# Patient Record
Sex: Female | Born: 1935 | Race: White | Hispanic: No | Smoking: Never smoker
Health system: Southern US, Community
[De-identification: ages and names within clinical notes are randomized; demographics above are authoritative.]

## PROBLEM LIST (undated history)

## (undated) DIAGNOSIS — K2270 Barrett's esophagus without dysplasia: Secondary | ICD-10-CM

## (undated) DIAGNOSIS — M1990 Unspecified osteoarthritis, unspecified site: Secondary | ICD-10-CM

## (undated) DIAGNOSIS — K219 Gastro-esophageal reflux disease without esophagitis: Secondary | ICD-10-CM

## (undated) DIAGNOSIS — E785 Hyperlipidemia, unspecified: Secondary | ICD-10-CM

## (undated) DIAGNOSIS — N809 Endometriosis, unspecified: Secondary | ICD-10-CM

## (undated) DIAGNOSIS — Z8619 Personal history of other infectious and parasitic diseases: Secondary | ICD-10-CM

## (undated) DIAGNOSIS — I10 Essential (primary) hypertension: Secondary | ICD-10-CM

## (undated) DIAGNOSIS — J449 Chronic obstructive pulmonary disease, unspecified: Secondary | ICD-10-CM

## (undated) HISTORY — PX: NASAL SEPTUM SURGERY: SHX37

## (undated) HISTORY — DX: Endometriosis, unspecified: N80.9

## (undated) HISTORY — DX: Barrett's esophagus without dysplasia: K22.70

## (undated) HISTORY — DX: Unspecified osteoarthritis, unspecified site: M19.90

## (undated) HISTORY — PX: VESICOVAGINAL FISTULA CLOSURE W/ TAH: SUR271

## (undated) HISTORY — DX: Gastro-esophageal reflux disease without esophagitis: K21.9

## (undated) HISTORY — DX: Personal history of other infectious and parasitic diseases: Z86.19

## (undated) HISTORY — PX: TRACHEOSTOMY: SUR1362

## (undated) HISTORY — DX: Hyperlipidemia, unspecified: E78.5

## (undated) HISTORY — DX: Chronic obstructive pulmonary disease, unspecified: J44.9

## (undated) HISTORY — DX: Essential (primary) hypertension: I10

## (undated) HISTORY — PX: APPENDECTOMY: SHX54

---

## 1977-08-29 HISTORY — PX: ABDOMINAL HYSTERECTOMY: SHX81

## 1993-08-29 HISTORY — PX: CHOLECYSTECTOMY: SHX55

## 1998-07-22 ENCOUNTER — Ambulatory Visit (HOSPITAL_COMMUNITY): Admission: RE | Admit: 1998-07-22 | Discharge: 1998-07-22 | Payer: Self-pay | Admitting: Family Medicine

## 1998-07-22 ENCOUNTER — Encounter: Payer: Self-pay | Admitting: Family Medicine

## 1999-09-14 ENCOUNTER — Encounter: Payer: Self-pay | Admitting: Family Medicine

## 1999-09-14 ENCOUNTER — Ambulatory Visit (HOSPITAL_COMMUNITY): Admission: RE | Admit: 1999-09-14 | Discharge: 1999-09-14 | Payer: Self-pay | Admitting: Family Medicine

## 2000-10-03 ENCOUNTER — Encounter: Payer: Self-pay | Admitting: Family Medicine

## 2000-10-03 ENCOUNTER — Ambulatory Visit (HOSPITAL_COMMUNITY): Admission: RE | Admit: 2000-10-03 | Discharge: 2000-10-03 | Payer: Self-pay | Admitting: Family Medicine

## 2000-12-26 ENCOUNTER — Encounter: Admission: RE | Admit: 2000-12-26 | Discharge: 2000-12-26 | Payer: Self-pay | Admitting: Family Medicine

## 2000-12-26 ENCOUNTER — Encounter: Payer: Self-pay | Admitting: Family Medicine

## 2001-10-16 ENCOUNTER — Ambulatory Visit (HOSPITAL_COMMUNITY): Admission: RE | Admit: 2001-10-16 | Discharge: 2001-10-16 | Payer: Self-pay | Admitting: Family Medicine

## 2001-10-16 ENCOUNTER — Encounter: Payer: Self-pay | Admitting: Family Medicine

## 2001-11-07 ENCOUNTER — Encounter (INDEPENDENT_AMBULATORY_CARE_PROVIDER_SITE_OTHER): Payer: Self-pay | Admitting: Specialist

## 2001-11-07 ENCOUNTER — Ambulatory Visit (HOSPITAL_COMMUNITY): Admission: RE | Admit: 2001-11-07 | Discharge: 2001-11-07 | Payer: Self-pay | Admitting: Gastroenterology

## 2002-05-07 ENCOUNTER — Ambulatory Visit (HOSPITAL_COMMUNITY): Admission: RE | Admit: 2002-05-07 | Discharge: 2002-05-07 | Payer: Self-pay | Admitting: Gastroenterology

## 2002-05-07 ENCOUNTER — Encounter (INDEPENDENT_AMBULATORY_CARE_PROVIDER_SITE_OTHER): Payer: Self-pay | Admitting: Specialist

## 2002-10-29 ENCOUNTER — Encounter: Payer: Self-pay | Admitting: Family Medicine

## 2002-10-29 ENCOUNTER — Ambulatory Visit (HOSPITAL_COMMUNITY): Admission: RE | Admit: 2002-10-29 | Discharge: 2002-10-29 | Payer: Self-pay | Admitting: Family Medicine

## 2003-11-05 ENCOUNTER — Ambulatory Visit (HOSPITAL_COMMUNITY): Admission: RE | Admit: 2003-11-05 | Discharge: 2003-11-05 | Payer: Self-pay | Admitting: Family Medicine

## 2004-10-04 ENCOUNTER — Ambulatory Visit: Payer: Self-pay | Admitting: Internal Medicine

## 2004-11-08 ENCOUNTER — Ambulatory Visit (HOSPITAL_COMMUNITY): Admission: RE | Admit: 2004-11-08 | Discharge: 2004-11-08 | Payer: Self-pay | Admitting: Family Medicine

## 2005-03-23 ENCOUNTER — Ambulatory Visit: Payer: Self-pay | Admitting: Internal Medicine

## 2005-09-05 ENCOUNTER — Ambulatory Visit: Payer: Self-pay | Admitting: Internal Medicine

## 2005-10-04 ENCOUNTER — Ambulatory Visit: Payer: Self-pay | Admitting: Internal Medicine

## 2005-11-10 ENCOUNTER — Ambulatory Visit (HOSPITAL_COMMUNITY): Admission: RE | Admit: 2005-11-10 | Discharge: 2005-11-10 | Payer: Self-pay | Admitting: Family Medicine

## 2006-05-05 ENCOUNTER — Ambulatory Visit: Payer: Self-pay | Admitting: Internal Medicine

## 2006-09-01 ENCOUNTER — Ambulatory Visit: Payer: Self-pay | Admitting: Internal Medicine

## 2006-09-04 ENCOUNTER — Ambulatory Visit: Payer: Self-pay | Admitting: Internal Medicine

## 2006-09-11 ENCOUNTER — Ambulatory Visit: Payer: Self-pay | Admitting: Internal Medicine

## 2006-10-20 ENCOUNTER — Ambulatory Visit: Payer: Self-pay | Admitting: Internal Medicine

## 2006-11-13 ENCOUNTER — Ambulatory Visit (HOSPITAL_COMMUNITY): Admission: RE | Admit: 2006-11-13 | Discharge: 2006-11-13 | Payer: Self-pay | Admitting: Internal Medicine

## 2007-04-12 ENCOUNTER — Ambulatory Visit: Payer: Self-pay | Admitting: Internal Medicine

## 2007-04-16 ENCOUNTER — Ambulatory Visit: Payer: Self-pay | Admitting: Internal Medicine

## 2007-09-03 ENCOUNTER — Ambulatory Visit: Payer: Self-pay | Admitting: Internal Medicine

## 2007-09-03 ENCOUNTER — Encounter: Payer: Self-pay | Admitting: Internal Medicine

## 2007-09-03 DIAGNOSIS — J3089 Other allergic rhinitis: Secondary | ICD-10-CM

## 2007-09-03 DIAGNOSIS — E785 Hyperlipidemia, unspecified: Secondary | ICD-10-CM

## 2007-09-03 DIAGNOSIS — J4530 Mild persistent asthma, uncomplicated: Secondary | ICD-10-CM

## 2007-09-03 DIAGNOSIS — I10 Essential (primary) hypertension: Secondary | ICD-10-CM | POA: Insufficient documentation

## 2007-09-03 DIAGNOSIS — K2270 Barrett's esophagus without dysplasia: Secondary | ICD-10-CM

## 2007-09-03 DIAGNOSIS — K219 Gastro-esophageal reflux disease without esophagitis: Secondary | ICD-10-CM | POA: Insufficient documentation

## 2007-09-03 DIAGNOSIS — J302 Other seasonal allergic rhinitis: Secondary | ICD-10-CM | POA: Insufficient documentation

## 2007-09-03 DIAGNOSIS — M810 Age-related osteoporosis without current pathological fracture: Secondary | ICD-10-CM

## 2007-09-12 ENCOUNTER — Ambulatory Visit: Payer: Self-pay | Admitting: Internal Medicine

## 2007-10-02 ENCOUNTER — Ambulatory Visit: Payer: Self-pay | Admitting: Internal Medicine

## 2007-11-19 ENCOUNTER — Ambulatory Visit (HOSPITAL_COMMUNITY): Admission: RE | Admit: 2007-11-19 | Discharge: 2007-11-19 | Payer: Self-pay | Admitting: Internal Medicine

## 2008-01-24 ENCOUNTER — Ambulatory Visit: Payer: Self-pay | Admitting: Internal Medicine

## 2008-03-10 ENCOUNTER — Ambulatory Visit: Payer: Self-pay | Admitting: Internal Medicine

## 2008-09-11 ENCOUNTER — Ambulatory Visit: Payer: Self-pay | Admitting: Internal Medicine

## 2008-09-16 ENCOUNTER — Ambulatory Visit: Payer: Self-pay | Admitting: Internal Medicine

## 2008-09-16 DIAGNOSIS — D539 Nutritional anemia, unspecified: Secondary | ICD-10-CM | POA: Insufficient documentation

## 2008-09-18 ENCOUNTER — Ambulatory Visit: Payer: Self-pay | Admitting: Family Medicine

## 2008-09-18 ENCOUNTER — Encounter: Payer: Self-pay | Admitting: Internal Medicine

## 2008-09-24 ENCOUNTER — Ambulatory Visit: Payer: Self-pay | Admitting: Internal Medicine

## 2008-11-04 ENCOUNTER — Ambulatory Visit: Payer: Self-pay | Admitting: Internal Medicine

## 2008-11-04 ENCOUNTER — Telehealth (INDEPENDENT_AMBULATORY_CARE_PROVIDER_SITE_OTHER): Payer: Self-pay | Admitting: *Deleted

## 2008-11-04 DIAGNOSIS — B029 Zoster without complications: Secondary | ICD-10-CM | POA: Insufficient documentation

## 2008-11-20 ENCOUNTER — Ambulatory Visit: Payer: Self-pay | Admitting: Internal Medicine

## 2008-11-20 DIAGNOSIS — R21 Rash and other nonspecific skin eruption: Secondary | ICD-10-CM | POA: Insufficient documentation

## 2008-11-20 DIAGNOSIS — B0229 Other postherpetic nervous system involvement: Secondary | ICD-10-CM

## 2008-12-18 ENCOUNTER — Ambulatory Visit (HOSPITAL_COMMUNITY): Admission: RE | Admit: 2008-12-18 | Discharge: 2008-12-18 | Payer: Self-pay | Admitting: Internal Medicine

## 2009-03-12 ENCOUNTER — Ambulatory Visit: Payer: Self-pay | Admitting: Internal Medicine

## 2009-07-27 ENCOUNTER — Telehealth (INDEPENDENT_AMBULATORY_CARE_PROVIDER_SITE_OTHER): Payer: Self-pay | Admitting: *Deleted

## 2009-07-28 ENCOUNTER — Ambulatory Visit: Payer: Self-pay | Admitting: Internal Medicine

## 2009-09-11 ENCOUNTER — Ambulatory Visit: Payer: Self-pay | Admitting: Internal Medicine

## 2009-09-14 ENCOUNTER — Ambulatory Visit: Payer: Self-pay | Admitting: Internal Medicine

## 2009-09-21 ENCOUNTER — Ambulatory Visit: Payer: Self-pay | Admitting: Internal Medicine

## 2009-09-21 DIAGNOSIS — E559 Vitamin D deficiency, unspecified: Secondary | ICD-10-CM

## 2010-03-15 ENCOUNTER — Ambulatory Visit: Payer: Self-pay | Admitting: Internal Medicine

## 2010-07-15 ENCOUNTER — Telehealth: Payer: Self-pay | Admitting: Internal Medicine

## 2010-08-10 ENCOUNTER — Telehealth: Payer: Self-pay | Admitting: Internal Medicine

## 2010-08-27 ENCOUNTER — Ambulatory Visit: Payer: Self-pay | Admitting: Internal Medicine

## 2010-09-10 ENCOUNTER — Ambulatory Visit
Admission: RE | Admit: 2010-09-10 | Discharge: 2010-09-10 | Payer: Self-pay | Source: Home / Self Care | Attending: Internal Medicine | Admitting: Internal Medicine

## 2010-09-26 LAB — CONVERTED CEMR LAB
ALT: 17 units/L (ref 0–35)
ALT: 18 units/L (ref 0–35)
AST: 23 units/L (ref 0–37)
Albumin: 4 g/dL (ref 3.5–5.2)
Albumin: 4.3 g/dL (ref 3.5–5.2)
Alkaline Phosphatase: 59 units/L (ref 39–117)
Alkaline Phosphatase: 60 units/L (ref 39–117)
Basophils Absolute: 0.1 10*3/uL (ref 0.0–0.1)
Basophils Absolute: 0.1 10*3/uL (ref 0.0–0.1)
Bilirubin Urine: NEGATIVE
Bilirubin Urine: NEGATIVE
Bilirubin Urine: NEGATIVE
Bilirubin, Direct: 0.1 mg/dL (ref 0.0–0.3)
Bilirubin, Direct: 0.1 mg/dL (ref 0.0–0.3)
Bilirubin, Direct: 0.2 mg/dL (ref 0.0–0.3)
CO2: 30 meq/L (ref 19–32)
CO2: 31 meq/L (ref 19–32)
CO2: 32 meq/L (ref 19–32)
Calcium: 9 mg/dL (ref 8.4–10.5)
Chloride: 101 meq/L (ref 96–112)
Chloride: 102 meq/L (ref 96–112)
Chloride: 104 meq/L (ref 96–112)
Creatinine, Ser: 0.7 mg/dL (ref 0.4–1.2)
Direct LDL: 142.2 mg/dL
Direct LDL: 160.2 mg/dL
Eosinophils Absolute: 0.4 10*3/uL (ref 0.0–0.6)
Eosinophils Relative: 7.8 % — ABNORMAL HIGH (ref 0.0–5.0)
Eosinophils Relative: 8.5 % — ABNORMAL HIGH (ref 0.0–5.0)
Folate: >20 ng/mL
GFR calc Af Amer: 127 mL/min
GFR calc non Af Amer: 88 mL/min
Glucose, Bld: 106 mg/dL — ABNORMAL HIGH (ref 70–99)
HCT: 38 % (ref 36.0–46.0)
HCT: 40.9 % (ref 36.0–46.0)
HDL: 42.6 mg/dL (ref >39.0)
HDL: 47.1 mg/dL (ref >39.0)
HDL: 53.7 mg/dL (ref >39.00)
Hemoglobin, Urine: NEGATIVE
Hemoglobin: 13.4 g/dL (ref 12.0–15.0)
Hemoglobin: 13.8 g/dL (ref 12.0–15.0)
Ketones, ur: NEGATIVE mg/dL
Leukocytes, UA: NEGATIVE
Lymphocytes Relative: 38.4 % (ref 12.0–46.0)
Lymphocytes Relative: 43 % (ref 12.0–46.0)
Lymphs Abs: 1.9 10*3/uL (ref 0.7–4.0)
MCHC: 32.9 g/dL (ref 30.0–36.0)
MCV: 100.7 fL — ABNORMAL HIGH (ref 78.0–100.0)
MCV: 101 fL — ABNORMAL HIGH (ref 78.0–100.0)
MCV: 103.8 fL — ABNORMAL HIGH (ref 78.0–100.0)
Monocytes Absolute: 0.4 10*3/uL (ref 0.1–1.0)
Neutro Abs: 2 10*3/uL (ref 1.4–7.7)
Neutro Abs: 2 10*3/uL (ref 1.4–7.7)
Neutrophils Relative %: 46.5 % (ref 43.0–77.0)
Nitrite: NEGATIVE
Platelets: 246 10*3/uL (ref 150–400)
Platelets: 255 10*3/uL (ref 150–400)
RBC: 3.94 M/uL (ref 3.87–5.11)
RDW: 11.9 % (ref 11.5–14.6)
RDW: 12.1 % (ref 11.5–14.6)
Specific Gravity, Urine: 1.015 (ref 1.000–1.03)
TSH: 1.15 microintl units/mL (ref 0.35–5.50)
TSH: 1.5 microintl units/mL (ref 0.35–5.50)
Total Bilirubin: 1.1 mg/dL (ref 0.3–1.2)
Total CHOL/HDL Ratio: 4.8
Total Protein, Urine: NEGATIVE mg/dL
Total Protein: 7.3 g/dL (ref 6.0–8.3)
Triglycerides: 205 mg/dL — ABNORMAL HIGH (ref 0.0–149.0)
Urine Glucose: NEGATIVE mg/dL
Urine Glucose: NEGATIVE mg/dL
Urobilinogen, UA: 0.2 (ref 0.0–1.0)
Urobilinogen, UA: 0.2 (ref 0.0–1.0)
VLDL: 43 mg/dL — ABNORMAL HIGH (ref 0–40)
Vit D, 25-Hydroxy: 36 ng/mL (ref 30–89)
WBC: 5 10*3/uL (ref 4.5–10.5)
pH: 5 (ref 5.0–8.0)
pH: 5 (ref 5.0–8.0)

## 2010-09-30 NOTE — Progress Notes (Signed)
Summary: head congestion  Phone Note Call from Patient Call back at Home Phone [phone]   Caller: Patient Call For: young Summary of Call: Pt c/o left eye dripping, stuffy nose, hoarse since Tues wants something called in, pls advise.//[facility] [address] Initial call taken by: Darletta Moll,  July 15, 2010 10:14 AM  Follow-up for Phone Call        Pt stats she thinks she has "hayfever" Sh ec/o sneezing, head congestion, runny nose with clear mucus, left eye is watery. Pt denies fever. She has tried benadryl, [facility] brand sinus and allergy without relief. Please advise. Carron Curie CMA  July 15, 2010 10:47 AM allergies: altace, lipitor, boniva, prednisone  Additional Follow-up for Phone Call Additional follow up Details #1::        Per CDY-give Medrol 8mg  #6 take 2 x 2 days, 1 x 2 days, then stop no refills.Reynaldo Minium CMA  July 15, 2010 11:45 AM   rx sent. pt aware. Carron Curie CMA  July 15, 2010 12:06 PM     New/Updated Medications: MEDROL 8 MG TABS (METHYLPREDNISOLONE) 2 tablets x 2 days, then one tablet x 2 days then stop Prescriptions: MEDROL 8 MG TABS (METHYLPREDNISOLONE) 2 tablets x 2 days, then one tablet x 2 days then stop  #6 x 0   Entered by:   Carron Curie CMA   Authorized by:   Waymon Budge MD   Signed by:   Carron Curie CMA on 07/15/2010   Method used:   Electronically to        [facility]. [phone]* (retail)       [address]       [place], [place]  [address]       Ph: [phone]       Fax: [phone]   RxID:   [id number]

## 2010-09-30 NOTE — Assessment & Plan Note (Signed)
Summary: CPX/MEDICARE/#/CD   Vital Signs:  Patient profile:   76 year old female Height:      61 inches Weight:      139 pounds BMI:     26.36 O2 Sat:      96 % on Room air Temp:     98.1 degrees F oral Pulse rate:   71 / minute BP sitting:   160 / 90  (left arm) Cuff size:   regular  Vitals Entered ByZella Ball Ewing (September 21, 2009 1:08 PM)  O2 Flow:  Room air  Preventive Care Screening  Bone Density:    Date:  09/01/2008    Next Due:  08/2010    Results:  abnormal std dev  CC: adult physical/RE   Primary Care Provider:  Oliver Barre  CC:  adult physical/RE.  History of Present Illness: BP elev today adn last wk at her allergy appt;  Pt denies CP, sob, doe, wheezing, orthopnea, pnd, worsening LE edema, palps, dizziness or syncope  Pt denies new neuro symptoms such as headache, facial or extremity weakness   Did see optho in dec 2010 and rec'd fish oil;  but admits some dietary noncompliance;  wt today stable; no OSA symtpoms, still works every other wekeend at admissions at [employer]; sees allergy for yearly visit;  still has some burning stinging and itching to the left post armsince the shinlges;    Problems Prior to Update: 1)  Rash-nonvesicular  (ICD-782.1) 2)  Postherpetic Neuralgia  (ICD-053.19) 3)  Herpes Zoster  (ICD-053.9) 4)  Anemia, Macrocytic  (ICD-281.9) 5)  Preventive Health Care  (ICD-V70.0) 6)  COPD  (ICD-496) 7)  Preventive Health Care  (ICD-V70.0) 8)  Hypertension  (ICD-401.9) 9)  Osteoporosis  (ICD-733.00) 10)  Hyperlipidemia  (ICD-272.4) 11)  Asthma  (ICD-493.90) 12)  Allergic Rhinitis  (ICD-477.9) 13)  Barretts Esophagus  (ICD-530.85) 14)  Gerd  (ICD-530.81)  Medications Prior to Update: 1)  Hydrochlorothiazide 25 Mg  Tabs (Hydrochlorothiazide) .... Take 1 Tab By Mouth Every Morning 2)  Ecotrin Low Strength 81 Mg  Tbec (Aspirin) .Marland Kitchen.. 1 By Mouth Qd 3)  Proventil Hfa 108 (90 Base) Mcg/act Aers (Albuterol Sulfate) .... 2 Puffs Four Times A Day Prn 4)   Allergy Vaccine Vaccine   1:10   Go 0.35  (W-E) 5)  Epipen 0.3 Mg/0.51ml Devi (Epinephrine) .... For Severe Allergic Reaction 6)  Flunisolide 29 Mcg/act Soln (Flunisolide) .... 2 Spray/side Once Daily 7)  Alendronate Sodium 70 Mg Tabs (Alendronate Sodium) .Marland Kitchen.. 1 By Mouth Q Wk 8)  Amitriptyline Hcl 25 Mg Tabs (Amitriptyline Hcl) .Marland Kitchen.. 1 By Mouth At Bedtime As Needed 9)  Fish Oil Maximum Strength 1200 Mg Caps (Omega-3 Fatty Acids) .... Once Daily 10)  Amoxicillin 500 Mg Caps (Amoxicillin) .Marland Kitchen.. 1 Three Times A Day  Current Medications (verified): 1)  Hydrochlorothiazide 25 Mg  Tabs (Hydrochlorothiazide) .... Take 1 Tab By Mouth Every Morning 2)  Ecotrin Low Strength 81 Mg  Tbec (Aspirin) .Marland Kitchen.. 1 By Mouth Qd 3)  Proventil Hfa 108 (90 Base) Mcg/act Aers (Albuterol Sulfate) .... 2 Puffs Four Times A Day Prn 4)  Allergy Vaccine Vaccine   1:10   Go 0.35  (W-E) 5)  Epipen 0.3 Mg/0.72ml Devi (Epinephrine) .... For Severe Allergic Reaction 6)  Flunisolide 29 Mcg/act Soln (Flunisolide) .... 2 Spray/side Once Daily 7)  Alendronate Sodium 70 Mg Tabs (Alendronate Sodium) .Marland Kitchen.. 1 By Mouth Q Wk 8)  Amitriptyline Hcl 25 Mg Tabs (Amitriptyline Hcl) .Marland Kitchen.. 1 By Mouth  At Bedtime As Needed 9)  Fish Oil Maximum Strength 1200 Mg Caps (Omega-3 Fatty Acids) .... Once Daily 10)  Amoxicillin 500 Mg Caps (Amoxicillin) .Marland Kitchen.. 1 Three Times A Day 11)  Losartan Potassium 100 Mg Tabs (Losartan Potassium) .Marland Kitchen.. 1po Once Daily  Allergies (verified): 1)  ! * Altace 2)  ! Prednisone 3)  ! 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 Never Smoked Alcohol use-yes  Risk Factors: Smoking Status: never (09/03/2007)  Past Medical History: GERD Barrett's esophagus Allergic rhinitis Asthma Hyperlipidemia Osteoporosis hand djd Hypertension COPD Hx shingles left upper arm/ axilla vit d deficiency  Review of Systems  The patient denies anorexia, fever, weight loss, weight gain, vision loss, decreased hearing, hoarseness, chest pain, syncope, dyspnea on exertion, peripheral edema, prolonged cough, headaches, hemoptysis, abdominal pain, melena, hematochezia, severe indigestion/heartburn, hematuria, incontinence, muscle weakness, suspicious skin lesions, transient blindness, difficulty walking, depression, unusual weight change, abnormal bleeding, enlarged lymph nodes, and angioedema.         all otherwise negative per pt -  Physical Exam  General:  alert and overweight-appearing.   Head:  normocephalic and atraumatic.   Eyes:  vision grossly intact, pupils equal, and pupils round.   Ears:  R ear normal and L ear normal.   Nose:  no external deformity and no nasal discharge.   Mouth:  no gingival abnormalities and pharynx pink and moist.   Neck:  supple and no masses.   Lungs:  normal respiratory effort and normal breath sounds.   Heart:  normal rate and regular rhythm.   Abdomen:  soft, non-tender, and normal bowel sounds.   Msk:  no joint tenderness and no joint swelling.   Extremities:  no edema, no erythema  Neurologic:  cranial nerves II-XII intact and strength normal in all extremities.     Impression & Recommendations:  Problem # 1:  Preventive Health Care (ICD-V70.0)  Overall doing well, age appropriate education and counseling updated and referral for appropriate preventive services done unless declined, immunizations up to date or declined, diet counseling done if overweight, urged to quit smoking if  smokes , most recent labs reviewed and current ordered if appropriate, ecg reviewed or declined (interpretation per ECG scanned in the EMR if done); information regarding Medicare Prevention requirements given if appropriate   Orders: EKG w/ Interpretation (93000) TLB-BMP (Basic Metabolic Panel-BMET) (80048-METABOL) TLB-CBC Platelet - w/Differential (85025-CBCD) TLB-Hepatic/Liver Function Pnl (80076-HEPATIC) TLB-Lipid Panel (80061-LIPID) TLB-TSH (Thyroid Stimulating Hormone) (84443-TSH) TLB-Udip ONLY (81003-UDIP) T-Vitamin D (25-Hydroxy) ([address]-60454)  Problem # 2:  HYPERTENSION (ICD-401.9)  Her updated medication list for this problem includes:    Hydrochlorothiazide 25 Mg Tabs (Hydrochlorothiazide) .Marland Kitchen... Take 1 tab by mouth every morning    Losartan Potassium 100 Mg Tabs (Losartan potassium) .Marland Kitchen... 1po once daily add the losartan 100 once daily  Problem # 3:  HYPERLIPIDEMIA (ICD-272.4)  Labs Reviewed: SGOT: 23 (09/16/2008)   SGPT: 17 (09/16/2008)   HDL:47.1 (09/16/2008), 42.6 (09/03/2007)  LDL:DEL (09/16/2008), DEL (09/03/2007)  Chol:226 (09/16/2008), 216 (09/03/2007)  Trig:221 (09/16/2008), 214 (09/03/2007)  d/w pt - goal ldl less than 100 - declines statin at this time; Pt to continue diet efforts, good med tolerance;- goal LDL less than 100  Complete Medication List: 1)  Hydrochlorothiazide 25 Mg Tabs (Hydrochlorothiazide) .... Take 1 tab by mouth every morning 2)  Ecotrin Low Strength 81 Mg Tbec (Aspirin) .Marland Kitchen.. 1 by mouth qd 3)  Proventil Hfa 108 (90 Base) Mcg/act Aers (Albuterol sulfate) .... 2 puffs four times a day prn 4)  Allergy Vaccine Vaccine 1:10 Go 0.35 (w-e)  5)  Epipen 0.3 Mg/0.83ml Devi (Epinephrine) .... For severe allergic reaction 6)  Flunisolide 29 Mcg/act Soln (Flunisolide) .... 2 spray/side once daily 7)  Alendronate Sodium 70 Mg Tabs (Alendronate sodium) .Marland Kitchen.. 1 by mouth q wk 8)  Amitriptyline Hcl 25 Mg Tabs (Amitriptyline hcl) .Marland Kitchen.. 1 by mouth at bedtime as  needed 9)  Fish Oil Maximum Strength 1200 Mg Caps (Omega-3 fatty acids) .... Once daily 10)  Amoxicillin 500 Mg Caps (Amoxicillin) .Marland Kitchen.. 1 three times a day 11)  Losartan Potassium 100 Mg Tabs (Losartan potassium) .Marland Kitchen.. 1po once daily  Patient Instructions: 1)  start the vit d 1000 units per day (OTC) 2)  Please go to the Lab in the basement for your blood and/or urine tests today 3)  please follow lower cholesterol diet 4)  Please take all new medications as prescribed - the losartan 100 mg per day 5)  Continue all previous medications as before this visit  6)  Please schedule a follow-up appointment in 6 months with: 7)  Lipid Panel prior to visit, ICD-9: 272.0 8)  Check your Blood Pressure regularly. If it is above 140/90: you should make an appointment sooner Prescriptions: LOSARTAN POTASSIUM 100 MG TABS (LOSARTAN POTASSIUM) 1po once daily  #90 x 3   Entered and Authorized by:   Corwin Levins MD   Signed by:   Corwin Levins MD on 09/21/2009   Method used:   Electronically to        [facility]. [phone]* (retail)       [address]       [place], [place]  [address]       Ph: [phone]       Fax: [phone]   RxID:   [id number]

## 2010-09-30 NOTE — Progress Notes (Signed)
Summary: medication refill  Phone Note From Pharmacy   Caller: [facility]. #[id number]* Summary of Call: Patient requesting a prescription for Omeprazole that was removed from medication list 01/24/2008.  Initial call taken by: Robin Ewing CMA Duncan Dull),  August 10, 2010 10:26 AM  Follow-up for Phone Call        ok - to robin to handle Follow-up by: Corwin Levins MD,  August 10, 2010 1:08 PM    New/Updated Medications: OMEPRAZOLE 40 MG CPDR (OMEPRAZOLE) 1 by mouth once daily Prescriptions: OMEPRAZOLE 40 MG CPDR (OMEPRAZOLE) 1 by mouth once daily  #30 x 1   Entered by:   Scharlene Gloss CMA (AAMA)   Authorized by:   Corwin Levins MD   Signed by:   Scharlene Gloss CMA (AAMA) on 08/10/2010   Method used:   Faxed to ...       [facility]. [phone]* (retail)       [address]       [place], [place]  [address]       Ph: [phone]       Fax: [phone]   RxID:   [id number]

## 2010-09-30 NOTE — Assessment & Plan Note (Signed)
Summary: 12 month/apc   Primary Provider/Referring Provider:  Oliver Barre   History of Present Illness:  08/1408- Allergic rhinitis, asthma, GERD Had resolved bronchitis illness in May. CXR then suggested emphysema - she never smoked  but had emphysema. Had membranous croup age 76 requiring tracheostomy. She denies dyspnea, does mow grass, cleans house. Got both flu vax.  July 28, 2009- Allergic rhinitis, asthma, GERD Continues allergy vaccine without problems. Says raking leaves and going into attic for holidays have increased itching drainage from left eye and coughed up some white thick mucus for 2-3 weeks, with no fever. Throat seemd a little raw yesterday- resolved after gargle. Today right maxillary area hurt- flushed out dark "attic dust" with saline rinse. Has been doing steroid nasal spray intermittently. She remembers steroid inj helping a lot. Recent shingles left back treated by Dr Jonny Ruiz. Had flu vax and pneumovax this year.  September 11, 2009- Allergic rhinitis, asthma, GERD She says she is doing "splendid" since last here and doing fine. Coughs up small mucus plugs occasionally. Denies wheeze, nasal congestion or sinus pressure. Rare need for antibiotic but likes to keep it available.    Allergies: 1)  ! * Altace 2)  ! Prednisone 3)  ! 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    Vital Signs:  Patient profile:   76 year old female Weight:      142 pounds BMI:     26.49 O2 Sat:      97 % on Room air Pulse rate:   77 / minute BP sitting:   150 / 90  (left arm) Cuff size:   regular  Vitals Entered By: Clarise Cruz Duncan Dull) (September 11, 2009 9:11 AM)  O2 Flow:  Room air  Physical Exam  Additional Exam:  General: A/Ox3; pleasant and cooperative, NAD, SKIN: no rash, lesions NODES: no lymphadenopathy HEENT: [place]/AT, EOM- WNL, Conjuctivae- clear- no increased injection, PERRLA, TM-WNL, Nose- clear, Throat- clear and wnl, [place]  II NECK: Supple w/ fair ROM, JVD- none, normal carotid impulses w/o bruits Thyroid- normal to palpation, old tracheostomy scar CHEST: Clear to P&A HEART: RRR, no m/g/r heard ABDOMEN: Soft and nl; QIO:NGEX, nl pulses, no edema  NEURO: Grossly intact to observation      Impression & Recommendations:  Problem # 1:  COPD (ICD-496) Doing very well with no active issues for now. meds were discussed and refilled.  Problem # 2:  ALLERGIC RHINITIS (ICD-477.9)  Good control. We are refilling epipen and discussed risk/ benefit. Her updated medication list for this problem includes:    Flunisolide 29  Mcg/act Soln (Flunisolide) .Marland Kitchen... 2 spray/side once daily  Medications Added to Medication List This Visit: 1)  Epipen 0.3 Mg/0.12ml Devi (Epinephrine) .... For severe allergic reaction 2)  Fish Oil Maximum Strength 1200 Mg Caps (Omega-3 fatty acids) .... Once daily 3)  Amoxicillin 500  Mg Caps (Amoxicillin) .Marland Kitchen.. 1 three times a day  Other Orders: Est. Patient Level III ([address])  Patient Instructions: 1)  Schedule return in one year, earlier if needed 2)  Meds refilled- scripts to drug store Prescriptions: AMOXICILLIN 500 MG CAPS (AMOXICILLIN) 1 three times a day  #21 x 1   Entered and Authorized by:   Waymon Budge MD   Signed by:   Waymon Budge MD on 09/11/2009   Method used:   Electronically to        [facility]. [phone]* (retail)       [address]       [place], [place]  [address]       Ph: [phone]       Fax: [phone]   RxID:   [id number] FLUNISOLIDE 29 MCG/ACT SOLN (FLUNISOLIDE) 2 spray/side once daily  #1 x 11   Entered and Authorized by:   Waymon Budge MD   Signed by:   Waymon Budge MD on 09/11/2009   Method used:   Electronically to        [facility]. [phone]* (retail)       [address]       [place], [place]  [address]       Ph: [phone]       Fax: [phone]   RxID:   [id number] EPIPEN 0.3 MG/0.3ML DEVI (EPINEPHRINE) For severe allergic reaction  #1 x prn   Entered and Authorized by:   Waymon Budge MD   Signed by:   Waymon Budge MD on 09/11/2009   Method used:   Electronically to        [facility]. [phone]* (retail)       [address]       [place], [place]  [address]       Ph: [phone]       Fax: [phone]   RxID:   [id number] PROVENTIL HFA 108 (90 BASE) MCG/ACT AERS (ALBUTEROL SULFATE) 2 puffs four times a day prn  #1 x prn   Entered and Authorized by:   Waymon Budge MD   Signed by:   Waymon Budge MD on 09/11/2009   Method used:   Electronically to        [facility]. [phone]* (retail)       [address]       [place], [place]  [address]       Ph: [phone]       Fax: [phone]   RxID:   [id number] AMOXICILLIN 500  MG CAPS (AMOXICILLIN) 1 three times a day  #21 x 1   Entered and Authorized by:   Waymon Budge MD   Signed by:   Waymon Budge MD on 09/11/2009   Method used:   Print then Give to Patient   RxID:   [id number] EPIPEN 0.3 MG/0.3ML DEVI (EPINEPHRINE) For severe allergic reaction  #1 x prn   Entered and  Authorized by:   Waymon Budge MD   Signed by:   Waymon Budge MD on 09/11/2009   Method used:   Print then Give to Patient   RxID:   [id number]   Prevention & Chronic Care Immunizations   Influenza vaccine: given  (04/29/2008)    Tetanus booster: 09/16/2008: Tdap    Pneumococcal vaccine: Pneumovax  (09/16/2008)   Pneumococcal vaccine due: 06/2007    H. zoster vaccine: Not documented  Colorectal Screening   Hemoccult: Not documented    Colonoscopy: normal  (08/29/2001)   Colonoscopy due: 08/2011  Other Screening   Pap smear: Not documented    Mammogram: ASSESSMENT: Negative - BI-RADS 1^MM DIGITAL SCREENING  (12/18/2008)    DXA bone density scan: abnormal  (09/19/2006)   DXA scan due: 09/2008    Smoking status: never  (09/03/2007)  Lipids   Total Cholesterol: 226  (09/16/2008)   LDL: DEL  (09/16/2008)   LDL Direct: 142.2  (09/16/2008)   HDL: 47.1  (09/16/2008)   Triglycerides: 221  (09/16/2008)    SGOT (AST): 23  (09/16/2008)   SGPT (ALT): 17  (09/16/2008)   Alkaline phosphatase: 59  (09/16/2008)   Total bilirubin: 1.1  (09/16/2008)  Hypertension   Last Blood Pressure: 150 / 90  (09/11/2009)   Serum creatinine: 0.7  (09/16/2008)   Serum potassium 4.0  (09/16/2008)  Self-Management Support :    Hypertension self-management support: Not documented    Lipid self-management support: Not documented

## 2010-09-30 NOTE — Assessment & Plan Note (Signed)
Summary: 12 months/apc   Primary Provider/Referring Provider:  Oliver Barre  CC:  Yearly follow up visit-allergies..  History of Present Illness: July 28, 2009- Allergic rhinitis, asthma, GERD Continues allergy vaccine without problems. Says raking leaves and going into attic for holidays have increased itching drainage from left eye and coughed up some white thick mucus for 2-3 weeks, with no fever. Throat seemd a little raw yesterday- resolved after gargle. Today right maxillary area hurt- flushed out dark "attic dust" with saline rinse. Has been doing steroid nasal spray intermittently. She remembers steroid inj helping a lot. Recent shingles left back treated by Dr Jonny Ruiz. Had flu vax and pneumovax this year.  September 11, 2009- Allergic rhinitis, asthma, GERD She says she is doing "splendid" since last here and doing fine. Coughs up small mucus plugs occasionally. Denies wheeze, nasal congestion or sinus pressure. Rare need for antibiotic but likes to keep it available.  September 10, 2010-  Allergic rhinitis, asthma, GERD Nurse-CC: Yearly follow up visit-allergies. She is sure the allergy shots are working well for her.  She continues to give her own shots and we discussed guidance on this, risk benefit and her Epipen. She has never had reactions to her shots. Rarely needs her rescue inhaler. It has helped to have access to amoxacillin for rare use- once in past year.     Asthma History    Initial Asthma Severity Rating:    Age range: 12+ years    Symptoms: 0-2 days/week    Nighttime Awakenings: 0-2/month    Interferes w/ normal activity: no limitations    SABA use (not for EIB): 0-2 days/week    Asthma Severity Assessment: Intermittent   Preventive Screening-Counseling & Management  Alcohol-Tobacco     Smoking Status: never  Current Medications (verified): 1)  Hydrochlorothiazide 25 Mg  Tabs (Hydrochlorothiazide) .... Take 1 Tab By Mouth Every Morning 2)  Ecotrin Low  Strength 81 Mg  Tbec (Aspirin) .Marland Kitchen.. 1 By Mouth Qd 3)  Proventil Hfa 108 (90 Base) Mcg/act Aers (Albuterol Sulfate) .... 2 Puffs Four Times A Day Prn 4)  Allergy Vaccine Vaccine   1:10   Go 0.35  (W-E) 5)  Epipen 0.3 Mg/0.62ml Devi (Epinephrine) .... For Severe Allergic Reaction 6)  Flunisolide 29 Mcg/act Soln (Flunisolide) .... 2 Spray/side Once Daily 7)  Amitriptyline Hcl 25 Mg Tabs (Amitriptyline Hcl) .Marland Kitchen.. 1 By Mouth At Bedtime As Needed 8)  Fish Oil Maximum Strength 1200 Mg Caps (Omega-3 Fatty Acids) .... Once Daily  Allergies (verified): 1)  ! * Altace 2)  ! Prednisone 3)  ! Lipitor 4)  ! Fosamax 5)  * Boniva  Past History:  Past Medical History: Last updated: 09/21/2009 GERD Barrett's esophagus Allergic rhinitis Asthma Hyperlipidemia Osteoporosis hand djd Hypertension COPD Hx shingles left upper arm/ axilla vit d deficiency  Past Surgical History: Last updated: 09/03/2007 Appendectomy Hysterectomy Cholecystectomy s/p deviated septum s/p temp tracheostomy at 76 yo for croup  Family History: Last updated: 02-08-2008 father died with stroke 17 yo mother died with MI at 51 yo m-grandmother lived to 34 yo m-grandfather with "stomach" cancer 9 siblings 1 sister died age 88  hx of rheumatoid arthritis  Social History: Last updated: 09/03/2007 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    Vital Signs:  Patient profile:   76 year  old female Height:      61 inches Weight:      139.25 pounds BMI:     26.41 O2 Sat:      96 % on Room air Pulse rate:   88 / minute BP sitting:   170 / 80  (left arm) Cuff size:   regular  Vitals Entered By: Reynaldo Minium CMA (September 10, 2010 9:19 AM)  O2 Flow:  Room air CC: Yearly follow up visit-allergies.   Physical Exam  Additional Exam:  General: A/Ox3; pleasant and cooperative, NAD, pleasant and comfortable appearing SKIN: no rash, lesions NODES: no lymphadenopathy HEENT: [place]/AT, EOM- WNL, Conjuctivae- clear- no increased injection, PERRLA, TM-WNL, Nose- clear, Throat- clear and wnl, Mallampati   II NECK: Supple w/ fair ROM, JVD- none, normal carotid impulses w/o bruits Thyroid- normal to palpation, old tracheostomy scar CHEST: Clear to P&A, l HEART: RRR, no m/g/r heard ABDOMEN: Soft and nl; GNF:AOZH, nl pulses, no edema  NEURO: Grossly intact to observation      Impression & Recommendations:  Problem # 1:  ALLERGIC RHINITIS (ICD-477.9)  Allergy vaccine and flonase are continued after discussion.  Her updated medication list for this problem includes:    Flunisolide 29 Mcg/act Soln (Flunisolide) .Marland Kitchen... 2 spray/side once daily  Problem # 2:  ASTHMA (ICD-493.90) Mild intermittent with appropriate medication. I have removed previous problem listed COPD since she never smoked. She hasn't had a recent PFT but I don't think it is cost/benefit indicated to do one now.  Ok at her req1uest to have access to an amoxacillin script if needed  Medications Added to Medication List This Visit: 1)  Amoxicillin 500 Mg Caps (Amoxicillin) .Marland Kitchen.. 1 three times a day for infection  Other Orders: Est. Patient Level III (08657)  Patient Instructions: 1)  Please schedule a follow-up appointment in 1 year. Please call sooner as needed.  2)  Continue allergy vaccine 3)  Scripts refilled.  Prescriptions: PROVENTIL HFA 108 (90 BASE) MCG/ACT AERS (ALBUTEROL SULFATE) 2 puffs four times a  day prn  #1 x prn   Entered and Authorized by:   Waymon Budge MD   Signed by:   Waymon Budge MD on 09/10/2010   Method used:   Print then Give to Patient   RxID:   [id number] EPIPEN 0.3 MG/0.3ML DEVI (EPINEPHRINE) For severe allergic reaction  #1 x prn   Entered and Authorized by:   Waymon Budge MD   Signed by:   Waymon Budge MD on 09/10/2010   Method used:   Print then Give to Patient   RxID:   [id number] FLUNISOLIDE 29 MCG/ACT SOLN (FLUNISOLIDE) 2 spray/side once daily  #1 x 11   Entered and Authorized by:   Waymon Budge MD   Signed by:   Waymon Budge MD on 09/10/2010   Method used:   Print then Give to Patient   RxID:   [id number] AMOXICILLIN 500 MG CAPS (AMOXICILLIN) 1 three times a day for infection  #21 x 2   Entered and Authorized by:   Waymon Budge MD   Signed by:   Waymon Budge MD on 09/10/2010   Method used:   Print then Give to  Patient   RxID:   [id number] FLUNISOLIDE 29 MCG/ACT SOLN (FLUNISOLIDE) 2 spray/side once daily  #1 x 11   Entered and Authorized by:   Waymon Budge MD   Signed by:   Waymon Budge MD on 09/10/2010   Method used:   Electronically to        [facility]. [phone]* (retail)       [address]       [place], [place]  [address]       Ph: [phone]       Fax: [phone]   RxID:   [id number] EPIPEN 0.3 MG/0.3ML DEVI (EPINEPHRINE) For severe allergic reaction  #1 x prn   Entered and Authorized by:   Waymon Budge MD   Signed by:   Waymon Budge MD on 09/10/2010   Method used:   Electronically to        [facility]. [phone]* (retail)       [address]       [place], [place]  [address]       Ph: [phone]       Fax: [phone]   RxID:   [id number] PROVENTIL HFA 108 (90 BASE) MCG/ACT AERS (ALBUTEROL SULFATE) 2 puffs four times a day prn  #1 x prn   Entered and Authorized by:   Waymon Budge MD    Signed by:   Waymon Budge MD on 09/10/2010   Method used:   Electronically to        [facility]. [phone]* (retail)       [address]       [place], [place]  [address]       Ph: [phone]       Fax: [phone]   RxID:   [id number]

## 2010-10-04 ENCOUNTER — Ambulatory Visit (INDEPENDENT_AMBULATORY_CARE_PROVIDER_SITE_OTHER): Payer: BC Managed Care – PPO | Admitting: Internal Medicine

## 2010-10-04 ENCOUNTER — Encounter (INDEPENDENT_AMBULATORY_CARE_PROVIDER_SITE_OTHER): Payer: Self-pay | Admitting: *Deleted

## 2010-10-04 ENCOUNTER — Encounter: Payer: Self-pay | Admitting: Internal Medicine

## 2010-10-04 ENCOUNTER — Other Ambulatory Visit: Payer: Self-pay | Admitting: Internal Medicine

## 2010-10-04 ENCOUNTER — Other Ambulatory Visit: Payer: BC Managed Care – PPO

## 2010-10-04 DIAGNOSIS — Z0000 Encounter for general adult medical examination without abnormal findings: Secondary | ICD-10-CM

## 2010-10-04 DIAGNOSIS — E785 Hyperlipidemia, unspecified: Secondary | ICD-10-CM

## 2010-10-04 LAB — CBC WITH DIFFERENTIAL/PLATELET
Basophils Absolute: 0.1 10*3/uL (ref 0.0–0.1)
MCHC: 34.9 g/dL (ref 30.0–36.0)
Monocytes Absolute: 0.4 10*3/uL (ref 0.1–1.0)
Neutro Abs: 2.3 10*3/uL (ref 1.4–7.7)
RBC: 3.85 Mil/uL — ABNORMAL LOW (ref 3.87–5.11)

## 2010-10-04 LAB — BASIC METABOLIC PANEL
BUN: 13 mg/dL (ref 6–23)
CO2: 31 mEq/L (ref 19–32)
Calcium: 9.4 mg/dL (ref 8.4–10.5)
Chloride: 99 mEq/L (ref 96–112)
Creatinine, Ser: 0.6 mg/dL (ref 0.4–1.2)
GFR: 106.1 mL/min (ref >60.00)
Glucose, Bld: 92 mg/dL (ref 70–99)
Sodium: 140 mEq/L (ref 135–145)

## 2010-10-04 LAB — URINALYSIS, ROUTINE W REFLEX MICROSCOPIC
Bilirubin Urine: NEGATIVE
Ketones, ur: NEGATIVE
Leukocytes, UA: NEGATIVE
Nitrite: NEGATIVE
Specific Gravity, Urine: 1.015 (ref 1.000–1.030)
Total Protein, Urine: NEGATIVE
Urine Glucose: NEGATIVE
Urobilinogen, UA: 0.2 (ref 0.0–1.0)
pH: 6 (ref 5.0–8.0)

## 2010-10-04 LAB — LIPID PANEL
Total CHOL/HDL Ratio: 5
Triglycerides: 201 mg/dL — ABNORMAL HIGH (ref 0.0–149.0)

## 2010-10-04 LAB — TSH: TSH: 1.6 u[IU]/mL (ref 0.35–5.50)

## 2010-10-04 LAB — HEPATIC FUNCTION PANEL
Alkaline Phosphatase: 57 U/L (ref 39–117)
Bilirubin, Direct: 0.1 mg/dL (ref 0.0–0.3)

## 2010-10-08 ENCOUNTER — Other Ambulatory Visit: Payer: Self-pay | Admitting: Internal Medicine

## 2010-10-08 DIAGNOSIS — Z1231 Encounter for screening mammogram for malignant neoplasm of breast: Secondary | ICD-10-CM

## 2010-10-14 NOTE — Assessment & Plan Note (Signed)
Summary: YEARLY FU/ MEDCIARE /LABS AFTER/NWS #   Vital Signs:  Patient profile:   76 year old female Height:      61 inches Weight:      138 pounds BMI:     26.17 O2 Sat:      96 % on Room air Temp:     98.4 degrees F oral Pulse rate:   79 / minute BP sitting:   158 / 80  (left arm) Cuff size:   regular  Vitals Entered By: Margaret Pyle, CMA (October 04, 2010 8:46 AM)  O2 Flow:  Room air  Preventive Care Screening  Last Flu Shot:    Date:  06/29/2010    Results:  given   CC: Medicare yearly   Primary Care Provider:  Oliver Barre  CC:  Medicare yearly.  History of Present Illness: here for wellness and f/u ; has o0ngoing stress , works for [employer] and jsut now [employer] for the new [employer] system;  BP mult recently where she works has been 130/70's; Pt denies CP, worsening sob, doe, wheezing, orthopnea, pnd, worsening LE edema, palps, dizziness or syncope  Pt denies new neuro symptoms such as headache, facial or extremity weakness  Pt denies polydipsia, polyuria/   Overall good compliance with meds, trying to follow low chol  diet, wt stable, little excercise however  . Denies worsening depressive symptoms, suicidal ideation, or panic.   No fever, wt loss, night sweats, loss of appetite or other constitutional symptoms  Overall good compliance with meds, and good tolerability., infact take centrum once daily in addition to her usual meds. Pt states good ability with ADL's, low fall risk, home safety reviewed and adequate, no significant change in hearing or vision, trying to follow lower chol diet, and occasionally active only with regular excercise. has been doing well, in fact was not here for the whole last yr for acute illness, and did see Dr Maple Hudson last mo;  only used her antibx once last yr.    Preventive Screening-Counseling & Management      Drug Use:  no.    Problems Prior to Update: 1)  Unspecified Vitamin D Deficiency  (ICD-268.9) 2)  Preventive Health Care   (ICD-V70.0) 3)  Rash-nonvesicular  (ICD-782.1) 4)  Postherpetic Neuralgia  (ICD-053.19) 5)  Herpes Zoster  (ICD-053.9) 6)  Anemia, Macrocytic  (ICD-281.9) 7)  Preventive Health Care  (ICD-V70.0) 8)  Preventive Health Care  (ICD-V70.0) 9)  Hypertension  (ICD-401.9) 10)  Osteoporosis  (ICD-733.00) 11)  Hyperlipidemia  (ICD-272.4) 12)  Asthma  (ICD-493.90) 13)  Allergic Rhinitis  (ICD-477.9) 14)  Barretts Esophagus  (ICD-530.85) 15)  Gerd  (ICD-530.81)  Medications Prior to Update: 1)  Hydrochlorothiazide 25 Mg  Tabs (Hydrochlorothiazide) .... Take 1 Tab By Mouth Every Morning 2)  Ecotrin Low Strength 81 Mg  Tbec (Aspirin) .Marland Kitchen.. 1 By Mouth Qd 3)  Proventil Hfa 108 (90 Base) Mcg/act Aers (Albuterol Sulfate) .... 2 Puffs Four Times A Day Prn 4)  Allergy Vaccine Vaccine   1:10   Go 0.35  (W-E) 5)  Epipen 0.3 Mg/0.86ml Devi (Epinephrine) .... For Severe Allergic Reaction 6)  Flunisolide 29 Mcg/act Soln (Flunisolide) .... 2 Spray/side Once Daily 7)  Amitriptyline Hcl 25 Mg Tabs (Amitriptyline Hcl) .Marland Kitchen.. 1 By Mouth At Bedtime As Needed 8)  Fish Oil Maximum Strength 1200 Mg Caps (Omega-3 Fatty Acids) .... Once Daily 9)  Amoxicillin 500 Mg Caps (Amoxicillin) .Marland Kitchen.. 1 Three Times A Day For Infection  Current Medications (  verified): 1)  Hydrochlorothiazide 25 Mg  Tabs (Hydrochlorothiazide) .... Take 1 Tab By Mouth Every Morning 2)  Ecotrin Low Strength 81 Mg  Tbec (Aspirin) .Marland Kitchen.. 1 By Mouth Qd 3)  Proventil Hfa 108 (90 Base) Mcg/act Aers (Albuterol Sulfate) .... 2 Puffs Four Times A Day Prn 4)  Allergy Vaccine Vaccine   1:10   Go 0.35  (W-E) 5)  Epipen 0.3 Mg/0.70ml Devi (Epinephrine) .... For Severe Allergic Reaction 6)  Flunisolide 29 Mcg/act Soln (Flunisolide) .... 2 Spray/side Once Daily 7)  Amitriptyline Hcl 25 Mg Tabs (Amitriptyline Hcl) .Marland Kitchen.. 1 By Mouth At Bedtime As Needed 8)  Fish Oil Maximum Strength 1200 Mg Caps (Omega-3 Fatty Acids) .... Once Daily 9)  Omeprazole 20 Mg Cpdr (Omeprazole)  .Marland Kitchen.. 1po Once Daily  Allergies: 1)  ! * Altace 2)  ! Prednisone 3)  ! Lipitor 4)  ! 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 [employer] in [place] Never Smoked Alcohol use-yes Drug use-no  Risk Factors: Smoking Status: never (09/10/2010)  Past Medical History: GERD Barrett's esophagus Allergic rhinitis Asthma Hyperlipidemia Osteoporosis hand djd Hypertension COPD Hx shingles left upper arm/ axilla vit d deficiency  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         all otherwise negative per pt -  except stil needs the elevil at night to help the PHN to the post left upper arm at night, as well as minor mid lower sacral pain helped with heat, and as needed reflux, mild  Physical Exam  General:  alert and overweight-appearing.   Head:  normocephalic and atraumatic.   Eyes:  vision grossly intact, pupils equal, and pupils  round.   Ears:  R ear normal and L ear normal.   Nose:  no external deformity and no nasal discharge.   Mouth:  no gingival abnormalities and pharynx pink and moist.   Neck:  supple and no masses.   Lungs:  normal respiratory effort and normal breath sounds.   Heart:  normal rate and regular rhythm.   Abdomen:  soft, non-tender, and normal bowel sounds.   Msk:  no joint tenderness and no joint swelling.   Extremities:  no edema, no erythema  Neurologic:  strength normal in all extremities, sensation intact to light touch, and gait normal.   Skin:  color normal and no rashes.  except for a "rough patch" chronic to left mid medial arm,  mild red but nontender Psych:  not anxious appearing and not depressed appearing.     Impression & Recommendations:  Problem # 1:  Preventive Health Care (ICD-V70.0) Overall doing well, age  appropriate education and counseling updated, referral for preventive services and immunizations addressed, dietary counseling and smoking status adressed , most recent labs reviewed, ecg reviewed I have personally reviewed and have noted 1.The patient's medical and social history 2.Their use of alcohol, tobacco or illicit drugs 3.Their current medications and supplements 4. Functional ability including ADL's, fall risk, home safety risk, hearing & visual impairment 5.Diet and physical activities 6.Evidence for depression or mood disorders The patients weight, height, BMI  have been recorded in the chart I have made referrals, counseling and provided education to the patient based review of the above  Orders: EKG w/ Interpretation (93000) TLB-BMP (Basic Metabolic Panel-BMET) (80048-METABOL) TLB-CBC Platelet - w/Differential (85025-CBCD) TLB-Hepatic/Liver Function Pnl (80076-HEPATIC) TLB-Lipid Panel (80061-LIPID) TLB-TSH (Thyroid Stimulating Hormone) (84443-TSH) TLB-Udip ONLY (81003-UDIP)  Problem # 2:  UNSPECIFIED VITAMIN D DEFICIENCY (ICD-268.9)  after reading  on the internet, she is wary of taking  vit d high dose or even low dose;  encouraged to take at least balanced diet and MVI - 1 per day  Orders: T-Vitamin D (25-Hydroxy) (11914-78295)  Problem # 3:  HYPERLIPIDEMIA (ICD-272.4)  dw pt - has  been intolerant to lipitor in the past; to check lipids, but has been statin intolerant in the pastl, cont diet for now  Labs Reviewed: SGOT: 22 (09/21/2009)   SGPT: 19 (09/21/2009)   HDL:53.70 (09/21/2009), 47.1 (09/16/2008)  LDL:DEL (09/16/2008), DEL (09/03/2007)  Chol:245 (09/21/2009), 226 (09/16/2008)  Trig:205.0 (09/21/2009), 221 (09/16/2008)  Problem # 4:  OSTEOPOROSIS (ICD-733.00)  due for f/u dxa - consider prolia if qualifies  Orders: T-Bone Densitometry (62130)  Complete Medication List: 1)  Hydrochlorothiazide 25 Mg Tabs (Hydrochlorothiazide) .... Take 1 tab by mouth every morning 2)  Ecotrin Low Strength 81 Mg Tbec (Aspirin) .Marland Kitchen.. 1 by mouth qd 3)  Proventil Hfa 108 (90 Base) Mcg/act Aers (Albuterol sulfate) .... 2 puffs four times a day prn 4)  Allergy Vaccine Vaccine 1:10 Go 0.35 (w-e)  5)  Epipen 0.3 Mg/0.62ml Devi (Epinephrine) .... For severe allergic reaction 6)  Flunisolide 29 Mcg/act Soln (Flunisolide) .... 2 spray/side once daily 7)  Amitriptyline Hcl 25 Mg Tabs (Amitriptyline hcl) .Marland Kitchen.. 1 by mouth at bedtime as needed 8)  Fish Oil Maximum Strength 1200 Mg Caps (Omega-3 fatty acids) .... Once daily 9)  Omeprazole 20 Mg Cpdr (Omeprazole) .Marland Kitchen.. 1po once daily  Patient Instructions: 1)  Please call for your yearlymammogram 2)  Please schedule the bone density before leaving today 3)  Please consider the Prolia for bone less (but this will depend on the bone density result) 4)  Continue all previous medications as before this visit  5)  Please schedule a follow-up appointment in 1 year, or sooner if needed 6)  Please keep your appts with Dr Maple Hudson as you do Prescriptions: OMEPRAZOLE 20 MG CPDR (OMEPRAZOLE) 1po once daily  #90 x  3   Entered and Authorized by:   Corwin Levins MD   Signed by:   Corwin Levins MD on 10/04/2010   Method used:   Electronically to        [facility]. [phone]* (retail)       [address]       [place], [place]  [address]       Ph: [phone]       Fax: [phone]   RxID:   [id number] AMITRIPTYLINE HCL 25 MG TABS (AMITRIPTYLINE HCL) 1 by mouth at bedtime as  needed  #90 x 1   Entered and Authorized by:   Corwin Levins MD   Signed by:   Corwin Levins MD on 10/04/2010   Method used:   Print then Give to Patient   RxID:   [id number] PROVENTIL HFA 108 (90 BASE) MCG/ACT AERS (ALBUTEROL SULFATE) 2 puffs four times a day prn  #3 x 3   Entered and Authorized by:   Corwin Levins MD   Signed by:   Corwin Levins MD on 10/04/2010   Method used:   Electronically to        [facility]. [phone]* (retail)       [address]       [place], [place]  [address]       Ph: [phone]       Fax: [phone]   RxID:   [id number] HYDROCHLOROTHIAZIDE 25 MG  TABS (HYDROCHLOROTHIAZIDE) Take 1 tab by mouth every morning  #90 x 3   Entered and Authorized by:   Corwin Levins MD   Signed by:   Corwin Levins MD on 10/04/2010   Method used:   Electronically to        [facility]. [phone]* (retail)       [address]       [place], [place]  [address]       Ph: [phone]       Fax: [phone]   RxID:   [id number]    Orders Added: 1)  EKG w/ Interpretation [93000] 2)  T-Bone Densitometry [77080] 3)  TLB-BMP (Basic Metabolic Panel-BMET) [80048-METABOL] 4)  TLB-CBC Platelet - w/Differential [85025-CBCD] 5)  TLB-Hepatic/Liver Function Pnl [80076-HEPATIC] 6)  TLB-Lipid Panel [80061-LIPID] 7)  TLB-TSH (Thyroid Stimulating Hormone) [84443-TSH] 8)  TLB-Udip ONLY [81003-UDIP] 9)  T-Vitamin D (25-Hydroxy) [99371-69678] 10)  Est. Patient 65& > [93810]

## 2010-10-18 ENCOUNTER — Other Ambulatory Visit: Payer: Self-pay | Admitting: Internal Medicine

## 2010-10-18 ENCOUNTER — Ambulatory Visit (INDEPENDENT_AMBULATORY_CARE_PROVIDER_SITE_OTHER)
Admission: RE | Admit: 2010-10-18 | Discharge: 2010-10-18 | Disposition: A | Discharge status: Home / Self Care | Payer: BC Managed Care – PPO | Source: Ambulatory Visit | Attending: Internal Medicine | Admitting: Internal Medicine

## 2010-10-18 ENCOUNTER — Ambulatory Visit (HOSPITAL_COMMUNITY)
Admission: RE | Admit: 2010-10-18 | Discharge: 2010-10-18 | Disposition: A | Discharge status: Home / Self Care | Payer: MEDICARE | Source: Ambulatory Visit | Attending: Internal Medicine | Admitting: Internal Medicine

## 2010-10-18 ENCOUNTER — Encounter (HOSPITAL_COMMUNITY): Payer: Self-pay

## 2010-10-18 ENCOUNTER — Encounter: Payer: Self-pay | Admitting: Internal Medicine

## 2010-10-18 ENCOUNTER — Other Ambulatory Visit: Payer: BC Managed Care – PPO

## 2010-10-18 DIAGNOSIS — Z1231 Encounter for screening mammogram for malignant neoplasm of breast: Secondary | ICD-10-CM | POA: Insufficient documentation

## 2010-10-18 DIAGNOSIS — M810 Age-related osteoporosis without current pathological fracture: Secondary | ICD-10-CM

## 2010-10-26 LAB — HM MAMMOGRAPHY

## 2011-01-11 NOTE — Assessment & Plan Note (Signed)
[facility]                             PULMONARY OFFICE NOTE   DESSIE, TATEM                       MRN:          [id number]  DATE:04/12/2007                            DOB:          1934/11/17    PROBLEM LIST:  1. Allergic asthma with bronchitis.  2. Allergic rhinitis.   PRIMARY CARE PHYSICIAN:  Oliver Barre, M.D.   HISTORY OF PRESENT ILLNESS:  Complaint of periorbital rash for the past  3 weeks. She has been outdoors to mow grass, wearing a sun visor. She  uses hypoallergenic makeup in limited amounts. This began before a  recent antibiotic, which she took to clear bronchitis. She has a history  of photo sensitivity reaction to Altace but is no longer on that  medication. She has been using a GenTeal eye lubricant for about 3  weeks. She is not doing anything different with her hair.   MEDICATIONS:  Allergy vaccine at 1 to 10, HCTZ 25 mg X1/2, baby aspirin,  Boniva 150 mg per month, albuterol inhaler. She had a prescription for  Amoxicillin. Epi-Pen available.   ALLERGIES:  ALTACE, PREDNISONE, LIPITOR.   PHYSICAL EXAMINATION:  VITAL SIGNS:  Weight 134 pounds. Blood pressure  150/80, pulse 91, room air saturation 93%.  HEENT:  Orbital eczema. Globes are clear. Nasal airway seems clear. No  adenopathy.  CHEST:  Clear.  HEART:  Sounds normal.   IMMUNIZATIONS:  Periorbital eczema. This might be photosensitization  from HCTZ or something that she has touched and transferred to her face.  We discussed options.   PLAN:  Depo-Medrol 80 mg IM, Claritin. Keep scheduled appointment,  earlier p.r.n.     Clinton D. Maple Hudson, MD, Tonny Bollman, FACP  Electronically Signed    CDY/MedQ  DD: 04/14/2007  DT: 04/15/2007  Job #: [id number]

## 2011-01-14 NOTE — Op Note (Signed)
   NAME:  Sierra Robbins, Sierra Robbins NO.:  [id number]   MEDICAL RECORD NO.:  [id number]                   PATIENT TYPE:  AMB   LOCATION:  ENDO                                 FACILITY:  [facility]   PHYSICIAN:  Charolett Bumpers, M.D.             DATE OF BIRTH:  06-Apr-1935   DATE OF PROCEDURE:  05/07/2002  DATE OF DISCHARGE:                                 OPERATIVE REPORT   PREOPERATIVE DIAGNOSIS:  Esophagogastroduodenoscopy with Barrett's esophagus  biopsy.   INDICATIONS:  The patient is a 76 year old female born 2034/10/17.  On 11/07/01  the patient underwent an esophagogastroduodenoscopy, which revealed  Barrett's esophagus with low-grade glandular dysplasia.  For the past six  months the patient has been on Protonix.  Repeat esophagogastroduodenoscopy  with Barrett's esophagus biopsy is scheduled to be performed today.   On 11/07/01, proctocolonoscopy to the cecum was also performed, which  revealed no endoscopic evidence for the presence of colorectal neoplasia.   ENDOSCOPIST:  Charolett Bumpers, M.D.   PREMEDICATION:  Versed 5 mg, Demerol 50 mg.   ENDOSCOPE:  Olympus gastroscope.   DESCRIPTION OF PROCEDURE:  After obtaining informed consent, the patient was  placed in the left lateral decubitus position.  I administered intravenous  Demerol and intravenous Versed to achieve conscious sedation for the  procedure.  The patient's blood pressure, oxygen saturation, and cardiac  rhythm were monitored throughout the procedure and documented in the medical  record.   The Olympus gastroscope was passed through the posterior hypopharynx into  the proximal esophagus without difficulty.  The hypopharynx, larynx, and  vocal cords appeared normal.   Esophagoscopy:  The proximal, mid-, and lower segments of the esophageal  mucosa appeared completely normal.  The squamocolumnar junction and  esophagogastric junction are noted at 37 cm from the incisor teeth.  Endoscopically there is no evidence for the presence of Barrett's esophagus,  esophageal mucosal scarring, esophageal ulceration, or esophageal tumors.  Five biopsies were taken at the esophagogastric junction.   Gastroscopy:  Retroflexed view of the gastric cardia and fundus was normal.  The diaphragmatic hiatus was slightly patulous.  The gastric body, antrum,  and pylorus appeared normal.   Duodenoscopy:  The duodenal bulb and descending duodenum appeared normal.    ASSESSMENT:  Barrett's esophagus associated with low-grade glandular  dysplasia diagnosed by 11/07/01 esophagogastroduodenoscopy.  Repeat  esophagogastroduodenoscopy today is completely normal.  Biopsies were again  taken at the esophagogastric junction.                                                 Charolett Bumpers, M.D.    MKJ/MEDQ  D:  05/07/2002  T:  05/07/2002  Job:  [id number]   cc:   Desma Maxim, M.D.

## 2011-01-14 NOTE — Assessment & Plan Note (Signed)
[facility]                             PULMONARY OFFICE NOTE   NAME:JOHNSONEndiya, Klahr                       MRN:          [id number]  DATE:09/04/2006                            DOB:          Oct 27, 1934    PULMONARY/ALLERGY FOLLOWUP   PROBLEMS:  1. Allergic asthma with bronchitis.  2. Allergic rhinitis.   HISTORY:  One-year followup.  She continues allergy vaccine without  problems.  We have reviewed risk, safety issues, and options,  specifically including concerns of administration outside of a medical  office, anaphylaxis, and epinephrine.  Her epinephrine injector is  updated.  She developed head congestion with a viral syndrome around  Christmas, but that has resolved.  She mentions minor vertex headache,  but no retro-orbital discomfort, problems with the ears, nasal  discharge, cough, or wheeze.  She is running a vaporizer in her home and  feels this helps.   MEDICATIONS:  1. Allergy vaccine at 1:10.  2. Hydrochlorothiazide 1/2 times 25 mg.  3. Multivitamins.  4. Aspirin 81 mg.  5. Albuterol inhaler.  6. EpiPen.   DRUG INTOLERANCES:  PREDNISONE.  LIPITOR.   OBJECTIVE:  Weight 138 pounds, BP 138/82, pulse 75, room air saturation  96%.  EAR, NOSE, AND THROAT:  Clear.  Voice quality is normal with no stridor.  There is no neck vein distension.  CHEST:  Quiet and clear with no cough or wheeze.  HEART:  Sounds are regular without murmur.  There is no edema.   IMPRESSION:  Asthma and allergic rhinitis, stable.   PLAN:  1. She asks for an amoxicillin prescription 500 mg t.i.d. for 7 days,      to hold this winter, and after discussion, I agreed.  2. EpiPen is refilled.  3. Schedule return in 1 year, earlier p.r.n.     Clinton D. Maple Hudson, MD, Tonny Bollman, FACP  Electronically Signed    CDY/MedQ  DD: 09/10/2006  DT: 09/10/2006  Job #: [id number]   cc:   Donia Guiles, M.D.

## 2011-01-14 NOTE — Procedures (Signed)
[facility]  Patient:    Sierra Robbins, Sierra Robbins Visit Number: [id number] MRN: [id number]          Service Type: Attending:  Verlin Grills, M.D. Dictated by:   Verlin Grills, M.D. Proc. Date: 11/07/01   CC:         Desma Maxim, M.D.   Procedure Report  PROCEDURE: 1. Esophagogastroduodenoscopy. 2. Esophagogastric biopsy. 3. Screening colonoscopy.  REFERRING PHYSICIAN:  Desma Maxim, M.D.  PROCEDURE INDICATION:  Sierra Robbins is a 76 year old female born [birth year]. Sierra Robbins has heartburn and intermittent dysphagia. She is also due for her first screening colonoscopy with polypectomy to prevent colon cancer.  I discussed with Sierra Robbins the complications associated with Esophagogastroduodenoscopy, Savary esophageal dilation, colonoscopy and polypectomy, including a 15 per 1000 risk of bleeding and 4 per 1000 risk of colon perforation requiring surgical repair. Sierra Robbins has signed the operative permit.  ENDOSCOPIST:  Verlin Grills, M.D.  PREMEDICATION:  Demerol 50 mg, Versed 9 mg.  ENDOSCOPE:  Olympus gastroscope and Pediatric Colonoscope.  PROCEDURE:  Esophagogastroduodenoscopy with esophagogastric junction biosies.  DESCRIPTION OF PROCEDURE:  After obtaining informed consent, Sierra Robbins was placed in the left lateral decubitus position. I administered intravenous Demerol and intravenous Versed to achieve conscious sedation for the procedure. The patients blood pressure, oxygen saturation, and cardiac rhythm were monitored throughout the procedure and documented in the medical record.  The Olympus gastroscope was passed through the posterior hypopharynx into the proximal esophagus without difficulty. The hypopharynx, larynx, and vocal cords appeared normal.  Esophagoscopy:  The proximal and mid segments of the esophagus appeared completely normal. Close examination of the distal esophagus is  unremarkable except for what appears to be mild inflammation at the esophagogastric junction manifested by a redness. There was certainly no esophageal mucosal scarring, esophageal stricture formation, esophageal obstruction. Biopsies were taken at the esophagogastric junction (squamocolumnar junction) and sent for pathologic evaluation to rule out Barretts esophagus.  Gastroscopy:  Retroflex view of the gastric cardia and fundus was normal. The gastric body, antrum, and pylorus appeared normal.  Duodenoscopy:  The duodenal bulb and descending duodenum appeared normal.  ASSESSMENT:  Normal esophagogastroduodenoscopy except for erythema at the esophagogastric junction which was biopsied to rule out Barretts esophagus.  PROCEDURE:  Screening proctocolonoscopy to the cecum.  DESCRIPTION OF PROCEDURE:   Anal inspection was normal. Digital rectal exam was normal. The Olympus Pediatric video colonoscope was introduced into the rectum and advanced to the cecum. Colonic preparation for the exam today was excellent.  Rectum:  Normal.  Sigmoid colon and descending colon:  Normal.  Splenic flexure:  Normal.  Transverse colon:  Normal.  Hepatic flexure:  Normal.  Ascending colon:  Normal.  Cecum and ileocecal valve:  Normal.  ASSESSMENT:  Normal screening proctocolonoscopy to the cecum. No endoscopic evidence for the presence of colorectal neoplasia.  RECOMMENDATIONS:  Repeat colonoscopy in approximately 10 years. Dictated by: Jaynee Eagles, M.D. Attending:  Verlin Grills, M.D. DD:  11/07/01 TD:  11/07/01 Job: [id number]

## 2011-01-21 ENCOUNTER — Ambulatory Visit (INDEPENDENT_AMBULATORY_CARE_PROVIDER_SITE_OTHER): Payer: BC Managed Care – PPO

## 2011-01-21 DIAGNOSIS — J309 Allergic rhinitis, unspecified: Secondary | ICD-10-CM

## 2011-06-22 ENCOUNTER — Ambulatory Visit (INDEPENDENT_AMBULATORY_CARE_PROVIDER_SITE_OTHER): Payer: BC Managed Care – PPO

## 2011-06-22 DIAGNOSIS — J309 Allergic rhinitis, unspecified: Secondary | ICD-10-CM

## 2011-09-08 ENCOUNTER — Encounter: Payer: Self-pay | Admitting: Internal Medicine

## 2011-09-09 ENCOUNTER — Ambulatory Visit (INDEPENDENT_AMBULATORY_CARE_PROVIDER_SITE_OTHER): Payer: BC Managed Care – PPO | Admitting: Internal Medicine

## 2011-09-09 ENCOUNTER — Encounter: Payer: Self-pay | Admitting: Internal Medicine

## 2011-09-09 VITALS — BP 140/80 | HR 92 | Ht 61.0 in | Wt 140.6 lb

## 2011-09-09 DIAGNOSIS — J309 Allergic rhinitis, unspecified: Secondary | ICD-10-CM

## 2011-09-09 DIAGNOSIS — J45909 Unspecified asthma, uncomplicated: Secondary | ICD-10-CM

## 2011-09-09 MED ORDER — EPINEPHRINE 0.3 MG/0.3ML IJ DEVI: 0.3000 mg | Freq: Once | INTRAMUSCULAR | Status: DC

## 2011-09-09 MED ORDER — ALBUTEROL SULFATE HFA 108 (90 BASE) MCG/ACT IN AERS: 2.0000 | INHALATION_SPRAY | RESPIRATORY_TRACT | Status: DC | PRN

## 2011-09-09 MED ORDER — FLUNISOLIDE 29 MCG/ACT NA SOLN: 2.0000 | Freq: Two times a day (BID) | NASAL | Status: DC

## 2011-09-09 NOTE — Progress Notes (Signed)
09/09/11- 76 yoF never smoker followed for allergic rhinitis, asthma, complicated by GERD/ Barretts, HBP LOV-September 10, 2010 She feels allergy vaccine is doing well. Has had no bad colds. Did get flu shot. We discussed probability of an early pollen season this year and will refill her medications. She has had no asthma/wheezing in a year. GERD symptoms have been well controlled using Gaviscon when needed.  ROS-see HPI Constitutional:   No-   weight loss, night sweats, fevers, chills, fatigue, lassitude. HEENT:   No-  headaches, difficulty swallowing, tooth/dental problems, sore throat,       No-  sneezing, itching, ear ache, nasal congestion, post nasal drip,  CV:  No-   chest pain, orthopnea, PND, swelling in lower extremities, anasarca, dizziness, palpitations Resp: No-   shortness of breath with exertion or at rest.              No-   productive cough,  No non-productive cough,  No- coughing up of blood.              No-   change in color of mucus.  No- wheezing.   Skin: No-   rash or lesions. GI:  No-   heartburn, indigestion, abdominal pain, nausea, vomiting, diarrhea,                 change in bowel habits, loss of appetite GU: MS:  No-   joint pain or swelling.  No- decreased range of motion.  No- back pain. Neuro-     nothing unusual Psych:  No- change in mood or affect. No depression or anxiety.  No memory loss.  OBJ General- Alert, Oriented, Affect-appropriate, Distress- none acute Skin- rash-none, lesions- none, excoriation- none Lymphadenopathy- none Head- atraumatic            Eyes- Gross vision intact, PERRLA, conjunctivae clear secretions            Ears- Hearing, canals-normal            Nose- Clear, no-Septal dev, mucus, polyps, erosion, perforation             Throat- Mallampati II , mucosa clear , drainage- none, tonsils- atrophic Neck- flexible , old tracheostomy scar ( surgical complication of ?thyroid surgery?) , no stridor , thyroid nl, carotid no bruit Chest -  symmetrical excursion , unlabored           Heart/CV- RRR , no murmur , no gallop  , no rub, nl s1 s2                           - JVD- none , edema- none, stasis changes- none, varices- none           Lung- clear to P&A, wheeze- none, cough- none , dullness-none, rub- none           Chest wall-  Abd- Br/ Gen/ Rectal- Not done, not indicated Extrem- cyanosis- none, clubbing, none, atrophy- none, strength- nl Neuro- grossly intact to observation

## 2011-09-09 NOTE — Patient Instructions (Signed)
Meds refiled  We are continuing allergy vaccine  Please call as needed

## 2011-09-11 NOTE — Assessment & Plan Note (Signed)
Well controlled, mild intermittent

## 2011-09-11 NOTE — Assessment & Plan Note (Signed)
Doing very well. She does not want to stop. We reviewed safety and realistic expectations for allergy vaccine again and renewed EpiPen.

## 2011-09-26 ENCOUNTER — Other Ambulatory Visit: Payer: Self-pay | Admitting: Internal Medicine

## 2011-09-28 NOTE — Telephone Encounter (Signed)
Please advise if okay to refill; Pt last seen 09-09-2011. Thanks.

## 2011-09-28 NOTE — Telephone Encounter (Signed)
OK to refill

## 2011-09-30 ENCOUNTER — Telehealth: Payer: Self-pay | Admitting: Allergy

## 2011-09-30 MED ORDER — AMOXICILLIN 500 MG PO CAPS: 500.0000 mg | ORAL_CAPSULE | Freq: Three times a day (TID) | ORAL | Status: DC

## 2011-09-30 NOTE — Telephone Encounter (Signed)
[facility] requested rx  Amoxicillin 500 mg  #21 Take 1 capsule by mouth three times a day for 7 days  Called patient to find out if she requested this  And pt stated that she is having wheezing,sob,spitting up  phlem denies any fever.and that she forgot to get a rx for this  on her last visit.  Allergies  Allergen Reactions  . Alendronate Sodium     REACTION: leg cramps  . Altace     Throat, mouth, and lip swelling  . Atorvastatin   . Ibandronate Sodium     REACTION: gi upset, and sluggish  . Prednisone   . Ramipril   . Statins     REACTION: myalgias   Dr Maple Hudson is this ok to fill  Thank you

## 2011-09-30 NOTE — Telephone Encounter (Signed)
Per CY-okay to give Amoxicillin 500mg #21 take 1 po tid no refills.  

## 2011-09-30 NOTE — Telephone Encounter (Signed)
Rx has been sent  

## 2011-10-01 ENCOUNTER — Encounter: Payer: Self-pay | Admitting: Internal Medicine

## 2011-10-01 DIAGNOSIS — Z0000 Encounter for general adult medical examination without abnormal findings: Secondary | ICD-10-CM | POA: Insufficient documentation

## 2011-10-06 ENCOUNTER — Other Ambulatory Visit (INDEPENDENT_AMBULATORY_CARE_PROVIDER_SITE_OTHER): Payer: BC Managed Care – PPO

## 2011-10-06 ENCOUNTER — Ambulatory Visit (INDEPENDENT_AMBULATORY_CARE_PROVIDER_SITE_OTHER): Payer: BC Managed Care – PPO | Admitting: Internal Medicine

## 2011-10-06 ENCOUNTER — Encounter: Payer: Self-pay | Admitting: Internal Medicine

## 2011-10-06 VITALS — BP 150/80 | HR 77 | Temp 97.8°F | Ht 61.5 in | Wt 142.4 lb

## 2011-10-06 DIAGNOSIS — I10 Essential (primary) hypertension: Secondary | ICD-10-CM

## 2011-10-06 DIAGNOSIS — D539 Nutritional anemia, unspecified: Secondary | ICD-10-CM

## 2011-10-06 DIAGNOSIS — Z0000 Encounter for general adult medical examination without abnormal findings: Secondary | ICD-10-CM

## 2011-10-06 DIAGNOSIS — E785 Hyperlipidemia, unspecified: Secondary | ICD-10-CM

## 2011-10-06 LAB — URINALYSIS, ROUTINE W REFLEX MICROSCOPIC
Bilirubin Urine: NEGATIVE
Nitrite: NEGATIVE
Total Protein, Urine: NEGATIVE
Urine Glucose: NEGATIVE
Urobilinogen, UA: 0.2 (ref 0.0–1.0)

## 2011-10-06 LAB — HEPATIC FUNCTION PANEL
ALT: 18 U/L (ref 0–35)
AST: 22 U/L (ref 0–37)
Alkaline Phosphatase: 58 U/L (ref 39–117)
Bilirubin, Direct: 0.1 mg/dL (ref 0.0–0.3)
Total Bilirubin: 0.9 mg/dL (ref 0.3–1.2)
Total Protein: 7.4 g/dL (ref 6.0–8.3)

## 2011-10-06 LAB — CBC WITH DIFFERENTIAL/PLATELET
Basophils Absolute: 0.1 10*3/uL (ref 0.0–0.1)
Eosinophils Absolute: 0.4 10*3/uL (ref 0.0–0.7)
Lymphocytes Relative: 43.1 % (ref 12.0–46.0)
MCHC: 34.4 g/dL (ref 30.0–36.0)
MCV: 102.7 fl — ABNORMAL HIGH (ref 78.0–100.0)
Monocytes Absolute: 0.4 10*3/uL (ref 0.1–1.0)
Neutrophils Relative %: 39.1 % — ABNORMAL LOW (ref 43.0–77.0)
RDW: 12.6 % (ref 11.5–14.6)

## 2011-10-06 LAB — LIPID PANEL
Cholesterol: 212 mg/dL — ABNORMAL HIGH (ref 0–200)
Total CHOL/HDL Ratio: 4
Triglycerides: 191 mg/dL — ABNORMAL HIGH (ref 0.0–149.0)
VLDL: 38.2 mg/dL (ref 0.0–40.0)

## 2011-10-06 LAB — BASIC METABOLIC PANEL
Calcium: 9.3 mg/dL (ref 8.4–10.5)
Creatinine, Ser: 0.7 mg/dL (ref 0.4–1.2)

## 2011-10-06 LAB — LDL CHOLESTEROL, DIRECT: Direct LDL: 127 mg/dL

## 2011-10-06 MED ORDER — AMITRIPTYLINE HCL 25 MG PO TABS: 25.0000 mg | ORAL_TABLET | Freq: Every evening | ORAL | Status: DC | PRN

## 2011-10-06 MED ORDER — NIACIN ER 750 MG PO TBCR: 1.0000 | EXTENDED_RELEASE_TABLET | Freq: Every day | ORAL | Status: DC

## 2011-10-06 MED ORDER — HYDROCHLOROTHIAZIDE 25 MG PO TABS: 12.5000 mg | ORAL_TABLET | Freq: Every day | ORAL | Status: DC

## 2011-10-06 MED ORDER — OMEPRAZOLE 20 MG PO CPDR: 20.0000 mg | DELAYED_RELEASE_CAPSULE | Freq: Every day | ORAL | Status: DC

## 2011-10-06 MED ORDER — AMLODIPINE BESYLATE 5 MG PO TABS: 5.0000 mg | ORAL_TABLET | Freq: Every day | ORAL | Status: DC

## 2011-10-06 NOTE — Assessment & Plan Note (Signed)

## 2011-10-06 NOTE — Patient Instructions (Addendum)
You will be contacted regarding the referral for: colonoscopy (please call your insurance to ask about your copay cost for a screening colonoscopy at [facility]) Please also ask if the shingles shot is covered as well; and if so, make a Nurse Appt for the shot Please remember to followup with your GYN for the yearly mammogram Please go to LAB in the Basement for the blood and/or urine tests to be done today Please call the phone number [phone] (the PhoneTree System) for results of testing in 2-3 days;  When calling, simply dial the number, and when prompted enter the MRN number above (the Medical Record Number) and the # key, then the message should start. Please start the amlodipine 5 mg per day for blood pressure, and the niacin for cholesterol Continue all other medications as before, including the fluid pill Please return in 6 mo with Lab testing done 3-5 days before

## 2011-10-08 ENCOUNTER — Encounter: Payer: Self-pay | Admitting: Internal Medicine

## 2011-10-08 NOTE — Assessment & Plan Note (Signed)
stable overall by hx and exam, most recent data reviewed with pt, and pt to add amlod 4 qd,  to f/u any worsening symptoms or concerns  BP Readings from Last 3 Encounters:  10/06/11 150/80  09/09/11 140/80  10/04/10 158/80

## 2011-10-08 NOTE — Assessment & Plan Note (Signed)
To check b12 

## 2011-10-08 NOTE — Assessment & Plan Note (Signed)
Uncontroleld, statin intolerant,  For niacin asd,  to f/u any worsening symptoms or concerns

## 2011-10-08 NOTE — Progress Notes (Signed)
Subjective:    Patient ID: Sierra Robbins, female    DOB: 1935-06-08, 77 y.o.   MRN: [id number]  HPI  Here for wellness and f/u;  Overall doing ok;  Pt denies CP, worsening SOB, DOE, wheezing, orthopnea, PND, worsening LE edema, palpitations, dizziness or syncope.  Pt denies neurological change such as new Headache, facial or extremity weakness.  Pt denies polydipsia, polyuria, or low sugar symptoms. Pt states overall good compliance with treatment and medications, good tolerability, and trying to follow lower cholesterol diet.  Pt denies worsening depressive symptoms, suicidal ideation or panic. No fever, wt loss, night sweats, loss of appetite, or other constitutional symptoms.  Pt states good ability with ADL's, low fall risk, home safety reviewed and adequate, no significant changes in hearing or vision, and occasionally active with exercise. No acute complaints Past Medical History  Diagnosis Date  . GERD (gastroesophageal reflux disease)   . Barrett's esophagus   . Allergic rhinitis   . Asthma   . Hyperlipidemia   . Osteoporosis   . DJD (degenerative joint disease)     hand  . Hypertension   . COPD (chronic obstructive pulmonary disease)   . History of shingles     Left upper arm/axilla  . Vitamin d deficiency    Past Surgical History  Procedure Date  . Appendectomy   . Vesicovaginal fistula closure w/ tah   . Cholecystectomy   . Nasal septum surgery   . Tracheostomy     s/p temp; at 77 yo for croup    reports that she has never smoked. She does not have any smokeless tobacco history on file. She reports that she drinks alcohol. She reports that she does not use illicit drugs. family history includes Heart attack in her mother; Rheum arthritis in her sister; Stomach cancer in her maternal grandfather; and Stroke in her father. Allergies  Allergen Reactions  . Alendronate Sodium     REACTION: leg cramps  . Altace     Throat, mouth, and lip swelling  . Atorvastatin   .  Ibandronate Sodium     REACTION: gi upset, and sluggish  . Prednisone   . Ramipril   . Statins     REACTION: myalgias   Current Outpatient Prescriptions on File Prior to Visit  Medication Sig Dispense Refill  . albuterol (PROVENTIL HFA) 108 (90 BASE) MCG/ACT inhaler Inhale 2 puffs into the lungs every 4 (four) hours as needed for wheezing or shortness of breath.  1 Inhaler  prn  . amoxicillin (AMOXIL) 500 MG capsule Take 1 capsule (500 mg total) by mouth 3 (three) times daily.  21 capsule  0  . aspirin 81 MG tablet Take 81 mg by mouth daily.      Marland Kitchen EPINEPHrine (EPIPEN) 0.3 mg/0.3 mL DEVI Inject 0.3 mLs (0.3 mg total) into the muscle once. For severe allergic reaction  1 Device  prn  . flunisolide (NASAREL) 29 MCG/ACT (0.025%) nasal spray Place 2 sprays into the nose 2 (two) times daily. Dose is for each nostril.  25 mL  prn  . Omega-3 Fatty Acids (FISH OIL) 1200 MG CAPS Take 1 capsule by mouth daily.       Review of Systems Review of Systems  Constitutional: Negative for diaphoresis, activity change, appetite change and unexpected weight change.  HENT: Negative for hearing loss, ear pain, facial swelling, mouth sores and neck stiffness.   Eyes: Negative for pain, redness and visual disturbance.  Respiratory: Negative for shortness of breath  and wheezing.   Cardiovascular: Negative for chest pain and palpitations.  Gastrointestinal: Negative for diarrhea, blood in stool, abdominal distention and rectal pain.  Genitourinary: Negative for hematuria, flank pain and decreased urine volume.  Musculoskeletal: Negative for myalgias and joint swelling.  Skin: Negative for color change and wound.  Neurological: Negative for syncope and numbness.  Hematological: Negative for adenopathy.  Psychiatric/Behavioral: Negative for hallucinations, self-injury, decreased concentration and agitation.      Objective:   Physical Exam BP 150/80  Pulse 77  Temp(Src) 97.8 F (36.6 C) (Oral)  Ht 5' 1.5"  (1.562 m)  Wt 142 lb 6 oz (64.581 kg)  BMI 26.47 kg/m2  SpO2 94% Physical Exam  VS noted Constitutional: Pt is oriented to person, place, and time. Appears well-developed and well-nourished.  HENT:  Head: Normocephalic and atraumatic.  Right Ear: External ear normal.  Left Ear: External ear normal.  Nose: Nose normal.  Mouth/Throat: Oropharynx is clear and moist.  Eyes: Conjunctivae and EOM are normal. Pupils are equal, round, and reactive to light.  Neck: Normal range of motion. Neck supple. No JVD present. No tracheal deviation present.  Cardiovascular: Normal rate, regular rhythm, normal heart sounds and intact distal pulses.   Pulmonary/Chest: Effort normal and breath sounds normal.  Abdominal: Soft. Bowel sounds are normal. There is no tenderness.  Musculoskeletal: Normal range of motion. Exhibits no edema.  Lymphadenopathy:  Has no cervical adenopathy.  Neurological: Pt is alert and oriented to person, place, and time. Pt has normal reflexes. No cranial nerve deficit.  Skin: Skin is warm and dry. No rash noted.  Psychiatric:  Has  normal mood and affect. Behavior is normal.     Assessment & Plan:

## 2011-10-31 ENCOUNTER — Encounter: Payer: Self-pay | Admitting: Gastroenterology

## 2011-11-04 ENCOUNTER — Telehealth: Payer: Self-pay

## 2011-11-04 NOTE — Telephone Encounter (Signed)
Called left message to call back 

## 2011-11-04 NOTE — Telephone Encounter (Signed)
Patient called stated she has [facility] [employer] and was informed by them there was an at home colon test that could be done in place of a Colonoscopy. Please advise call back number is [phone]

## 2011-11-04 NOTE — Telephone Encounter (Signed)
I think this may be referring to Hemocult cards that are sometimes done even on a yearly basis;  But this would not take the place of a colonscopy, which would still be needed

## 2011-11-04 NOTE — Telephone Encounter (Signed)
Patient informed and will check with her insurance on what they will pay for a colonoscopy.

## 2011-12-21 ENCOUNTER — Ambulatory Visit (INDEPENDENT_AMBULATORY_CARE_PROVIDER_SITE_OTHER): Payer: BC Managed Care – PPO

## 2011-12-21 DIAGNOSIS — J309 Allergic rhinitis, unspecified: Secondary | ICD-10-CM

## 2012-04-04 ENCOUNTER — Ambulatory Visit: Payer: BC Managed Care – PPO | Admitting: Internal Medicine

## 2012-05-01 ENCOUNTER — Other Ambulatory Visit: Payer: Self-pay | Admitting: Internal Medicine

## 2012-05-01 NOTE — Telephone Encounter (Signed)
Please advise if okay to refill. Thanks.  

## 2012-05-02 NOTE — Telephone Encounter (Signed)
Ok refill amoxacillin

## 2012-05-31 ENCOUNTER — Ambulatory Visit (INDEPENDENT_AMBULATORY_CARE_PROVIDER_SITE_OTHER): Payer: BC Managed Care – PPO

## 2012-05-31 DIAGNOSIS — J309 Allergic rhinitis, unspecified: Secondary | ICD-10-CM

## 2012-09-10 ENCOUNTER — Encounter: Payer: Self-pay | Admitting: Internal Medicine

## 2012-09-10 ENCOUNTER — Ambulatory Visit (INDEPENDENT_AMBULATORY_CARE_PROVIDER_SITE_OTHER): Payer: BC Managed Care – PPO | Admitting: Internal Medicine

## 2012-09-10 VITALS — BP 134/76 | HR 80 | Ht 61.0 in | Wt 142.0 lb

## 2012-09-10 DIAGNOSIS — J45909 Unspecified asthma, uncomplicated: Secondary | ICD-10-CM

## 2012-09-10 DIAGNOSIS — J302 Other seasonal allergic rhinitis: Secondary | ICD-10-CM

## 2012-09-10 DIAGNOSIS — J0110 Acute frontal sinusitis, unspecified: Secondary | ICD-10-CM

## 2012-09-10 DIAGNOSIS — J309 Allergic rhinitis, unspecified: Secondary | ICD-10-CM

## 2012-09-10 MED ORDER — EPINEPHRINE 0.3 MG/0.3ML IJ DEVI: 0.3000 mg | Freq: Once | INTRAMUSCULAR | Status: AC

## 2012-09-10 MED ORDER — AMOXICILLIN 500 MG PO CAPS: 500.0000 mg | ORAL_CAPSULE | Freq: Three times a day (TID) | ORAL | Status: DC

## 2012-09-10 NOTE — Progress Notes (Signed)
09/09/11- 76 yoF never smoker followed for allergic rhinitis, asthma, complicated by GERD/ Barretts, HBP LOV-September 10, 2010 She feels allergy vaccine is doing well. Has had no bad colds. Did get flu shot. We discussed probability of an early pollen season this year and will refill her medications. She has had no asthma/wheezing in a year. GERD symptoms have been well controlled using Gaviscon when needed.  09/10/12-  76 yoF never smoker followed for allergic rhinitis, asthma, complicated by GERD/ Barretts, HBP FOLLOWS FOR: started sneezing and coughing yesterday; overall had a good year last year; still on vaccine weekly and no reactions/issues.  Allergy vaccine 1:10 GO Dull right frontal headache for the last one or 2 days with some nasal congestion and sneezing No cough or wheeze. Nothing purulent.   ROS-see HPI Constitutional:   No-   weight loss, night sweats, fevers, chills, fatigue, lassitude. HEENT:   + headaches, no- difficulty swallowing, tooth/dental problems, sore throat,       +sneezing, no-itching, ear ache, +nasal congestion, post nasal drip,  CV:  No-   chest pain, orthopnea, PND, swelling in lower extremities, anasarca, dizziness, palpitations Resp: No-   shortness of breath with exertion or at rest.              No-   productive cough,  No non-productive cough,  No- coughing up of blood.              No-   change in color of mucus.  No- wheezing.   Skin: No-   rash or lesions. GI:  No-   heartburn, indigestion, abdominal pain, nausea, vomiting,  GU: MS:  No-   joint pain or swelling.   Neuro-     nothing unusual Psych:  No- change in mood or affect. No depression or anxiety.  No memory loss.  OBJ General- Alert, Oriented, Affect-appropriate, Distress- none acute Skin- rash-none, lesions- none, excoriation- none Lymphadenopathy- none Head- atraumatic            Eyes- Gross vision intact, PERRLA, conjunctivae clear secretions            Ears- Hearing, canals-normal         Nose- Clear/ not stuffy, no-Septal dev, mucus, polyps, erosion, perforation             Throat- Mallampati II , mucosa clear , drainage- none, tonsils- atrophic.+ Dentures Neck- flexible , old tracheostomy scar ( surgical complication of ?thyroid surgery?) , no stridor , thyroid nl, carotid no bruit Chest - symmetrical excursion , unlabored           Heart/CV- RRR , no murmur , no gallop  , no rub, nl s1 s2                           - JVD- none , edema- none, stasis changes- none, varices- none           Lung- clear to P&A, wheeze- none, cough- none , dullness-none, rub- none           Chest wall- sternotomy scar Abd- Br/ Gen/ Rectal- Not done, not indicated Extrem- cyanosis- none, clubbing, none, atrophy- none, strength- nl Neuro- grossly intact to observation

## 2012-09-10 NOTE — Patient Instructions (Addendum)
Suggest a non-sedating antihistamine like Claritin if needed  Script for Epipen refill while you continue allergy shots  Script for amoxacillin to hold in case needed

## 2012-09-22 DIAGNOSIS — J0110 Acute frontal sinusitis, unspecified: Secondary | ICD-10-CM | POA: Insufficient documentation

## 2012-09-22 NOTE — Assessment & Plan Note (Signed)
Okay to continue allergy vaccine 1:10. Risk-benefit discussion. EpiPen refill.

## 2012-09-22 NOTE — Assessment & Plan Note (Signed)
She is not describing asthma symptoms now. Well controlled.

## 2012-09-22 NOTE — Assessment & Plan Note (Signed)
Plan-prescription for amoxicillin. Discussed saline rinse.

## 2012-10-07 ENCOUNTER — Other Ambulatory Visit: Payer: Self-pay | Admitting: Internal Medicine

## 2012-10-08 ENCOUNTER — Encounter: Payer: Self-pay | Admitting: Internal Medicine

## 2012-10-08 ENCOUNTER — Ambulatory Visit (INDEPENDENT_AMBULATORY_CARE_PROVIDER_SITE_OTHER): Payer: BC Managed Care – PPO | Admitting: Internal Medicine

## 2012-10-08 ENCOUNTER — Other Ambulatory Visit (INDEPENDENT_AMBULATORY_CARE_PROVIDER_SITE_OTHER): Payer: BC Managed Care – PPO

## 2012-10-08 VITALS — BP 162/90 | HR 72 | Temp 97.2°F | Ht 61.0 in | Wt 141.8 lb

## 2012-10-08 DIAGNOSIS — Z0000 Encounter for general adult medical examination without abnormal findings: Secondary | ICD-10-CM

## 2012-10-08 DIAGNOSIS — E559 Vitamin D deficiency, unspecified: Secondary | ICD-10-CM

## 2012-10-08 DIAGNOSIS — D539 Nutritional anemia, unspecified: Secondary | ICD-10-CM

## 2012-10-08 DIAGNOSIS — M810 Age-related osteoporosis without current pathological fracture: Secondary | ICD-10-CM

## 2012-10-08 DIAGNOSIS — I10 Essential (primary) hypertension: Secondary | ICD-10-CM

## 2012-10-08 LAB — BASIC METABOLIC PANEL
BUN: 13 mg/dL (ref 6–23)
Creatinine, Ser: 0.6 mg/dL (ref 0.4–1.2)
GFR: 99.65 mL/min (ref >60.00)
Glucose, Bld: 97 mg/dL (ref 70–99)
Potassium: 3.9 mEq/L (ref 3.5–5.1)

## 2012-10-08 LAB — CBC WITH DIFFERENTIAL/PLATELET
Basophils Relative: 1 % (ref 0.0–3.0)
Eosinophils Relative: 6.7 % — ABNORMAL HIGH (ref 0.0–5.0)
HCT: 39.4 % (ref 36.0–46.0)
Lymphs Abs: 2 10*3/uL (ref 0.7–4.0)
Monocytes Relative: 7.3 % (ref 3.0–12.0)
Neutrophils Relative %: 48.5 % (ref 43.0–77.0)
Platelets: 252 10*3/uL (ref 150.0–400.0)
RBC: 3.96 Mil/uL (ref 3.87–5.11)
WBC: 5.4 10*3/uL (ref 4.5–10.5)

## 2012-10-08 LAB — URINALYSIS, ROUTINE W REFLEX MICROSCOPIC
Bilirubin Urine: NEGATIVE
Total Protein, Urine: NEGATIVE
Urine Glucose: NEGATIVE

## 2012-10-08 LAB — LIPID PANEL
Cholesterol: 222 mg/dL — ABNORMAL HIGH (ref 0–200)
HDL: 46.8 mg/dL (ref >39.00)
Triglycerides: 272 mg/dL — ABNORMAL HIGH (ref 0.0–149.0)
VLDL: 54.4 mg/dL — ABNORMAL HIGH (ref 0.0–40.0)

## 2012-10-08 LAB — HEPATIC FUNCTION PANEL
AST: 26 U/L (ref 0–37)
Albumin: 4.4 g/dL (ref 3.5–5.2)

## 2012-10-08 LAB — LDL CHOLESTEROL, DIRECT: Direct LDL: 121.5 mg/dL

## 2012-10-08 NOTE — Assessment & Plan Note (Signed)
For f/u vit d level 

## 2012-10-08 NOTE — Assessment & Plan Note (Signed)
Last dxa feb 2012 with osteopenia, for f/u 1 more yr, cont ca/vit d

## 2012-10-08 NOTE — Progress Notes (Signed)
Here for wellness and f/u;  Overall doing ok;  Pt denies CP, worsening SOB, DOE, wheezing, orthopnea, PND, worsening LE edema, palpitations, dizziness or syncope.  Pt denies neurological change such as new headache, facial or extremity weakness.  Pt denies polydipsia, polyuria, or low sugar symptoms. Pt states overall good compliance with treatment and medications, good tolerability, and has been trying to follow lower cholesterol diet.  Pt denies worsening depressive symptoms, suicidal ideation or panic. No fever, night sweats, wt loss, loss of appetite, or other constitutional symptoms.  Pt states good ability with ADL's, has low fall risk, home safety reviewed and adequate, no other significant changes in hearing or vision, and only occasionally active with exercise.  BP at home < 140/90.  No acute complaints.  Ambulates without difficulty, no specific pain.   Past Medical History  Diagnosis Date  . GERD (gastroesophageal reflux disease)   . Barrett's esophagus   . Allergic rhinitis   . Asthma   . Hyperlipidemia   . Osteoporosis   . DJD (degenerative joint disease)     hand  . Hypertension   . COPD (chronic obstructive pulmonary disease)   . History of shingles     Left upper arm/axilla  . Vitamin D deficiency    Past Surgical History  Procedure Laterality Date  . Appendectomy    . Vesicovaginal fistula closure w/ tah    . Cholecystectomy    . Nasal septum surgery    . Tracheostomy      s/p temp; at 78 yo for croup    reports that she has never smoked. She does not have any smokeless tobacco history on file. She reports that  drinks alcohol. She reports that she does not use illicit drugs. family history includes Heart attack in her mother; Rheum arthritis in her sister; Stomach cancer in her maternal grandfather; and Stroke in her father. Allergies  Allergen Reactions  . Alendronate Sodium     REACTION: leg cramps  . Atorvastatin   . Ibandronate Sodium     REACTION: gi upset, and  sluggish  . Prednisone   . Ramipril   . Ramipril     Throat, mouth, and lip swelling  . Statins     REACTION: myalgias   Current Outpatient Prescriptions on File Prior to Visit  Medication Sig Dispense Refill  . albuterol (PROVENTIL HFA) 108 (90 BASE) MCG/ACT inhaler Inhale 2 puffs into the lungs every 4 (four) hours as needed for wheezing or shortness of breath.  1 Inhaler  prn  . amitriptyline (ELAVIL) 25 MG tablet Take 12.5 mg by mouth at bedtime as needed.      Marland Kitchen aspirin 81 MG tablet Take 81 mg by mouth daily.      Marland Kitchen EPINEPHrine (EPIPEN) 0.3 mg/0.3 mL DEVI Inject 0.3 mLs (0.3 mg total) into the muscle once. For severe allergic reaction  1 Device  prn  . flunisolide (NASAREL) 29 MCG/ACT (0.025%) nasal spray Place 2 sprays into the nose 2 (two) times daily. Dose is for each nostril.  25 mL  prn  . Niacin CR 750 MG TBCR Take 1 tablet (750 mg total) by mouth daily.  90 each  3  . Omega-3 Fatty Acids (FISH OIL) 1200 MG CAPS Take 1 capsule by mouth daily.      Marland Kitchen omeprazole (PRILOSEC) 20 MG capsule Take 20 mg by mouth daily as needed.      . vitamin E 400 UNIT capsule Take 400 Units by mouth daily.      Marland Kitchen  hydrochlorothiazide (HYDRODIURIL) 25 MG tablet take 1/2 tablet by mouth once daily  45 tablet  3   No current facility-administered medications on file prior to visit.   Constitutional: Negative for diaphoresis, activity change, appetite change or unexpected weight change.  HENT: Negative for hearing loss, ear pain, facial swelling, mouth sores and neck stiffness.   Eyes: Negative for pain, redness and visual disturbance.  Respiratory: Negative for shortness of breath and wheezing.   Cardiovascular: Negative for chest pain and palpitations.  Gastrointestinal: Negative for diarrhea, blood in stool, abdominal distention or other pain Genitourinary: Negative for hematuria, flank pain or change in urine volume.  Musculoskeletal: Negative for myalgias and joint swelling.  Skin: Negative for  color change and wound.  Neurological: Negative for syncope and numbness. other than noted Hematological: Negative for adenopathy.  Psychiatric/Behavioral: Negative for hallucinations, self-injury, decreased concentration and agitation.    BP 162/90  Pulse 72  Temp(Src) 97.2 F (36.2 C) (Oral)  Ht 5\' 1"  (1.549 m)  Wt 141 lb 12 oz (64.297 kg)  BMI 26.8 kg/m2  SpO2 95% VS noted,  Constitutional: Pt is oriented to person, place, and time. Appears well-developed and well-nourished.  Head: Normocephalic and atraumatic.  Right Ear: External ear normal.  Left Ear: External ear normal.  Nose: Nose normal.  Mouth/Throat: Oropharynx is clear and moist.  Eyes: Conjunctivae and EOM are normal. Pupils are equal, round, and reactive to light.  Neck: Normal range of motion. Neck supple. No JVD present. No tracheal deviation present.  Cardiovascular: Normal rate, regular rhythm, normal heart sounds and intact distal pulses.   Pulmonary/Chest: Effort normal and breath sounds normal.  Abdominal: Soft. Bowel sounds are normal. There is no tenderness. No HSM  Musculoskeletal: Normal range of motion. Exhibits no edema.  Lymphadenopathy:  Has no cervical adenopathy.  Neurological: Pt is alert and oriented to person, place, and time. Pt has normal reflexes. No cranial nerve deficit.  Skin: Skin is warm and dry. No rash noted.  Psychiatric:  Has  normal mood and affect. Behavior is normal.

## 2012-10-08 NOTE — Assessment & Plan Note (Signed)
Overall doing well, age appropriate education and counseling updated, referrals for preventative services and immunizations addressed, dietary and smoking counseling addressed, most recent labs reviewed.  I have personally reviewed and have noted: 1) the patient's medical and social history 2) The pt's use of alcohol, tobacco, and illicit drugs 3) The patient's current medications and supplements 4) Functional ability including ADL's, fall risk, home safety risk, hearing and visual impairment 5) Diet and physical activities 6) Evidence for depression or mood disorder 7) The patient's height, weight, and BMI have been recorded in the chart I have made referrals, and provided counseling and education based on review of the above OK for colonoscopy, for labs today as well, to check with insurance about shingle shot ECG reviewed as per emr

## 2012-10-08 NOTE — Patient Instructions (Addendum)
Your EKG was ok today Please continue all other medications as before, and refills have been done if requested. Please have the pharmacy call with any other refills you may need. Please continue your efforts at being more active, low cholesterol diet, and weight control. Please go to the LAB in the Basement (turn left off the elevator) for the tests to be done today You will be contacted by phone if any changes need to be made immediately.  Otherwise, you will receive a letter about your results with an explanation Please consider calling [employer] cust service to see if they cover the shingles shot;  If so, please make a Nurse Appt to have this done Thank you for enrolling in MyChart. Please follow the instructions below to securely access your online medical record. MyChart allows you to send messages to your doctor, view your test results, renew your prescriptions, schedule appointments, and more. You will be contacted regarding the referral for: colonoscopy Please return in 1 year for your yearly visit, or sooner if needed, with Lab testing done 3-5 days before

## 2012-10-08 NOTE — Assessment & Plan Note (Signed)
For rbc folate level, doubt liver dz, etoh, med, or low b12 or myelodysplasia

## 2012-10-11 LAB — VITAMIN D 1,25 DIHYDROXY
Vitamin D2 1, 25 (OH)2: <8 pg/mL
Vitamin D3 1, 25 (OH)2: 63 pg/mL

## 2012-10-16 ENCOUNTER — Encounter: Payer: Self-pay | Admitting: Gastroenterology

## 2012-10-25 ENCOUNTER — Telehealth: Payer: Self-pay | Admitting: *Deleted

## 2012-10-25 NOTE — Telephone Encounter (Signed)
PATIENT CALLED REQUESTING DR. Jonny Ruiz CHANGE HER ANNUAL PHYSICAL TO STATE SHE HAD A WELLNESS VISIT PHYSCAIL INSTEAD OF THE STANDARD PHYSICAL THAT WAS SENT TO HER INSURANCE [employer] MEDICARE. ASKING FOR DR. Jonny Ruiz TO RE FILE THE CLAIM  SO THEY WILL PAY FOR THE VISIT, LAB, EKG. OR SHE WILL BE CHARGED OVER $300.OO. CB# [phone]

## 2012-10-25 NOTE — Telephone Encounter (Signed)
OK 

## 2012-10-25 NOTE — Telephone Encounter (Signed)
I am not sure what to say or do about this.  I will forward to [place] who might be able to help?

## 2012-10-31 ENCOUNTER — Telehealth: Payer: Self-pay | Admitting: *Deleted

## 2012-11-01 NOTE — Telephone Encounter (Signed)
Call transferred to [place] Alcantar, scheduler, to help patient.

## 2012-11-15 NOTE — Telephone Encounter (Signed)
The DOS in question has been submitted to be refilled to insurance with corrected coding by our [facility] department.

## 2012-11-19 ENCOUNTER — Encounter: Payer: BC Managed Care – PPO | Admitting: Gastroenterology

## 2012-11-27 ENCOUNTER — Ambulatory Visit (INDEPENDENT_AMBULATORY_CARE_PROVIDER_SITE_OTHER): Payer: BC Managed Care – PPO

## 2012-11-27 DIAGNOSIS — J309 Allergic rhinitis, unspecified: Secondary | ICD-10-CM

## 2013-01-30 ENCOUNTER — Other Ambulatory Visit: Payer: Self-pay | Admitting: Internal Medicine

## 2013-01-30 MED ORDER — AMOXICILLIN 500 MG PO CAPS: 500.0000 mg | ORAL_CAPSULE | Freq: Three times a day (TID) | ORAL | Status: DC

## 2013-05-03 ENCOUNTER — Ambulatory Visit (INDEPENDENT_AMBULATORY_CARE_PROVIDER_SITE_OTHER): Payer: BC Managed Care – PPO

## 2013-05-03 DIAGNOSIS — J309 Allergic rhinitis, unspecified: Secondary | ICD-10-CM

## 2013-06-25 ENCOUNTER — Telehealth: Payer: Self-pay | Admitting: Internal Medicine

## 2013-06-25 NOTE — Telephone Encounter (Signed)
Received refill request for Amoxicillin.  LM with pt to call back. We need to know why she is needing a refill on this certain medication.

## 2013-06-26 MED ORDER — AMOXICILLIN 500 MG PO CAPS: 500.0000 mg | ORAL_CAPSULE | Freq: Three times a day (TID) | ORAL | Status: DC

## 2013-06-26 NOTE — Telephone Encounter (Signed)
Per CY-okay to give patient Amoxicillin 500 mg #21 take 1 po TID no refills.

## 2013-06-26 NOTE — Telephone Encounter (Signed)
LMOMTCB x1 for pt--rx has been sent

## 2013-06-26 NOTE — Telephone Encounter (Signed)
Last visit 09/10/12: Pt states she has been having some increased wheezing, dry cough, and phlegm in her throat. She states CY has let her have amoxicillin to have on hold for this. She is requesting a refill. Please advise. Carron Curie, CMA Allergies  Allergen Reactions  . Alendronate Sodium     REACTION: leg cramps  . Atorvastatin   . Ibandronate Sodium     REACTION: gi upset, and sluggish  . Prednisone   . Ramipril   . Ramipril     Throat, mouth, and lip swelling  . Statins     REACTION: myalgias

## 2013-06-26 NOTE — Telephone Encounter (Signed)
Pt aware. Sierra Robbins, CMA  

## 2013-09-11 ENCOUNTER — Ambulatory Visit (INDEPENDENT_AMBULATORY_CARE_PROVIDER_SITE_OTHER): Payer: BC Managed Care – PPO | Admitting: Internal Medicine

## 2013-09-11 ENCOUNTER — Encounter: Payer: Self-pay | Admitting: Internal Medicine

## 2013-09-11 VITALS — BP 146/72 | HR 84 | Ht 61.0 in | Wt 141.4 lb

## 2013-09-11 DIAGNOSIS — J45909 Unspecified asthma, uncomplicated: Secondary | ICD-10-CM

## 2013-09-11 DIAGNOSIS — J3089 Other allergic rhinitis: Principal | ICD-10-CM

## 2013-09-11 DIAGNOSIS — J302 Other seasonal allergic rhinitis: Secondary | ICD-10-CM

## 2013-09-11 DIAGNOSIS — J309 Allergic rhinitis, unspecified: Secondary | ICD-10-CM

## 2013-09-11 MED ORDER — ALBUTEROL SULFATE HFA 108 (90 BASE) MCG/ACT IN AERS: 2.0000 | INHALATION_SPRAY | RESPIRATORY_TRACT | Status: DC | PRN

## 2013-09-11 MED ORDER — CEFDINIR 300 MG PO CAPS: 300.0000 mg | ORAL_CAPSULE | Freq: Two times a day (BID) | ORAL | Status: DC

## 2013-09-11 NOTE — Patient Instructions (Signed)
Script sent for antibiotic and refill script sent for your albuterol inhaler  Please let us hear if you don't clear up.

## 2013-09-11 NOTE — Progress Notes (Signed)
09/09/11- 76 yoF never smoker followed for allergic rhinitis, asthma, complicated by GERD/ Barretts, HBP LOV-September 10, 2010 She feels allergy vaccine is doing well. Has had no bad colds. Did get flu shot. We discussed probability of an early pollen season this year and will refill her medications. She has had no asthma/wheezing in a year. GERD symptoms have been well controlled using Gaviscon when needed.  09/10/12-  76 yoF never smoker followed for allergic rhinitis, asthma, complicated by GERD/ Barretts, HBP FOLLOWS FOR: started sneezing and coughing yesterday; overall had a good year last year; still on vaccine weekly and no reactions/issues.  Allergy vaccine 1:10 GO Dull right frontal headache for the last one or 2 days with some nasal congestion and sneezing No cough or wheeze. Nothing purulent.  09/10/13- 78 yoF never smoker followed for allergic rhinitis, asthma, complicated by GERD/ Barretts, HBP FOLLOWS FOR:  Sinus pressure and cough with brown mucus.  Vaccine doing well Allergic to "frosting on artificial Christmas tree". Credits amoxicillin for protecting her. Still some cough productive of clear mucus. Continues Allergy Vaccine 1:10 GO  ROS-see HPI Constitutional:   No-   weight loss, night sweats, fevers, chills, fatigue, lassitude. HEENT:   + headaches, no- difficulty swallowing, tooth/dental problems, sore throat,       +sneezing, no-itching, ear ache, +nasal congestion, post nasal drip,  CV:  No-   chest pain, orthopnea, PND, swelling in lower extremities, anasarca, dizziness, palpitations Resp: No-   shortness of breath with exertion or at rest.              No-   productive cough,  No non-productive cough,  No- coughing up of blood.              No-   change in color of mucus.  No- wheezing.   Skin: No-   rash or lesions. GI:  No-   heartburn, indigestion, abdominal pain, nausea, vomiting,  GU: MS:  No-   joint pain or swelling.   Neuro-     nothing unusual Psych:  No-  change in mood or affect. No depression or anxiety.  No memory loss.  OBJ General- Alert, Oriented, Affect-appropriate, Distress- none acute Skin- rash-none, lesions- none, excoriation- none Lymphadenopathy- none Head- atraumatic            Eyes- Gross vision intact, PERRLA, conjunctivae clear secretions            Ears- Hearing, canals-normal            Nose- + scab and mucus right nostril, no-Septal dev, mucus, polyps, erosion, perforation             Throat- Mallampati III , mucosa clear , drainage- none, tonsils- atrophic.+ Dentures Neck- flexible , old tracheostomy scar ( surgical complication of ?thyroid surgery?) , no stridor , thyroid nl, carotid no bruit Chest - symmetrical excursion , unlabored           Heart/CV- RRR , no murmur , no gallop  , no rub, nl s1 s2                           - JVD- none , edema- none, stasis changes- none, varices- none           Lung- clear to P&A, wheeze- none, cough- none , dullness-none, rub- none           Chest wall- sternotomy scar Abd- Br/ Gen/ Rectal- Not done,  not indicated Extrem- cyanosis- none, clubbing, none, atrophy- none, strength- nl Neuro- grossly intact to observation

## 2013-09-18 ENCOUNTER — Ambulatory Visit (INDEPENDENT_AMBULATORY_CARE_PROVIDER_SITE_OTHER): Payer: BC Managed Care – PPO

## 2013-09-18 DIAGNOSIS — J309 Allergic rhinitis, unspecified: Secondary | ICD-10-CM

## 2013-10-02 ENCOUNTER — Telehealth: Payer: Self-pay | Admitting: Internal Medicine

## 2013-10-02 NOTE — Telephone Encounter (Signed)
Ok to try amox 500 mg, # 21, 1 three times daily. This may not be a bacterial infection, in which case antibiotics won't work and she may do better with allegra or with Mucinex DM.

## 2013-10-02 NOTE — Telephone Encounter (Signed)
Spoke with pt. States that she finish Omnicef. Still having allergy symptoms. Reports sneezing, coughing, runny nose and wheezing. Would like Amoxicillin called in.  Allergies  Allergen Reactions  . Alendronate Sodium     REACTION: leg cramps  . Atorvastatin   . Ibandronate Sodium     REACTION: gi upset, and sluggish  . Prednisone   . Ramipril   . Ramipril     Throat, mouth, and lip swelling  . Statins     REACTION: myalgias    Current Outpatient Prescriptions on File Prior to Visit  Medication Sig Dispense Refill  . albuterol (PROVENTIL HFA) 108 (90 BASE) MCG/ACT inhaler Inhale 2 puffs into the lungs every 4 (four) hours as needed for wheezing or shortness of breath.  1 Inhaler  prn  . amitriptyline (ELAVIL) 25 MG tablet Take 12.5 mg by mouth at bedtime as needed.      Marland Kitchen. aspirin 81 MG tablet Take 81 mg by mouth daily.      . cefdinir (OMNICEF) 300 MG capsule Take 1 capsule (300 mg total) by mouth 2 (two) times daily.  14 capsule  0  . flunisolide (NASAREL) 29 MCG/ACT (0.025%) nasal spray Place 2 sprays into the nose 2 (two) times daily. Dose is for each nostril.  25 mL  prn  . hydrochlorothiazide (HYDRODIURIL) 25 MG tablet take 1/2 tablet by mouth once daily  45 tablet  3  . Niacin CR 750 MG TBCR Take 1 tablet (750 mg total) by mouth daily.  90 each  3  . Omega-3 Fatty Acids (FISH OIL) 1200 MG CAPS Take 1 capsule by mouth daily.      Marland Kitchen. omeprazole (PRILOSEC) 20 MG capsule Take 20 mg by mouth daily as needed.      . vitamin E 400 UNIT capsule Take 400 Units by mouth daily.       No current facility-administered medications on file prior to visit.    CY - please advise. Thanks.

## 2013-10-02 NOTE — Telephone Encounter (Signed)
atc na

## 2013-10-03 MED ORDER — AMOXICILLIN 500 MG PO CAPS: 500.0000 mg | ORAL_CAPSULE | Freq: Three times a day (TID) | ORAL | Status: DC

## 2013-10-03 NOTE — Telephone Encounter (Signed)
Spoke with pt. Aware of recs. RX has been sent. Nothing further needed 

## 2013-10-05 NOTE — Assessment & Plan Note (Signed)
Rhinitis versus sinusitis Plan-cefdinir, saline rinse, continue allergy vaccine 1:10

## 2013-10-05 NOTE — Assessment & Plan Note (Signed)
Plan-refill albuterol rescue inhaler, continue allergy vaccine

## 2013-10-08 ENCOUNTER — Other Ambulatory Visit: Payer: Self-pay | Admitting: Internal Medicine

## 2013-10-09 ENCOUNTER — Encounter: Payer: BC Managed Care – PPO | Admitting: Internal Medicine

## 2013-11-20 ENCOUNTER — Other Ambulatory Visit (INDEPENDENT_AMBULATORY_CARE_PROVIDER_SITE_OTHER): Payer: BC Managed Care – PPO

## 2013-11-20 DIAGNOSIS — Z0000 Encounter for general adult medical examination without abnormal findings: Secondary | ICD-10-CM

## 2013-11-21 ENCOUNTER — Telehealth: Payer: Self-pay

## 2013-11-21 DIAGNOSIS — Z0000 Encounter for general adult medical examination without abnormal findings: Secondary | ICD-10-CM

## 2013-11-21 LAB — LIPID PANEL
CHOL/HDL RATIO: 4
CHOLESTEROL: 211 mg/dL — AB (ref 0–200)
HDL: 48.2 mg/dL (ref >39.00)
LDL CALC: 120 mg/dL — AB (ref 0–99)
TRIGLYCERIDES: 216 mg/dL — AB (ref 0.0–149.0)
VLDL: 43.2 mg/dL — AB (ref 0.0–40.0)

## 2013-11-21 LAB — BASIC METABOLIC PANEL
BUN: 15 mg/dL (ref 6–23)
CHLORIDE: 101 meq/L (ref 96–112)
CO2: 32 mEq/L (ref 19–32)
Calcium: 9.7 mg/dL (ref 8.4–10.5)
Creatinine, Ser: 0.7 mg/dL (ref 0.4–1.2)
GFR: 90.84 mL/min (ref >60.00)
Glucose, Bld: 38 mg/dL — CL (ref 70–99)
POTASSIUM: 4 meq/L (ref 3.5–5.1)
Sodium: 142 mEq/L (ref 135–145)

## 2013-11-21 LAB — URINALYSIS, ROUTINE W REFLEX MICROSCOPIC
BILIRUBIN URINE: NEGATIVE
HGB URINE DIPSTICK: NEGATIVE
Ketones, ur: NEGATIVE
Leukocytes, UA: NEGATIVE
Nitrite: NEGATIVE
PH: 5.5 (ref 5.0–8.0)
Total Protein, Urine: NEGATIVE
UROBILINOGEN UA: 0.2 (ref 0.0–1.0)
Urine Glucose: NEGATIVE

## 2013-11-21 LAB — CBC WITH DIFFERENTIAL/PLATELET
Basophils Absolute: 0.1 10*3/uL (ref 0.0–0.1)
Basophils Relative: 1.6 % (ref 0.0–3.0)
EOS ABS: 0.4 10*3/uL (ref 0.0–0.7)
Eosinophils Relative: 7.2 % — ABNORMAL HIGH (ref 0.0–5.0)
HCT: 40.3 % (ref 36.0–46.0)
Hemoglobin: 13.5 g/dL (ref 12.0–15.0)
LYMPHS PCT: 39.8 % (ref 12.0–46.0)
Lymphs Abs: 2 10*3/uL (ref 0.7–4.0)
MCHC: 33.6 g/dL (ref 30.0–36.0)
MCV: 103.3 fl — ABNORMAL HIGH (ref 78.0–100.0)
Monocytes Absolute: 0.4 10*3/uL (ref 0.1–1.0)
Monocytes Relative: 7.7 % (ref 3.0–12.0)
NEUTROS PCT: 43.7 % (ref 43.0–77.0)
Neutro Abs: 2.2 10*3/uL (ref 1.4–7.7)
Platelets: 262 10*3/uL (ref 150.0–400.0)
RBC: 3.9 Mil/uL (ref 3.87–5.11)
RDW: 13.5 % (ref 11.5–14.6)
WBC: 4.9 10*3/uL (ref 4.5–10.5)

## 2013-11-21 LAB — TSH: TSH: 1.81 u[IU]/mL (ref 0.35–5.50)

## 2013-11-21 NOTE — Telephone Encounter (Signed)
The patient results were from labs done at our lab this morning in preparation for upcoming appointment with Dr. Jonny RuizJohn on 11/27/13.  Advise if she needs to return to be rechecked?

## 2013-11-21 NOTE — Telephone Encounter (Signed)
CPX labs  

## 2013-11-21 NOTE — Telephone Encounter (Signed)
Critical sugar of 38 reported . Chart reviewed; no history of diabetes. On no medications which should cause hypoglycemia. I recommend that the glucose be rechecked with a different glucometer. I would recommend she be seen due to advanced age and is extremely low glucose.

## 2013-11-21 NOTE — Telephone Encounter (Signed)
38 warrants recheck If that low needs work up for insulinoma

## 2013-11-21 NOTE — Telephone Encounter (Signed)
Critical lab Glucose of 38, please advise.

## 2013-11-22 NOTE — Telephone Encounter (Signed)
Patient informed of PCP instructions on lab result (see result note).  Patient does at this time feel jittery after eating a good breakfast 20 mins. Ago.

## 2013-11-27 ENCOUNTER — Ambulatory Visit (INDEPENDENT_AMBULATORY_CARE_PROVIDER_SITE_OTHER): Payer: BC Managed Care – PPO | Admitting: Internal Medicine

## 2013-11-27 ENCOUNTER — Encounter: Payer: Self-pay | Admitting: Internal Medicine

## 2013-11-27 VITALS — BP 160/72 | HR 78 | Temp 98.5°F | Wt 142.1 lb

## 2013-11-27 DIAGNOSIS — Z136 Encounter for screening for cardiovascular disorders: Secondary | ICD-10-CM

## 2013-11-27 DIAGNOSIS — Z0000 Encounter for general adult medical examination without abnormal findings: Secondary | ICD-10-CM

## 2013-11-27 DIAGNOSIS — Z23 Encounter for immunization: Secondary | ICD-10-CM

## 2013-11-27 NOTE — Assessment & Plan Note (Signed)

## 2013-11-27 NOTE — Progress Notes (Signed)
Subjective:    Patient ID: Sierra Robbins, female    DOB: 1935-05-04, 79 y.o.   MRN: [id number]  HPI  Here for wellness and f/u;  Overall doing ok;  Pt denies CP, worsening SOB, DOE, wheezing, orthopnea, PND, worsening LE edema, palpitations, dizziness or syncope.  Pt denies neurological change such as new headache, facial or extremity weakness.  Pt denies polydipsia, polyuria, or low sugar symptoms. Pt states overall good compliance with treatment and medications, good tolerability, and has been trying to follow lower cholesterol diet.  Pt denies worsening depressive symptoms, suicidal ideation or panic. No fever, night sweats, wt loss, loss of appetite, or other constitutional symptoms.  Pt states good ability with ADL's, has low fall risk, home safety reviewed and adequate, no other significant changes in hearing or vision, and only occasionally active with exercise. 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 No complaints. Very energetic for age Past Medical History  Diagnosis Date  . GERD (gastroesophageal reflux disease)   . Barrett's esophagus   . Allergic rhinitis   . Asthma   . Hyperlipidemia   . Osteoporosis   . DJD (degenerative joint disease)     hand  . Hypertension   . COPD (chronic obstructive pulmonary disease)   . History of shingles     Left upper arm/axilla  . Vitamin D deficiency    Past Surgical History  Procedure Laterality Date  . Appendectomy    . Vesicovaginal fistula closure w/ tah    . Cholecystectomy    . Nasal septum surgery    . Tracheostomy      s/p temp; at 79 yo for croup    reports that she has never smoked. She does not have any smokeless tobacco history on file. She reports that she drinks alcohol. She reports that she does not use illicit drugs. family history includes Heart attack in her mother; Rheum arthritis in her sister; Stomach cancer in her maternal grandfather; Stroke in her father. Allergies  Allergen Reactions  . Alendronate  Sodium     REACTION: leg cramps  . Atorvastatin   . Ibandronate Sodium     REACTION: gi upset, and sluggish  . Prednisone   . Ramipril   . Ramipril     Throat, mouth, and lip swelling  . Statins     REACTION: myalgias   Current Outpatient Prescriptions on File Prior to Visit  Medication Sig Dispense Refill  . albuterol (PROVENTIL HFA) 108 (90 BASE) MCG/ACT inhaler Inhale 2 puffs into the lungs every 4 (four) hours as needed for wheezing or shortness of breath.  1 Inhaler  prn  . amitriptyline (ELAVIL) 25 MG tablet Take 12.5 mg by mouth at bedtime as needed.      Marland Kitchen aspirin 81 MG tablet Take 81 mg by mouth daily.      . cefdinir (OMNICEF) 300 MG capsule Take 1 capsule (300 mg total) by mouth 2 (two) times daily.  14 capsule  0  . flunisolide (NASAREL) 29 MCG/ACT (0.025%) nasal spray Place 2 sprays into the nose 2 (two) times daily. Dose is for each nostril.  25 mL  prn  . hydrochlorothiazide (HYDRODIURIL) 25 MG tablet TAKE 1/2 TABLET BY MOUTH ONCE DAILY  45 tablet  3  . Niacin CR 750 MG TBCR Take 1 tablet (750 mg total) by mouth daily.  90 each  3  . Omega-3 Fatty Acids (FISH OIL) 1200 MG CAPS Take 1 capsule by mouth daily.      Marland Kitchen  omeprazole (PRILOSEC) 20 MG capsule Take 20 mg by mouth daily as needed.      . vitamin E 400 UNIT capsule Take 400 Units by mouth daily.       No current facility-administered medications on file prior to visit.   Review of Systems Constitutional: Negative for diaphoresis, activity change, appetite change or unexpected weight change.  HENT: Negative for hearing loss, ear pain, facial swelling, mouth sores and neck stiffness.   Eyes: Negative for pain, redness and visual disturbance.  Respiratory: Negative for shortness of breath and wheezing.   Cardiovascular: Negative for chest pain and palpitations.  Gastrointestinal: Negative for diarrhea, blood in stool, abdominal distention or other pain Genitourinary: Negative for hematuria, flank pain or change in  urine volume.  Musculoskeletal: Negative for myalgias and joint swelling.  Skin: Negative for color change and wound.  Neurological: Negative for syncope and numbness. other than noted Hematological: Negative for adenopathy.  Psychiatric/Behavioral: Negative for hallucinations, self-injury, decreased concentration and agitation.      Objective:   Physical Exam BP 160/72  Pulse 78  Temp(Src) 98.5 F (36.9 C) (Oral)  Wt 142 lb 2 oz (64.467 kg)  SpO2 95% VS noted,  Constitutional: Pt is oriented to person, place, and time. Appears well-developed and well-nourished.  Head: Normocephalic and atraumatic.  Right Ear: External ear normal.  Left Ear: External ear normal.  Nose: Nose normal.  Mouth/Throat: Oropharynx is clear and moist.  Eyes: Conjunctivae and EOM are normal. Pupils are equal, round, and reactive to light.  Neck: Normal range of motion. Neck supple. No JVD present. No tracheal deviation present.  Cardiovascular: Normal rate, regular rhythm, normal heart sounds and intact distal pulses.   Pulmonary/Chest: Effort normal and breath sounds normal.  Abdominal: Soft. Bowel sounds are normal. There is no tenderness. No HSM  Musculoskeletal: Normal range of motion. Exhibits no edema.  Lymphadenopathy:  Has no cervical adenopathy.  Neurological: Pt is alert and oriented to person, place, and time. Pt has normal reflexes. No cranial nerve deficit.  Skin: Skin is warm and dry. No rash noted.  Psychiatric:  Has  normal mood and affect. Behavior is normal.     Assessment & Plan:

## 2013-11-27 NOTE — Addendum Note (Signed)
Addended by: Scharlene GlossEWING, Lucresia Simic B on: 11/27/2013 03:00 PM   Modules accepted: Orders

## 2013-11-27 NOTE — Progress Notes (Signed)
Pre visit review using our clinic review tool, if applicable. No additional management support is needed unless otherwise documented below in the visit note. 

## 2013-11-27 NOTE — Patient Instructions (Addendum)
You had the new Prevnar pneumonia shot today  Your EKG was OK today, as well as your lab work.  \Please continue all other medications as before, and refills have been done if requested. Please have the pharmacy call with any other refills you may need.  Please continue your efforts at being more active, low cholesterol diet, and weight control. You are otherwise up to date with prevention measures today.  Please keep your appointments with your specialists as you may have planned  You will be contacted regarding the referral for: mammogram - at [facility], and screening colonoscopy  Please remember to sign up for MyChart if you have not done so, as this will be important to you in the future with finding out test results, communicating by private email, and scheduling acute appointments online when needed.  Please return in 1 year for your yearly visit, or sooner if needed, with Lab testing done 3-5 days before

## 2013-12-05 ENCOUNTER — Encounter: Payer: Self-pay | Admitting: Internal Medicine

## 2013-12-10 ENCOUNTER — Other Ambulatory Visit: Payer: Self-pay | Admitting: Internal Medicine

## 2013-12-10 ENCOUNTER — Ambulatory Visit (HOSPITAL_COMMUNITY)
Admission: RE | Admit: 2013-12-10 | Discharge: 2013-12-10 | Disposition: A | Discharge status: Home / Self Care | Payer: BC Managed Care – PPO | Source: Ambulatory Visit | Attending: Internal Medicine | Admitting: Internal Medicine

## 2013-12-10 DIAGNOSIS — Z1231 Encounter for screening mammogram for malignant neoplasm of breast: Secondary | ICD-10-CM | POA: Insufficient documentation

## 2013-12-10 DIAGNOSIS — Z136 Encounter for screening for cardiovascular disorders: Secondary | ICD-10-CM

## 2014-01-23 ENCOUNTER — Encounter: Payer: BC Managed Care – PPO | Admitting: Internal Medicine

## 2014-02-06 ENCOUNTER — Ambulatory Visit (INDEPENDENT_AMBULATORY_CARE_PROVIDER_SITE_OTHER): Payer: BC Managed Care – PPO

## 2014-02-06 DIAGNOSIS — J309 Allergic rhinitis, unspecified: Secondary | ICD-10-CM

## 2014-05-12 ENCOUNTER — Telehealth: Payer: Self-pay | Admitting: Internal Medicine

## 2014-05-12 MED ORDER — AMOXICILLIN 500 MG PO TABS: 500.0000 mg | ORAL_TABLET | Freq: Three times a day (TID) | ORAL | Status: DC

## 2014-05-12 NOTE — Telephone Encounter (Signed)
Spoke with the pt and notified of recs per CDY  Pt verbalized understanding  Rx was sent   

## 2014-05-12 NOTE — Telephone Encounter (Signed)
Called spoke with pt. She is requesting a refill on amox. C/o allergies x last week. C/o sneezing, prod cough-thick clear phlem, PND, nasal congestion.  Please advise CDY thanks  Allergies  Allergen Reactions  . Alendronate Sodium     REACTION: leg cramps  . Atorvastatin   . Ibandronate Sodium     REACTION: gi upset, and sluggish  . Prednisone   . Ramipril   . Ramipril     Throat, mouth, and lip swelling  . Statins     REACTION: myalgias     Current Outpatient Prescriptions on File Prior to Visit  Medication Sig Dispense Refill  . albuterol (PROVENTIL HFA) 108 (90 BASE) MCG/ACT inhaler Inhale 2 puffs into the lungs every 4 (four) hours as needed for wheezing or shortness of breath.  1 Inhaler  prn  . amitriptyline (ELAVIL) 25 MG tablet Take 12.5 mg by mouth at bedtime as needed.      Marland Kitchen aspirin 81 MG tablet Take 81 mg by mouth daily.      . cefdinir (OMNICEF) 300 MG capsule Take 1 capsule (300 mg total) by mouth 2 (two) times daily.  14 capsule  0  . flunisolide (NASAREL) 29 MCG/ACT (0.025%) nasal spray Place 2 sprays into the nose 2 (two) times daily. Dose is for each nostril.  25 mL  prn  . hydrochlorothiazide (HYDRODIURIL) 25 MG tablet TAKE 1/2 TABLET BY MOUTH ONCE DAILY  45 tablet  3  . Niacin CR 750 MG TBCR Take 1 tablet (750 mg total) by mouth daily.  90 each  3  . Omega-3 Fatty Acids (FISH OIL) 1200 MG CAPS Take 1 capsule by mouth daily.      Marland Kitchen omeprazole (PRILOSEC) 20 MG capsule Take 20 mg by mouth daily as needed.      . vitamin E 400 UNIT capsule Take 400 Units by mouth daily.       No current facility-administered medications on file prior to visit.

## 2014-05-12 NOTE — Telephone Encounter (Signed)
Ok to refill amox 500 mg, # 21, 1 TID  Ref x 2

## 2014-07-04 ENCOUNTER — Encounter: Payer: Self-pay | Admitting: Internal Medicine

## 2014-07-18 ENCOUNTER — Ambulatory Visit (INDEPENDENT_AMBULATORY_CARE_PROVIDER_SITE_OTHER): Payer: BC Managed Care – PPO

## 2014-07-18 DIAGNOSIS — J309 Allergic rhinitis, unspecified: Secondary | ICD-10-CM

## 2014-09-11 ENCOUNTER — Ambulatory Visit (INDEPENDENT_AMBULATORY_CARE_PROVIDER_SITE_OTHER): Payer: Medicare Other | Admitting: Internal Medicine

## 2014-09-11 ENCOUNTER — Encounter: Payer: Self-pay | Admitting: Internal Medicine

## 2014-09-11 VITALS — BP 136/74 | HR 79 | Ht 61.0 in | Wt 146.8 lb

## 2014-09-11 DIAGNOSIS — J302 Other seasonal allergic rhinitis: Secondary | ICD-10-CM

## 2014-09-11 DIAGNOSIS — J3089 Other allergic rhinitis: Principal | ICD-10-CM

## 2014-09-11 DIAGNOSIS — J4520 Mild intermittent asthma, uncomplicated: Secondary | ICD-10-CM

## 2014-09-11 DIAGNOSIS — J309 Allergic rhinitis, unspecified: Secondary | ICD-10-CM

## 2014-09-11 MED ORDER — ALBUTEROL SULFATE HFA 108 (90 BASE) MCG/ACT IN AERS: INHALATION_SPRAY | RESPIRATORY_TRACT | Status: DC

## 2014-09-11 MED ORDER — AMOXICILLIN 500 MG PO TABS: 500.0000 mg | ORAL_TABLET | Freq: Three times a day (TID) | ORAL | Status: DC

## 2014-09-11 NOTE — Patient Instructions (Signed)
We can continue allergy vaccine 1:10 GO  Refill scripts sent for Proair rescue inhaler and for amoxacillin to hold  Please call as needed

## 2014-09-11 NOTE — Progress Notes (Signed)
09/09/11- 76 yoF never smoker followed for allergic rhinitis, asthma, complicated by GERD/ Barretts, HBP LOV-September 10, 2010 She feels allergy vaccine is doing well. Has had no bad colds. Did get flu shot. We discussed probability of an early pollen season this year and will refill her medications. She has had no asthma/wheezing in a year. GERD symptoms have been well controlled using Gaviscon when needed.  09/10/12-  76 yoF never smoker followed for allergic rhinitis, asthma, complicated by GERD/ Barretts, HBP FOLLOWS FOR: started sneezing and coughing yesterday; overall had a good year last year; still on vaccine weekly and no reactions/issues.  Allergy vaccine 1:10 GO Dull right frontal headache for the last one or 2 days with some nasal congestion and sneezing No cough or wheeze. Nothing purulent.  09/10/13- 78 yoF never smoker followed for allergic rhinitis, asthma, complicated by GERD/ Barretts, HBP FOLLOWS FOR:  Sinus pressure and cough with brown mucus.  Vaccine doing well Allergic to "frosting on artificial Christmas tree". Credits amoxicillin for protecting her. Still some cough productive of clear mucus. Continues Allergy Vaccine 1:10 GO  09/11/14-  78 yoF never smoker followed for allergic rhinitis, asthma, complicated by GERD/ Barretts, HBP Continues Allergy Vaccine 1:10 GO FOLLOWS FOR: Still on vaccine and doing well overall. She mentions "drippy nose" in cold air. Fall allergy season is her worst.  ROS-see HPI Constitutional:   No-   weight loss, night sweats, fevers, chills, fatigue, lassitude. HEENT:   + headaches, no- difficulty swallowing, tooth/dental problems, sore throat,       +sneezing, no-itching, ear ache, +nasal congestion, post nasal drip,  CV:  No-   chest pain, orthopnea, PND, swelling in lower extremities, anasarca, dizziness, palpitations Resp: No-   shortness of breath with exertion or at rest.              No-   productive cough,  No non-productive cough,  No-  coughing up of blood.              No-   change in color of mucus.  No- wheezing.   Skin: No-   rash or lesions. GI:  No-   heartburn, indigestion, abdominal pain, nausea, vomiting,  GU: MS:  No-   joint pain or swelling.   Neuro-     nothing unusual Psych:  No- change in mood or affect. No depression or anxiety.  No memory loss.  OBJ General- Alert, Oriented, Affect-appropriate, Distress- none acute Skin- rash-none, lesions- none, excoriation- none Lymphadenopathy- none Head- atraumatic            Eyes- Gross vision intact, PERRLA, conjunctivae clear secretions            Ears- Hearing, canals-normal            Nose- , no-Septal dev, mucus, polyps, erosion, perforation             Throat- Mallampati III , mucosa clear , drainage- none, tonsils- atrophic.                     + Dentures Neck- flexible , old tracheostomy scar ( surgical complication of ?thyroid surgery?) , no stridor , thyroid nl, carotid no bruit Chest - symmetrical excursion , unlabored           Heart/CV- RRR , no murmur , no gallop  , no rub, nl s1 s2                           -  JVD- none , edema- none, stasis changes- none, varices- none           Lung- clear to P&A, wheeze- none, cough- none , dullness-none, rub- none           Chest wall- sternotomy scar Abd- Br/ Gen/ Rectal- Not done, not indicated Extrem- cyanosis- none, clubbing, none, atrophy- none, strength- nl Neuro- grossly intact to observation

## 2014-09-13 NOTE — Assessment & Plan Note (Signed)
Adequate control. She is satisfied to continue present treatment. She asks amoxicillin to hold Plan-continue allergy vaccine

## 2014-09-13 NOTE — Assessment & Plan Note (Signed)
Mild intermittent asthma well-controlled

## 2014-11-25 ENCOUNTER — Other Ambulatory Visit: Payer: Self-pay

## 2014-11-25 ENCOUNTER — Other Ambulatory Visit (INDEPENDENT_AMBULATORY_CARE_PROVIDER_SITE_OTHER): Payer: Medicare Other

## 2014-11-25 DIAGNOSIS — Z0000 Encounter for general adult medical examination without abnormal findings: Secondary | ICD-10-CM

## 2014-11-25 LAB — LIPID PANEL
Cholesterol: 198 mg/dL (ref 0–200)
HDL: 41.3 mg/dL (ref >39.00)
NonHDL: 156.7
Total CHOL/HDL Ratio: 5
Triglycerides: 257 mg/dL — ABNORMAL HIGH (ref 0.0–149.0)
VLDL: 51.4 mg/dL — ABNORMAL HIGH (ref 0.0–40.0)

## 2014-11-25 LAB — CBC WITH DIFFERENTIAL/PLATELET
BASOS ABS: 0 10*3/uL (ref 0.0–0.1)
BASOS PCT: 0.9 % (ref 0.0–3.0)
Eosinophils Absolute: 0.4 10*3/uL (ref 0.0–0.7)
Eosinophils Relative: 7.9 % — ABNORMAL HIGH (ref 0.0–5.0)
HEMATOCRIT: 38.6 % (ref 36.0–46.0)
Hemoglobin: 13.6 g/dL (ref 12.0–15.0)
Lymphocytes Relative: 36 % (ref 12.0–46.0)
Lymphs Abs: 1.8 10*3/uL (ref 0.7–4.0)
MCHC: 35.1 g/dL (ref 30.0–36.0)
MCV: 99.4 fl (ref 78.0–100.0)
MONO ABS: 0.4 10*3/uL (ref 0.1–1.0)
MONOS PCT: 8.5 % (ref 3.0–12.0)
NEUTROS ABS: 2.3 10*3/uL (ref 1.4–7.7)
NEUTROS PCT: 46.7 % (ref 43.0–77.0)
PLATELETS: 251 10*3/uL (ref 150.0–400.0)
RBC: 3.89 Mil/uL (ref 3.87–5.11)
RDW: 12.9 % (ref 11.5–15.5)
WBC: 5 10*3/uL (ref 4.0–10.5)

## 2014-11-25 LAB — URINALYSIS, ROUTINE W REFLEX MICROSCOPIC
Bilirubin Urine: NEGATIVE
Ketones, ur: NEGATIVE
Leukocytes, UA: NEGATIVE
NITRITE: NEGATIVE
SPECIFIC GRAVITY, URINE: 1.025 (ref 1.000–1.030)
TOTAL PROTEIN, URINE-UPE24: NEGATIVE
Urine Glucose: NEGATIVE
Urobilinogen, UA: 0.2 (ref 0.0–1.0)
pH: 6 (ref 5.0–8.0)

## 2014-11-25 LAB — HEPATIC FUNCTION PANEL
ALT: 14 U/L (ref 0–35)
AST: 18 U/L (ref 0–37)
Albumin: 4 g/dL (ref 3.5–5.2)
Alkaline Phosphatase: 58 U/L (ref 39–117)
Bilirubin, Direct: 0.1 mg/dL (ref 0.0–0.3)
Total Bilirubin: 0.7 mg/dL (ref 0.2–1.2)
Total Protein: 7.3 g/dL (ref 6.0–8.3)

## 2014-11-25 LAB — BASIC METABOLIC PANEL
BUN: 13 mg/dL (ref 6–23)
CO2: 29 mEq/L (ref 19–32)
Calcium: 9.4 mg/dL (ref 8.4–10.5)
Chloride: 103 mEq/L (ref 96–112)
Creatinine, Ser: 0.64 mg/dL (ref 0.40–1.20)
GFR: 95.52 mL/min (ref >60.00)
Glucose, Bld: 99 mg/dL (ref 70–99)
Potassium: 4.2 mEq/L (ref 3.5–5.1)
Sodium: 140 mEq/L (ref 135–145)

## 2014-11-25 LAB — LDL CHOLESTEROL, DIRECT: LDL DIRECT: 110 mg/dL

## 2014-11-25 LAB — TSH: TSH: 1.53 u[IU]/mL (ref 0.35–4.50)

## 2014-12-01 LAB — HM MAMMOGRAPHY

## 2014-12-03 ENCOUNTER — Encounter: Payer: Self-pay | Admitting: Internal Medicine

## 2014-12-03 ENCOUNTER — Telehealth: Payer: Self-pay

## 2014-12-03 ENCOUNTER — Ambulatory Visit (INDEPENDENT_AMBULATORY_CARE_PROVIDER_SITE_OTHER): Payer: Medicare Other | Admitting: Internal Medicine

## 2014-12-03 ENCOUNTER — Telehealth: Payer: Self-pay | Admitting: Internal Medicine

## 2014-12-03 DIAGNOSIS — Z0000 Encounter for general adult medical examination without abnormal findings: Secondary | ICD-10-CM

## 2014-12-03 DIAGNOSIS — M6530 Trigger finger, unspecified finger: Secondary | ICD-10-CM | POA: Diagnosis not present

## 2014-12-03 MED ORDER — AMITRIPTYLINE HCL 25 MG PO TABS: 12.5000 mg | ORAL_TABLET | Freq: Every evening | ORAL | Status: DC | PRN

## 2014-12-03 MED ORDER — DICLOFENAC SODIUM 1 % TD GEL: 4.0000 g | Freq: Four times a day (QID) | TRANSDERMAL | Status: DC

## 2014-12-03 MED ORDER — HYDROCHLOROTHIAZIDE 25 MG PO TABS: 12.5000 mg | ORAL_TABLET | Freq: Every day | ORAL | Status: DC

## 2014-12-03 NOTE — Telephone Encounter (Signed)
Patient would like labs entered for next years cpe a week before.

## 2014-12-03 NOTE — Telephone Encounter (Signed)
Labs entered.../lmb 

## 2014-12-03 NOTE — Assessment & Plan Note (Signed)
For voltaren gel prn, and directed pt to consider seeing Dr Smith/sports medicine

## 2014-12-03 NOTE — Assessment & Plan Note (Signed)

## 2014-12-03 NOTE — Patient Instructions (Signed)
Please continue all other medications as before, and refills have been done if requested.  Please have the pharmacy call with any other refills you may need.  Please continue your efforts at being more active, low cholesterol diet, and weight control.  You are otherwise up to date with prevention measures today.  Please keep your appointments with your specialists as you may have planned  Please return in 1 year for your yearly visit, or sooner if needed, with Lab testing done 3-5 days before  

## 2014-12-03 NOTE — Progress Notes (Signed)
Subjective:    Patient ID: Sierra Robbins, female    DOB: [birth year], 80 y.o.   MRN: [id number]  HPI  Here for wellness and f/u;  Overall doing ok;  Pt denies Chest pain, worsening SOB, DOE, wheezing, orthopnea, PND, worsening LE edema, palpitations, dizziness or syncope.  Pt denies neurological change such as new headache, facial or extremity weakness.  Pt denies polydipsia, polyuria, or low sugar symptoms. Pt states overall good compliance with treatment and medications, good tolerability, and has been trying to follow appropriate diet.  Pt denies worsening depressive symptoms, suicidal ideation or panic. No fever, night sweats, wt loss, loss of appetite, or other constitutional symptoms.  Pt states good ability with ADL's, has low fall risk, home safety reviewed and adequate, no other significant changes in hearing or vision, and only occasionally active with exercise. Wt Readings from Last 3 Encounters:  12/03/14 144 lb 1.9 oz (65.372 kg)  09/11/14 146 lb 12.8 oz (66.588 kg)  11/27/13 142 lb 2 oz (64.467 kg)  Does have OA and tendon problems , only to both 3rd fingers, with pain to bith MCP s and with some sense of "locking" and hard to extend/flex the fingers.   Past Medical History  Diagnosis Date  . GERD (gastroesophageal reflux disease)   . Barrett's esophagus   . Allergic rhinitis   . Asthma   . Hyperlipidemia   . Osteoporosis   . DJD (degenerative joint disease)     hand  . Hypertension   . COPD (chronic obstructive pulmonary disease)   . History of shingles     Left upper arm/axilla  . Vitamin D deficiency    Past Surgical History  Procedure Laterality Date  . Appendectomy    . Vesicovaginal fistula closure w/ tah    . Cholecystectomy    . Nasal septum surgery    . Tracheostomy      s/p temp; at 80 yo for croup    reports that she has never smoked. She does not have any smokeless tobacco history on file. She reports that she drinks alcohol. She reports that she does  not use illicit drugs. family history includes Heart attack in her mother; Rheum arthritis in her sister; Stomach cancer in her maternal grandfather; Stroke in her father. Allergies  Allergen Reactions  . Alendronate Sodium     REACTION: leg cramps  . Atorvastatin   . Ibandronate Sodium     REACTION: gi upset, and sluggish  . Prednisone   . Ramipril     Throat, mouth, and lip swelling  . Statins     REACTION: myalgias   Current Outpatient Prescriptions on File Prior to Visit  Medication Sig Dispense Refill  . albuterol (PROAIR HFA) 108 (90 BASE) MCG/ACT inhaler 2 puffs every 4 hours as directed 1 Inhaler prn  . albuterol (PROVENTIL HFA) 108 (90 BASE) MCG/ACT inhaler Inhale 2 puffs into the lungs every 4 (four) hours as needed for wheezing or shortness of breath. 1 Inhaler prn  . amitriptyline (ELAVIL) 25 MG tablet Take 12.5 mg by mouth at bedtime as needed.    Marland Kitchen. aspirin 81 MG tablet Take 81 mg by mouth daily.    . flunisolide (NASAREL) 29 MCG/ACT (0.025%) nasal spray Place 2 sprays into the nose 2 (two) times daily. Dose is for each nostril. 25 mL prn  . Multiple Vitamin (MULTIVITAMIN) tablet Take 1 tablet by mouth daily.    . Niacin CR 750 MG TBCR Take 1 tablet (750  mg total) by mouth daily. 90 each 3  . Omega-3 Fatty Acids (FISH OIL) 1200 MG CAPS Take 1 capsule by mouth 2 (two) times a week.     Marland Kitchen omeprazole (PRILOSEC) 20 MG capsule Take 20 mg by mouth daily as needed.    . vitamin E 400 UNIT capsule Take 400 Units by mouth daily.     No current facility-administered medications on file prior to visit.   Review of Systems Constitutional: Negative for increased diaphoresis, other activity, appetite or siginficant weight change other than noted HENT: Negative for worsening hearing loss, ear pain, facial swelling, mouth sores and neck stiffness.   Eyes: Negative for other worsening pain, redness or visual disturbance.  Respiratory: Negative for shortness of breath and wheezing    Cardiovascular: Negative for chest pain and palpitations.  Gastrointestinal: Negative for diarrhea, blood in stool, abdominal distention or other pain Genitourinary: Negative for hematuria, flank pain or change in urine volume.  Musculoskeletal: Negative for myalgias or other joint complaints.  Skin: Negative for color change and wound or drainage.  Neurological: Negative for syncope and numbness. other than noted Hematological: Negative for adenopathy. or other swelling Psychiatric/Behavioral: Negative for hallucinations, SI, self-injury, decreased concentration or other worsening agitation.      Objective:   Physical Exam BP 134/82 mmHg  Pulse 78  Temp(Src) 98.1 F (36.7 C) (Oral)  Resp 18  Ht  (1.549 m)  Wt 144 lb 1.9 oz (65.372 kg)  BMI 27.25 kg/m2  SpO2 95% VS noted,  Constitutional: Pt is oriented to person, place, and time. Appears well-developed and well-nourished, in no significant distress Head: Normocephalic and atraumatic.  Right Ear: External ear normal.  Left Ear: External ear normal.  Nose: Nose normal.  Mouth/Throat: Oropharynx is clear and moist.  Eyes: Conjunctivae and EOM are normal. Pupils are equal, round, and reactive to light.  Neck: Normal range of motion. Neck supple. No JVD present. No tracheal deviation present or significant neck LA or mass Cardiovascular: Normal rate, regular rhythm, normal heart sounds and intact distal pulses.   Pulmonary/Chest: Effort normal and breath sounds without rales or wheezing  Abdominal: Soft. Bowel sounds are normal. NT. No HSM  Musculoskeletal: Normal range of motion. Exhibits no edema. except for bilat third fingers trigger like  Lymphadenopathy:  Has no cervical adenopathy.  Neurological: Pt is alert and oriented to person, place, and time. Pt has normal reflexes. No cranial nerve deficit. Motor grossly intact Skin: Skin is warm and dry. No rash noted.  Psychiatric:  Has normal mood and affect. Behavior is  normal.     Assessment & Plan:

## 2014-12-03 NOTE — Telephone Encounter (Signed)
EMR will only let me order in grams or tubes  How many grams or tubes would be needed for 31 days?

## 2014-12-03 NOTE — Telephone Encounter (Signed)
The pharmacist called and asked if you could please change the quantity of the Voltaren gel. As it is written now, she will need another refill in just 5 days and the copay is $48.51 each time. She said the the quantity could be changed to 5 boxes in order to last her 31 days at a time.

## 2014-12-18 ENCOUNTER — Ambulatory Visit: Payer: Medicare Other | Admitting: Family Medicine

## 2014-12-25 ENCOUNTER — Ambulatory Visit (INDEPENDENT_AMBULATORY_CARE_PROVIDER_SITE_OTHER): Payer: Medicare Other

## 2014-12-25 DIAGNOSIS — J309 Allergic rhinitis, unspecified: Secondary | ICD-10-CM | POA: Diagnosis not present

## 2015-04-22 ENCOUNTER — Encounter: Payer: Self-pay | Admitting: Internal Medicine

## 2015-06-04 ENCOUNTER — Ambulatory Visit (INDEPENDENT_AMBULATORY_CARE_PROVIDER_SITE_OTHER): Payer: Medicare Other

## 2015-06-04 ENCOUNTER — Telehealth: Payer: Self-pay | Admitting: Internal Medicine

## 2015-06-04 DIAGNOSIS — J309 Allergic rhinitis, unspecified: Secondary | ICD-10-CM | POA: Diagnosis not present

## 2015-06-04 NOTE — Telephone Encounter (Signed)
Allergy Serum Extract Date Mixed: 06/04/15 Vial: 1 Strength: 1:10 Here/Mail/Pick Up: mail Mixed By: tbs 

## 2015-09-14 ENCOUNTER — Encounter: Payer: Self-pay | Admitting: Internal Medicine

## 2015-09-14 ENCOUNTER — Ambulatory Visit (INDEPENDENT_AMBULATORY_CARE_PROVIDER_SITE_OTHER): Payer: Medicare Other | Admitting: Internal Medicine

## 2015-09-14 VITALS — BP 148/88 | HR 80 | Ht 61.0 in | Wt 147.4 lb

## 2015-09-14 DIAGNOSIS — J309 Allergic rhinitis, unspecified: Secondary | ICD-10-CM | POA: Diagnosis not present

## 2015-09-14 DIAGNOSIS — J4530 Mild persistent asthma, uncomplicated: Secondary | ICD-10-CM | POA: Diagnosis not present

## 2015-09-14 DIAGNOSIS — J302 Other seasonal allergic rhinitis: Secondary | ICD-10-CM

## 2015-09-14 DIAGNOSIS — J3089 Other allergic rhinitis: Secondary | ICD-10-CM

## 2015-09-14 MED ORDER — AMOXICILLIN 500 MG PO TABS: 500.0000 mg | ORAL_TABLET | Freq: Two times a day (BID) | ORAL | Status: DC

## 2015-09-14 MED ORDER — ALBUTEROL SULFATE HFA 108 (90 BASE) MCG/ACT IN AERS: 2.0000 | INHALATION_SPRAY | RESPIRATORY_TRACT | Status: DC | PRN

## 2015-09-14 NOTE — Progress Notes (Signed)
09/09/11- 76 yoF never smoker followed for allergic rhinitis, asthma, complicated by GERD/ Barretts, HBP LOV-September 10, 2010 She feels allergy vaccine is doing well. Has had no bad colds. Did get flu shot. We discussed probability of an early pollen season this year and will refill her medications. She has had no asthma/wheezing in a year. GERD symptoms have been well controlled using Gaviscon when needed.  09/10/12-  76 yoF never smoker followed for allergic rhinitis, asthma, complicated by GERD/ Barretts, HBP FOLLOWS FOR: started sneezing and coughing yesterday; overall had a good year last year; still on vaccine weekly and no reactions/issues.  Allergy vaccine 1:10 GO Dull right frontal headache for the last one or 2 days with some nasal congestion and sneezing No cough or wheeze. Nothing purulent.  09/10/13- 78 yoF never smoker followed for allergic rhinitis, asthma, complicated by GERD/ Barretts, HBP FOLLOWS FOR:  Sinus pressure and cough with brown mucus.  Vaccine doing well Allergic to "frosting on artificial Christmas tree". Credits amoxicillin for protecting her. Still some cough productive of clear mucus. Continues Allergy Vaccine 1:10 GO  09/11/14-  78 yoF never smoker followed for allergic rhinitis, asthma, complicated by GERD/ Barretts, HBP Continues Allergy Vaccine 1:10 GO FOLLOWS FOR: Still on vaccine and doing well overall. She mentions "drippy nose" in cold air. Fall allergy season is her worst.  09/14/2015-80 year old female never smoker followed for allergic rhinitis, asthma, complicated by GERD/Barrett's, HBP Allergy vaccine 1:10 GO Follows for: SAR/PAR. Pt states that her allergy symptoms are doing well. Pt states that she has used the amoxicillin prescription maybe twice this past year. Pt does report getting a "heavy" feeling in her chest when the weather is humid. She has been using Primatene tablets for this and this has been helping. Pt is on allergy injections and  reports no reactions.  Only occasional wheeze. Rarely takes one half of a Primatene tablet for chest tightness with good relief.  ROS-see HPI Constitutional:   No-   weight loss, night sweats, fevers, chills, fatigue, lassitude. HEENT:   + headaches, no- difficulty swallowing, tooth/dental problems, sore throat,       +sneezing, no-itching, ear ache, +nasal congestion, post nasal drip,  CV:  No-   chest pain, orthopnea, PND, swelling in lower extremities, anasarca, dizziness, palpitations Resp: No-   shortness of breath with exertion or at rest.              No-   productive cough,  No non-productive cough,  No- coughing up of blood.              No-   change in color of mucus.  No- wheezing.   Skin: No-   rash or lesions. GI:  No-   heartburn, indigestion, abdominal pain, nausea, vomiting,  GU: MS:  No-   joint pain or swelling.   Neuro-     nothing unusual Psych:  No- change in mood or affect. No depression or anxiety.  No memory loss.  OBJ General- Alert, Oriented, Affect-appropriate, Distress- none acute Skin- rash-none, lesions- none, excoriation- none Lymphadenopathy- none Head- atraumatic            Eyes- Gross vision intact, PERRLA, conjunctivae clear secretions            Ears- Hearing, canals-normal            Nose- , no-Septal dev, mucus, polyps, erosion, perforation             Throat- Mallampati III , mucosa  clear , drainage- none, tonsils- atrophic.                     + Dentures Neck- flexible , old tracheostomy scar ( surgical complication of ?thyroid surgery?) , no stridor , thyroid nl, carotid no bruit Chest - symmetrical excursion , unlabored           Heart/CV- RRR , no murmur , no gallop  , no rub, nl s1 s2                           - JVD- none , edema- none, stasis changes- none, varices- none           Lung- clear to P&A, wheeze- none, cough- none , dullness-none, rub- none           Chest wall- sternotomy scar Abd- Br/ Gen/ Rectal- Not done, not  indicated Extrem- cyanosis- none, clubbing, none, atrophy- none, strength- nl Neuro- grossly intact to observation

## 2015-09-14 NOTE — Patient Instructions (Signed)
We can continue allergy vaccine 1:10 GO for another year  Refill scripts sent for amoxacillin and for albuterol rescue inhaler to have available  Please call if we can help

## 2015-10-11 NOTE — Assessment & Plan Note (Signed)
Reviewed use of medications, rescue versus maintenance inhaler. Amoxicillin to hold.

## 2015-10-11 NOTE — Assessment & Plan Note (Signed)
Okay to continue allergy vaccine another year. Antihistamines if needed.

## 2015-11-06 ENCOUNTER — Other Ambulatory Visit: Payer: Self-pay

## 2015-11-06 MED ORDER — HYDROCHLOROTHIAZIDE 25 MG PO TABS: 12.5000 mg | ORAL_TABLET | Freq: Every day | ORAL | Status: DC

## 2015-11-20 ENCOUNTER — Telehealth: Payer: Self-pay | Admitting: Internal Medicine

## 2015-11-20 DIAGNOSIS — J309 Allergic rhinitis, unspecified: Secondary | ICD-10-CM | POA: Diagnosis not present

## 2015-11-20 NOTE — Telephone Encounter (Signed)
Allergy Serum Extract Date Mixed: 11/20/2015 Vial: A Strength: 1:10 Here/Mail/Pick Up: Mail Mixed By: Jaynee EaglesLindsay Lemons, CMA Last OV: 09/14/2015 Pending OV: 09/13/2016

## 2015-12-02 ENCOUNTER — Other Ambulatory Visit (INDEPENDENT_AMBULATORY_CARE_PROVIDER_SITE_OTHER): Payer: Medicare Other

## 2015-12-02 DIAGNOSIS — R7989 Other specified abnormal findings of blood chemistry: Secondary | ICD-10-CM | POA: Diagnosis not present

## 2015-12-02 DIAGNOSIS — Z0189 Encounter for other specified special examinations: Secondary | ICD-10-CM

## 2015-12-02 DIAGNOSIS — Z0000 Encounter for general adult medical examination without abnormal findings: Secondary | ICD-10-CM

## 2015-12-02 LAB — LDL CHOLESTEROL, DIRECT: Direct LDL: 106 mg/dL

## 2015-12-02 LAB — HEPATIC FUNCTION PANEL
ALK PHOS: 55 U/L (ref 39–117)
ALT: 15 U/L (ref 0–35)
AST: 20 U/L (ref 0–37)
Albumin: 4.1 g/dL (ref 3.5–5.2)
BILIRUBIN DIRECT: 0.1 mg/dL (ref 0.0–0.3)
BILIRUBIN TOTAL: 0.6 mg/dL (ref 0.2–1.2)
Total Protein: 7.1 g/dL (ref 6.0–8.3)

## 2015-12-02 LAB — CBC WITH DIFFERENTIAL/PLATELET
BASOS PCT: 1.1 % (ref 0.0–3.0)
Basophils Absolute: 0.1 10*3/uL (ref 0.0–0.1)
Eosinophils Absolute: 0.3 10*3/uL (ref 0.0–0.7)
Eosinophils Relative: 5.6 % — ABNORMAL HIGH (ref 0.0–5.0)
HEMATOCRIT: 37.3 % (ref 36.0–46.0)
Hemoglobin: 12.8 g/dL (ref 12.0–15.0)
LYMPHS ABS: 1.9 10*3/uL (ref 0.7–4.0)
LYMPHS PCT: 38.7 % (ref 12.0–46.0)
MCHC: 34.4 g/dL (ref 30.0–36.0)
MCV: 99.7 fl (ref 78.0–100.0)
MONOS PCT: 10.9 % (ref 3.0–12.0)
Monocytes Absolute: 0.6 10*3/uL (ref 0.1–1.0)
NEUTROS ABS: 2.2 10*3/uL (ref 1.4–7.7)
NEUTROS PCT: 43.7 % (ref 43.0–77.0)
PLATELETS: 234 10*3/uL (ref 150.0–400.0)
RBC: 3.74 Mil/uL — ABNORMAL LOW (ref 3.87–5.11)
RDW: 13 % (ref 11.5–15.5)
WBC: 5 10*3/uL (ref 4.0–10.5)

## 2015-12-02 LAB — URINALYSIS, ROUTINE W REFLEX MICROSCOPIC
Bilirubin Urine: NEGATIVE
Hgb urine dipstick: NEGATIVE
KETONES UR: NEGATIVE
Leukocytes, UA: NEGATIVE
Nitrite: NEGATIVE
PH: 7.5 (ref 5.0–8.0)
RBC / HPF: NONE SEEN (ref 0–?)
SPECIFIC GRAVITY, URINE: 1.015 (ref 1.000–1.030)
Total Protein, Urine: NEGATIVE
UROBILINOGEN UA: 0.2 (ref 0.0–1.0)
Urine Glucose: NEGATIVE
WBC UA: NONE SEEN (ref 0–?)

## 2015-12-02 LAB — LIPID PANEL
CHOL/HDL RATIO: 4
CHOLESTEROL: 191 mg/dL (ref 0–200)
HDL: 47.9 mg/dL (ref >39.00)
NonHDL: 143.58
TRIGLYCERIDES: 220 mg/dL — AB (ref 0.0–149.0)
VLDL: 44 mg/dL — AB (ref 0.0–40.0)

## 2015-12-02 LAB — BASIC METABOLIC PANEL
BUN: 13 mg/dL (ref 6–23)
CO2: 34 mEq/L — ABNORMAL HIGH (ref 19–32)
CREATININE: 0.67 mg/dL (ref 0.40–1.20)
Calcium: 9.4 mg/dL (ref 8.4–10.5)
Chloride: 100 mEq/L (ref 96–112)
GFR: 90.36 mL/min (ref >60.00)
Glucose, Bld: 99 mg/dL (ref 70–99)
Potassium: 3.7 mEq/L (ref 3.5–5.1)
Sodium: 141 mEq/L (ref 135–145)

## 2015-12-02 LAB — TSH: TSH: 1.26 u[IU]/mL (ref 0.35–4.50)

## 2015-12-04 ENCOUNTER — Encounter: Payer: Self-pay | Admitting: Internal Medicine

## 2015-12-04 ENCOUNTER — Ambulatory Visit (INDEPENDENT_AMBULATORY_CARE_PROVIDER_SITE_OTHER): Payer: Medicare Other | Admitting: Internal Medicine

## 2015-12-04 VITALS — BP 136/80 | HR 81 | Temp 98.6°F | Resp 20 | Wt 147.0 lb

## 2015-12-04 DIAGNOSIS — J4530 Mild persistent asthma, uncomplicated: Secondary | ICD-10-CM | POA: Diagnosis not present

## 2015-12-04 DIAGNOSIS — I10 Essential (primary) hypertension: Secondary | ICD-10-CM

## 2015-12-04 DIAGNOSIS — Z0000 Encounter for general adult medical examination without abnormal findings: Secondary | ICD-10-CM | POA: Diagnosis not present

## 2015-12-04 DIAGNOSIS — R6889 Other general symptoms and signs: Secondary | ICD-10-CM

## 2015-12-04 DIAGNOSIS — Z0001 Encounter for general adult medical examination with abnormal findings: Secondary | ICD-10-CM | POA: Diagnosis not present

## 2015-12-04 DIAGNOSIS — E785 Hyperlipidemia, unspecified: Secondary | ICD-10-CM | POA: Diagnosis not present

## 2015-12-04 MED ORDER — HYDROCHLOROTHIAZIDE 25 MG PO TABS: 12.5000 mg | ORAL_TABLET | Freq: Every day | ORAL | Status: DC

## 2015-12-04 NOTE — Patient Instructions (Signed)
Please continue all other medications as before, and refills have been done if requested.  Please have the pharmacy call with any other refills you may need.  Please continue your efforts at being more active, low cholesterol diet, and weight control.  You are otherwise up to date with prevention measures today.  Please keep your appointments with your specialists as you may have planned  Please return in 1 year for your yearly visit, or sooner if needed, with Lab testing done 3-5 days before  

## 2015-12-04 NOTE — Progress Notes (Signed)
Pre visit review using our clinic review tool, if applicable. No additional management support is needed unless otherwise documented below in the visit note. 

## 2015-12-04 NOTE — Assessment & Plan Note (Signed)
stable overall by history and exam, recent data reviewed with pt, and pt to continue medical treatment as before,  to f/u any worsening symptoms or concerns  LDL - 106 - best in years, to cont lower chol diet

## 2015-12-04 NOTE — Assessment & Plan Note (Signed)
stable overall by history and exam, recent data reviewed with pt, and pt to continue medical treatment as before,  to f/u any worsening symptoms or concerns SpO2 Readings from Last 3 Encounters:  12/04/15 94%  09/14/15 96%  12/03/14 95%

## 2015-12-04 NOTE — Assessment & Plan Note (Signed)

## 2015-12-04 NOTE — Assessment & Plan Note (Addendum)
stable overall by history and exam, recent data reviewed with pt, and pt to continue medical treatment as before,  to f/u any worsening symptoms or concerns BP Readings from Last 3 Encounters:  12/04/15 136/80  09/14/15 148/88  12/03/14 134/82   In addition to the time spent performing CPE, I spent an additional 15 minutes face to face,in which greater than 50% of this time was spent in counseling and coordination of care for patient's acute illness as documented.

## 2015-12-04 NOTE — Progress Notes (Signed)
Subjective:    Patient ID: Sierra Robbins, female    DOB: 1934-11-10, 81 y.o.   MRN: [id number]  HPI  Here for wellness and f/u;  Overall doing ok;  Pt denies Chest pain, worsening SOB, DOE, wheezing, orthopnea, PND, worsening LE edema, palpitations, dizziness or syncope.  Pt denies neurological change such as new headache, facial or extremity weakness.  Pt denies polydipsia, polyuria, or low sugar symptoms. Pt states overall good compliance with treatment and medications, good tolerability, and has been trying to follow appropriate diet.  Pt denies worsening depressive symptoms, suicidal ideation or panic. No fever, night sweats, wt loss, loss of appetite, or other constitutional symptoms.  Pt states good ability with ADL's, has low fall risk, home safety reviewed and adequate, no other significant changes in hearing or vision, and only occasionally active with exercise. Now fully retired since last yr, working part time x 40 yrs at [employer].  Now finds herself busier than ever, remains active with senior meetings and activities.  Reads quite a bit. Past Medical History  Diagnosis Date  . GERD (gastroesophageal reflux disease)   . Barrett's esophagus   . Allergic rhinitis   . Asthma   . Hyperlipidemia   . Osteoporosis   . DJD (degenerative joint disease)     hand  . Hypertension   . COPD (chronic obstructive pulmonary disease) (HCC)   . History of shingles     Left upper arm/axilla  . Vitamin D deficiency    Past Surgical History  Procedure Laterality Date  . Appendectomy    . Vesicovaginal fistula closure w/ tah    . Cholecystectomy    . Nasal septum surgery    . Tracheostomy      s/p temp; at 81 yo for croup    reports that she has never smoked. She does not have any smokeless tobacco history on file. She reports that she drinks alcohol. She reports that she does not use illicit drugs. family history includes Heart attack in her mother; Rheum arthritis in her sister;  Stomach cancer in her maternal grandfather; Stroke in her father. Allergies  Allergen Reactions  . Alendronate Sodium     REACTION: leg cramps  . Atorvastatin   . Ibandronate Sodium     REACTION: gi upset, and sluggish  . Prednisone   . Ramipril     Throat, mouth, and lip swelling  . Statins     REACTION: myalgias   Current Outpatient Prescriptions on File Prior to Visit  Medication Sig Dispense Refill  . albuterol (PROVENTIL HFA) 108 (90 Base) MCG/ACT inhaler Inhale 2 puffs into the lungs every 4 (four) hours as needed for wheezing or shortness of breath. 1 Inhaler prn  . amitriptyline (ELAVIL) 25 MG tablet Take 0.5 tablets (12.5 mg total) by mouth at bedtime as needed. 45 tablet 1  . amoxicillin (AMOXIL) 500 MG tablet Take 1 tablet (500 mg total) by mouth 2 (two) times daily. 14 tablet 3  . aspirin 81 MG tablet Take 81 mg by mouth daily.    . diclofenac sodium (VOLTAREN) 1 % GEL Apply 4 g topically 4 (four) times daily. 100 g 5  . hydrochlorothiazide (HYDRODIURIL) 25 MG tablet Take 0.5 tablets (12.5 mg total) by mouth daily. 45 tablet 3  . Multiple Vitamin (MULTIVITAMIN) tablet Take 1 tablet by mouth daily.    . Niacin CR 750 MG TBCR Take 1 tablet (750 mg total) by mouth daily. 90 each 3  .  NONFORMULARY OR COMPOUNDED ITEM Allergy Vaccine 1:10 Given at Home    . Omega-3 Fatty Acids (FISH OIL) 1200 MG CAPS Take 1 capsule by mouth 2 (two) times a week.     Marland Kitchen. omeprazole (PRILOSEC) 20 MG capsule Take 20 mg by mouth daily as needed.    . sodium chloride (OCEAN) 0.65 % SOLN nasal spray Place 1 spray into both nostrils as needed for congestion.    . vitamin E 400 UNIT capsule Take 400 Units by mouth daily.     No current facility-administered medications on file prior to visit.   Review of Systems Constitutional: Negative for increased diaphoresis, or other activity, appetite or siginficant weight change other than noted HENT: Negative for worsening hearing loss, ear pain, facial  swelling, mouth sores and neck stiffness.   Eyes: Negative for other worsening pain, redness or visual disturbance.  Respiratory: Negative for choking or stridor Cardiovascular: Negative for other chest pain and palpitations.  Gastrointestinal: Negative for worsening diarrhea, blood in stool, or abdominal distention Genitourinary: Negative for hematuria, flank pain or change in urine volume.  Musculoskeletal: Negative for myalgias or other joint complaints.  Skin: Negative for other color change and wound or drainage.  Neurological: Negative for syncope and numbness. other than noted Hematological: Negative for adenopathy. or other swelling Psychiatric/Behavioral: Negative for hallucinations, SI, self-injury, decreased concentration or other worsening agitation.      Objective:   Physical Exam BP 136/80 mmHg  Pulse 81  Temp(Src) 98.6 F (37 C) (Oral)  Resp 20  Wt 147 lb (66.679 kg)  SpO2 94%  VS noted,  Constitutional: Pt is oriented to person, place, and time. Appears well-developed and well-nourished, in no significant distress Head: Normocephalic and atraumatic  Eyes: Conjunctivae and EOM are normal. Pupils are equal, round, and reactive to light Right Ear: External ear normal.  Left Ear: External ear normal Nose: Nose normal.  Mouth/Throat: Oropharynx is clear and moist  Neck: Normal range of motion. Neck supple. No JVD present. No tracheal deviation present or significant neck LA or mass Cardiovascular: Normal rate, regular rhythm, normal heart sounds and intact distal pulses.   Pulmonary/Chest: Effort normal and breath sounds without rales or wheezing  Abdominal: Soft. Bowel sounds are normal. NT. No HSM  Musculoskeletal: Normal range of motion. Exhibits no edema Lymphadenopathy: Has no cervical adenopathy.  Neurological: Pt is alert and oriented to person, place, and time. Pt has normal reflexes. No cranial nerve deficit. Motor grossly intact Skin: Skin is warm and dry.  No rash noted or new ulcers Psychiatric:  Has normal mood and affect. Behavior is normal.   ecg - NSR, LAA, o/w no acute changes    Assessment & Plan:

## 2015-12-14 ENCOUNTER — Telehealth: Payer: Self-pay

## 2015-12-14 NOTE — Telephone Encounter (Signed)
Sierra Robbins called back to state she is doing well; eating very healthy; has shakes in the am. Chol is good; Dr. Jonny RuizJohn asked about depression and falls which is neg; no memory issues. States she declines the zostavax; Will come in for AWV when seeing Dr. Jonny RuizJohn next year.

## 2015-12-14 NOTE — Telephone Encounter (Signed)
Call to schedule AWV; LVM for call back to schedule at her convenience

## 2016-05-06 ENCOUNTER — Telehealth: Payer: Self-pay | Admitting: Internal Medicine

## 2016-05-06 DIAGNOSIS — J309 Allergic rhinitis, unspecified: Secondary | ICD-10-CM | POA: Diagnosis not present

## 2016-05-06 NOTE — Telephone Encounter (Signed)
Allergy Serum Extract Date Mixed: 05/06/2016 Vial: A Strength: 1:10 Here/Mail/Pick Up: Mail Mixed By: Jaynee EaglesLindsay Lemons, CMA Last OV: 09/14/2015 Pending OV: 09/13/2016

## 2016-05-12 ENCOUNTER — Ambulatory Visit (INDEPENDENT_AMBULATORY_CARE_PROVIDER_SITE_OTHER): Payer: Medicare Other

## 2016-05-12 DIAGNOSIS — Z23 Encounter for immunization: Secondary | ICD-10-CM

## 2016-09-13 ENCOUNTER — Ambulatory Visit (INDEPENDENT_AMBULATORY_CARE_PROVIDER_SITE_OTHER)
Admission: RE | Admit: 2016-09-13 | Discharge: 2016-09-13 | Disposition: A | Discharge status: Home / Self Care | Payer: Medicare Other | Source: Ambulatory Visit | Attending: Internal Medicine | Admitting: Internal Medicine

## 2016-09-13 ENCOUNTER — Other Ambulatory Visit (INDEPENDENT_AMBULATORY_CARE_PROVIDER_SITE_OTHER): Payer: Medicare Other

## 2016-09-13 ENCOUNTER — Encounter: Payer: Self-pay | Admitting: Internal Medicine

## 2016-09-13 ENCOUNTER — Ambulatory Visit (INDEPENDENT_AMBULATORY_CARE_PROVIDER_SITE_OTHER): Payer: Medicare Other | Admitting: Internal Medicine

## 2016-09-13 VITALS — BP 160/81 | HR 70 | Ht 60.0 in | Wt 146.6 lb

## 2016-09-13 DIAGNOSIS — J3089 Other allergic rhinitis: Secondary | ICD-10-CM | POA: Diagnosis not present

## 2016-09-13 DIAGNOSIS — J4520 Mild intermittent asthma, uncomplicated: Secondary | ICD-10-CM

## 2016-09-13 DIAGNOSIS — J4530 Mild persistent asthma, uncomplicated: Secondary | ICD-10-CM

## 2016-09-13 DIAGNOSIS — J302 Other seasonal allergic rhinitis: Secondary | ICD-10-CM

## 2016-09-13 DIAGNOSIS — K219 Gastro-esophageal reflux disease without esophagitis: Secondary | ICD-10-CM | POA: Diagnosis not present

## 2016-09-13 LAB — CBC WITH DIFFERENTIAL/PLATELET
BASOS ABS: 0 10*3/uL (ref 0.0–0.1)
BASOS PCT: 0.8 % (ref 0.0–3.0)
Eosinophils Absolute: 0.5 10*3/uL (ref 0.0–0.7)
Eosinophils Relative: 8 % — ABNORMAL HIGH (ref 0.0–5.0)
HEMATOCRIT: 40.2 % (ref 36.0–46.0)
Hemoglobin: 14 g/dL (ref 12.0–15.0)
LYMPHS PCT: 36.5 % (ref 12.0–46.0)
Lymphs Abs: 2.2 10*3/uL (ref 0.7–4.0)
MCHC: 34.7 g/dL (ref 30.0–36.0)
MCV: 100.1 fl — ABNORMAL HIGH (ref 78.0–100.0)
MONO ABS: 0.6 10*3/uL (ref 0.1–1.0)
Monocytes Relative: 9.4 % (ref 3.0–12.0)
NEUTROS ABS: 2.7 10*3/uL (ref 1.4–7.7)
Neutrophils Relative %: 45.3 % (ref 43.0–77.0)
PLATELETS: 256 10*3/uL (ref 150.0–400.0)
RBC: 4.02 Mil/uL (ref 3.87–5.11)
RDW: 12.7 % (ref 11.5–15.5)
WBC: 6 10*3/uL (ref 4.0–10.5)

## 2016-09-13 MED ORDER — AMOXICILLIN 500 MG PO TABS: 500.0000 mg | ORAL_TABLET | Freq: Two times a day (BID) | ORAL | 3 refills | Status: DC

## 2016-09-13 MED ORDER — FLUTICASONE PROPIONATE 50 MCG/ACT NA SUSP: NASAL | 12 refills | Status: DC

## 2016-09-13 MED ORDER — ALBUTEROL SULFATE HFA 108 (90 BASE) MCG/ACT IN AERS: 2.0000 | INHALATION_SPRAY | RESPIRATORY_TRACT | 99 refills | Status: DC | PRN

## 2016-09-13 NOTE — Patient Instructions (Signed)
Order- CXR   Dx asthma mild, intermittent  Order- office spirometry      Order- lab- Allergy profile, CBC w diff      Dx seasonal and perennial allergic rhinitis  Scripts printed for ventolin inhaler and for amoxacillin as before   Suggest you try otc Flonase/ fluticasone nasal spray    1-2 puffs each nostril every night at bedtime while needed

## 2016-09-13 NOTE — Assessment & Plan Note (Signed)
I question of vasomotor rhinitis now at her age and suggest she stay off of allergy vaccine for observation. Encourage her to avoid overdrying and to continue saline rinse.

## 2016-09-13 NOTE — Assessment & Plan Note (Signed)
Office spirometry indicates possible fixed obstructive airways disease in this nonsmoker. Plan-CXR, allergy profile. Okay to renew current meds but consider trial of LABA/LAMA if she is symptomatic. Meds are refilled.

## 2016-09-13 NOTE — Progress Notes (Signed)
HPI female never smoker followed for allergic rhinitis, asthma, complicated by GERD/Barrett's, HBP Office Spirometry 09/13/2016-moderate airway obstruction. FVC 1.71/79%, FEV1 1.08/67%, ratio 0.63, FEF 25-75 percent 0.49/40    09/14/2015-82 year old female never smoker followed for allergic rhinitis, asthma, complicated by GERD/Barrett's, HBP Allergy vaccine 1:10 GO Follows for: SAR/PAR. Pt states that her allergy symptoms are doing well. Pt states that she has used the amoxicillin prescription maybe twice this past year. Pt does report getting a "heavy" feeling in her chest when the weather is humid. She has been using Primatene tablets for this and this has been helping. Pt is on allergy injections and reports no reactions.  Only occasional wheeze. Rarely takes one half of a Primatene tablet for chest tightness with good relief.  09/13/2016-81 year old female never smoker followed for allergic rhinitis, asthma, complicated by GERD/Barrett's, HBP Allergy vaccine 1:10 G0 ended with the closing of our allergy program at this office 08/28/2016 FOLLOWS FOR: yearly visit,Sunday morning chest was tight took 1/2 a primatine 7:00am and took the other 1/2 12:30p.m. it went away. Headache over rgt. eye,sinus pressure. Feels she is doing "really well". Over the weekend used her Ventolin rescue inhaler and took Primatene on one day when she woke feeling tight. Blames cold weather. Usually does not need any rescue medication. Amoxicillin used twice this year for bronchitis. Occasional wheeze. Recent sinus congestion for which she has been using a saline rinse to clear white mucus. Frequently aware of reflux without bad heartburn or nocturnal choking. Continues PPI. Office Spirometry 09/13/2016-moderate airway obstruction. FVC 1.71/79%, FEV1 1.08/67%, ratio 0.63, FEF 25-75 percent 0.49/40%.  ROS-see HPI Constitutional:   No-   weight loss, night sweats, fevers, chills, fatigue, lassitude. HEENT:   +  headaches, no- difficulty swallowing, tooth/dental problems, sore throat,       +sneezing, no-itching, ear ache, +nasal congestion, + post nasal drip,  CV:  No-   chest pain, orthopnea, PND, swelling in lower extremities, anasarca, dizziness, palpitations Resp: No-   shortness of breath with exertion or at rest.              No-   productive cough,  No non-productive cough,  No- coughing up of blood.              No-   change in color of mucus.  + wheezing.   Skin: No-   rash or lesions. GI:  No-   heartburn, indigestion, abdominal pain, nausea, vomiting,  GU: MS:  No-   joint pain or swelling.   Neuro-     nothing unusual Psych:  No- change in mood or affect. No depression or anxiety.  No memory loss.  OBJ General- Alert, Oriented, Affect-appropriate, Distress- none acute Skin- rash-none, lesions- none, excoriation- none Lymphadenopathy- none Head- atraumatic            Eyes- Gross vision intact, PERRLA, conjunctivae clear secretions            Ears- Hearing, canals-normal            Nose- , no-Septal dev, mucus, polyps, erosion, perforation , + dry stuffy            Throat- Mallampati III , mucosa clear , drainage- none, tonsils- atrophic.                     + Dentures Neck- flexible , old sternal scar ( surgical complication of ?thyroid surgery?) , no stridor , thyroid nl, carotid no bruit Chest - symmetrical excursion ,  unlabored           Heart/CV- RRR , no murmur , no gallop  , no rub, nl s1 s2                           - JVD- none , edema- none, stasis changes- none, varices- none           Lung- clear to P&A, wheeze- none, cough- none , dullness-none, rub- none           Chest wall- sternotomy scar Abd- Br/ Gen/ Rectal- Not done, not indicated Extrem- cyanosis- none, clubbing, none, atrophy- none, strength- nl Neuro- grossly intact to observation

## 2016-09-13 NOTE — Assessment & Plan Note (Signed)
She is aware of reflux but denies heartburn or obvious aspiration. We reviewed reflux precautions and she will continue to monitor this with her primary physician.

## 2016-09-14 LAB — RESPIRATORY ALLERGY PROFILE REGION II ~~LOC~~
Allergen, C. Herbarum, M2: <0.1 kU/L
Allergen, Cedar tree, t12: <0.1 kU/L
Allergen, Comm Silver Birch, t9: <0.1 kU/L
Allergen, Cottonwood, t14: <0.1 kU/L
Allergen, D pternoyssinus,d7: 2.59 kU/L — ABNORMAL HIGH
Allergen, Mouse Urine Protein, e78: <0.1 kU/L
Allergen, Mulberry, t76: <0.1 kU/L
Bermuda Grass: <0.1 kU/L
Cat Dander: 0.19 kU/L — ABNORMAL HIGH
D. FARINAE: 3.96 kU/L — AB
Dog Dander: <0.1 kU/L
Elm IgE: <0.1 kU/L
IgE (Immunoglobulin E), Serum: 71 kU/L (ref <115)
Sheep Sorrel IgE: <0.1 kU/L

## 2016-12-01 ENCOUNTER — Other Ambulatory Visit (INDEPENDENT_AMBULATORY_CARE_PROVIDER_SITE_OTHER): Payer: Medicare Other

## 2016-12-01 DIAGNOSIS — Z0000 Encounter for general adult medical examination without abnormal findings: Secondary | ICD-10-CM

## 2016-12-01 LAB — URINALYSIS, ROUTINE W REFLEX MICROSCOPIC
Bilirubin Urine: NEGATIVE
Ketones, ur: NEGATIVE
Leukocytes, UA: NEGATIVE
Nitrite: NEGATIVE
PH: 7 (ref 5.0–8.0)
SPECIFIC GRAVITY, URINE: 1.015 (ref 1.000–1.030)
Total Protein, Urine: NEGATIVE
Urine Glucose: NEGATIVE
Urobilinogen, UA: 0.2 (ref 0.0–1.0)

## 2016-12-01 LAB — CBC WITH DIFFERENTIAL/PLATELET
Basophils Absolute: 0.1 10*3/uL (ref 0.0–0.1)
Basophils Relative: 1.8 % (ref 0.0–3.0)
EOS PCT: 9.7 % — AB (ref 0.0–5.0)
Eosinophils Absolute: 0.5 10*3/uL (ref 0.0–0.7)
HCT: 38.9 % (ref 36.0–46.0)
Hemoglobin: 13.5 g/dL (ref 12.0–15.0)
LYMPHS ABS: 1.8 10*3/uL (ref 0.7–4.0)
Lymphocytes Relative: 37 % (ref 12.0–46.0)
MCHC: 34.8 g/dL (ref 30.0–36.0)
MCV: 100.9 fl — AB (ref 78.0–100.0)
MONOS PCT: 10 % (ref 3.0–12.0)
Monocytes Absolute: 0.5 10*3/uL (ref 0.1–1.0)
NEUTROS ABS: 2 10*3/uL (ref 1.4–7.7)
NEUTROS PCT: 41.5 % — AB (ref 43.0–77.0)
PLATELETS: 236 10*3/uL (ref 150.0–400.0)
RBC: 3.85 Mil/uL — ABNORMAL LOW (ref 3.87–5.11)
RDW: 12.7 % (ref 11.5–15.5)
WBC: 4.8 10*3/uL (ref 4.0–10.5)

## 2016-12-01 LAB — HEPATIC FUNCTION PANEL
ALK PHOS: 62 U/L (ref 39–117)
ALT: 13 U/L (ref 0–35)
AST: 20 U/L (ref 0–37)
Albumin: 4 g/dL (ref 3.5–5.2)
BILIRUBIN DIRECT: 0.1 mg/dL (ref 0.0–0.3)
Total Bilirubin: 0.8 mg/dL (ref 0.2–1.2)
Total Protein: 7 g/dL (ref 6.0–8.3)

## 2016-12-01 LAB — LIPID PANEL
CHOL/HDL RATIO: 4
Cholesterol: 186 mg/dL (ref 0–200)
HDL: 43.6 mg/dL (ref >39.00)
LDL CALC: 103 mg/dL — AB (ref 0–99)
NONHDL: 142.62
Triglycerides: 196 mg/dL — ABNORMAL HIGH (ref 0.0–149.0)
VLDL: 39.2 mg/dL (ref 0.0–40.0)

## 2016-12-01 LAB — BASIC METABOLIC PANEL
BUN: 11 mg/dL (ref 6–23)
CALCIUM: 9.4 mg/dL (ref 8.4–10.5)
CO2: 32 mEq/L (ref 19–32)
Chloride: 104 mEq/L (ref 96–112)
Creatinine, Ser: 0.63 mg/dL (ref 0.40–1.20)
GFR: 96.77 mL/min (ref >60.00)
Glucose, Bld: 100 mg/dL — ABNORMAL HIGH (ref 70–99)
Potassium: 4.4 mEq/L (ref 3.5–5.1)
SODIUM: 142 meq/L (ref 135–145)

## 2016-12-01 LAB — TSH: TSH: 3.32 u[IU]/mL (ref 0.35–4.50)

## 2016-12-07 ENCOUNTER — Ambulatory Visit (INDEPENDENT_AMBULATORY_CARE_PROVIDER_SITE_OTHER): Payer: Medicare Other | Admitting: Internal Medicine

## 2016-12-07 ENCOUNTER — Encounter: Payer: Self-pay | Admitting: Internal Medicine

## 2016-12-07 VITALS — BP 160/100 | HR 69 | Temp 98.1°F | Ht 60.0 in | Wt 146.0 lb

## 2016-12-07 DIAGNOSIS — M810 Age-related osteoporosis without current pathological fracture: Secondary | ICD-10-CM

## 2016-12-07 DIAGNOSIS — Z0000 Encounter for general adult medical examination without abnormal findings: Secondary | ICD-10-CM | POA: Diagnosis not present

## 2016-12-07 DIAGNOSIS — E2839 Other primary ovarian failure: Secondary | ICD-10-CM | POA: Diagnosis not present

## 2016-12-07 MED ORDER — HYDROCHLOROTHIAZIDE 25 MG PO TABS: 25.0000 mg | ORAL_TABLET | Freq: Every day | ORAL | 3 refills | Status: DC

## 2016-12-07 NOTE — Assessment & Plan Note (Signed)

## 2016-12-07 NOTE — Assessment & Plan Note (Signed)
For f/u dxa 

## 2016-12-07 NOTE — Progress Notes (Signed)
Pre visit review using our clinic review tool, if applicable. No additional management support is needed unless otherwise documented below in the visit note. 

## 2016-12-07 NOTE — Patient Instructions (Addendum)
Please schedule the bone density test before leaving today at the scheduling desk (where you check out)  Your EKG was [facility] today  Ok to increase the fluid pill to whole pill (HCt 25 mg per day)  Please continue all other medications as before, and refills have been done if requested.  Please have the pharmacy call with any other refills you may need.  Please continue your efforts at being more active, low cholesterol diet, and weight control.  You are otherwise up to date with prevention measures today.  Please keep your appointments with your specialists as you may have planned  Please return in 1 year for your yearly visit, or sooner if needed, with Lab testing done 3-5 days before

## 2016-12-07 NOTE — Progress Notes (Signed)
Subjective:    Patient ID: Sierra Robbins, female    DOB: 29-Nov-1934, 82 y.o.   MRN: [id number]  HPI  Here for wellness and f/u;  Overall doing ok;  Pt denies Chest pain, worsening SOB, DOE, wheezing, orthopnea, PND, worsening LE edema, palpitations, dizziness or syncope.  Pt denies neurological change such as new headache, facial or extremity weakness.  Pt denies polydipsia, polyuria, or low sugar symptoms. Pt states overall good compliance with treatment and medications, good tolerability, and has been trying to follow appropriate diet.  Pt denies worsening depressive symptoms, suicidal ideation or panic. No fever, night sweats, wt loss, loss of appetite, or other constitutional symptoms.  Pt states good ability with ADL's, has low fall risk, home safety reviewed and adequate, no other significant changes in hearing or vision, and occasionally active with exercise, has always been high energy.  Does see herself slowing a bit and takes naps occas when sits too long, but stills mows yard, and helps with 2 disabled person. No other complaints, no other history.  Wt Readings from Last 3 Encounters:  12/07/16 146 lb (66.2 kg)  09/13/16 146 lb 9.6 oz (66.5 kg)  12/04/15 147 lb (66.7 kg)   BP Readings from Last 3 Encounters:  12/07/16 (!) 190/78  09/13/16 (!) 160/81  12/04/15 136/80  Goes to [facility] with free cuff, most often gets about 147/73.  Admits to higher salt diet recently.  Does not want arb or other, but will accept incresaed hct 25. Past Medical History:  Diagnosis Date  . Allergic rhinitis   . Asthma   . Barrett's esophagus   . COPD (chronic obstructive pulmonary disease) (HCC)   . DJD (degenerative joint disease)    hand  . GERD (gastroesophageal reflux disease)   . History of shingles    Left upper arm/axilla  . Hyperlipidemia   . Hypertension   . Osteoporosis   . Vitamin D deficiency    Past Surgical History:  Procedure Laterality Date  . APPENDECTOMY    . CHOLECYSTECTOMY     . NASAL SEPTUM SURGERY    . TRACHEOSTOMY     s/p temp; at 82 yo for croup  . VESICOVAGINAL FISTULA CLOSURE W/ TAH      reports that she has never smoked. She has never used smokeless tobacco. She reports that she drinks alcohol. She reports that she does not use drugs. family history includes Heart attack in her mother; Rheum arthritis in her sister; Stomach cancer in her maternal grandfather; Stroke in her father. Allergies  Allergen Reactions  . Alendronate Sodium     REACTION: leg cramps  . Atorvastatin   . Ibandronate Sodium     REACTION: gi upset, and sluggish  . Prednisone   . Ramipril     Throat, mouth, and lip swelling  . Statins     REACTION: myalgias   Current Outpatient Prescriptions on File Prior to Visit  Medication Sig Dispense Refill  . albuterol (PROVENTIL HFA) 108 (90 Base) MCG/ACT inhaler Inhale 2 puffs into the lungs every 4 (four) hours as needed for wheezing or shortness of breath. 1 Inhaler prn  . amitriptyline (ELAVIL) 25 MG tablet Take 0.5 tablets (12.5 mg total) by mouth at bedtime as needed. 45 tablet 1  . aspirin 81 MG tablet Take 81 mg by mouth daily.    . fluticasone (FLONASE) 50 MCG/ACT nasal spray 1-2 puffs each nostril once daily at bedtime 16 g 12  . loratadine (CLARITIN) 10 MG  tablet Take 10 mg by mouth daily.    . Multiple Vitamin (MULTIVITAMIN) tablet Take 1 tablet by mouth daily.    . Niacin CR 750 MG TBCR Take 1 tablet (750 mg total) by mouth daily. 90 each 3  . Omega-3 Fatty Acids (FISH OIL) 1200 MG CAPS Take 1 capsule by mouth 2 (two) times a week.     Marland Kitchen omeprazole (PRILOSEC) 20 MG capsule Take 20 mg by mouth daily as needed.    . pseudoephedrine-acetaminophen (TYLENOL SINUS) 30-500 MG TABS tablet Take 1 tablet by mouth every 4 (four) hours as needed.    . sodium chloride (OCEAN) 0.65 % SOLN nasal spray Place 1 spray into both nostrils as needed for congestion.    . vitamin E 400 UNIT capsule Take 400 Units by mouth daily.     No current  facility-administered medications on file prior to visit.    Review of Systems Constitutional: Negative for other unusual diaphoresis, sweats, appetite or weight changes HENT: Negative for other worsening hearing loss, ear pain, facial swelling, mouth sores or neck stiffness.   Eyes: Negative for other worsening pain, redness or other visual disturbance.  Respiratory: Negative for other stridor or swelling Cardiovascular: Negative for other palpitations or other chest pain  Gastrointestinal: Negative for worsening diarrhea or loose stools, blood in stool, distention or other pain Genitourinary: Negative for hematuria, flank pain or other change in urine volume.  Musculoskeletal: Negative for myalgias or other joint swelling.  Skin: Negative for other color change, or other wound or worsening drainage.  Neurological: Negative for other syncope or numbness. Hematological: Negative for other adenopathy or swelling Psychiatric/Behavioral: Negative for hallucinations, other worsening agitation, SI, self-injury, or new decreased concentration All other system neg per pt    Objective:   Physical Exam BP (!) 190/78   Pulse 69   Temp 98.1 F (36.7 C) (Oral)   Ht 5' (1.524 m)   Wt 146 lb (66.2 kg)   SpO2 98%   BMI 28.51 kg/m  VS noted,  Constitutional: Pt is oriented to person, place, and time. Appears well-developed and well-nourished, in no significant distress and comfortable Head: Normocephalic and atraumatic  Eyes: Conjunctivae and EOM are normal. Pupils are equal, round, and reactive to light Right Ear: External ear normal without discharge Left Ear: External ear normal without discharge Nose: Nose without discharge or deformity Mouth/Throat: Oropharynx is without other ulcerations and moist  Neck: Normal range of motion. Neck supple. No JVD present. No tracheal deviation present or significant neck LA or mass Cardiovascular: Normal rate, regular rhythm, normal heart sounds and  intact distal pulses.   Pulmonary/Chest: WOB normal and breath sounds without rales or wheezing  Abdominal: Soft. Bowel sounds are normal. NT. No HSM  Musculoskeletal: Normal range of motion. Exhibits no edema Lymphadenopathy: Has no other cervical adenopathy.  Neurological: Pt is alert and oriented to person, place, and time. Pt has normal reflexes. No cranial nerve deficit. Motor grossly intact, Gait intact Skin: Skin is warm and dry. No rash noted or new ulcerations Psychiatric:  Has normal mood and affect. Behavior is normal without agitation No other exam findings  ECG today I have personally interpreted Sinus  Rhythm   Lab Results  Component Value Date   WBC 4.8 12/01/2016   HGB 13.5 12/01/2016   HCT 38.9 12/01/2016   PLT 236.0 12/01/2016   GLUCOSE 100 (H) 12/01/2016   CHOL 186 12/01/2016   TRIG 196.0 (H) 12/01/2016   HDL 43.60 12/01/2016  LDLDIRECT 106.0 12/02/2015   LDLCALC 103 (H) 12/01/2016   ALT 13 12/01/2016   AST 20 12/01/2016   NA 142 12/01/2016   K 4.4 12/01/2016   CL 104 12/01/2016   CREATININE 0.63 12/01/2016   BUN 11 12/01/2016   CO2 32 12/01/2016   TSH 3.32 12/01/2016       Assessment  Plan:

## 2017-05-25 ENCOUNTER — Ambulatory Visit (INDEPENDENT_AMBULATORY_CARE_PROVIDER_SITE_OTHER): Payer: Medicare Other

## 2017-05-25 DIAGNOSIS — Z23 Encounter for immunization: Secondary | ICD-10-CM | POA: Diagnosis not present

## 2017-09-13 ENCOUNTER — Ambulatory Visit: Payer: Medicare Other | Admitting: Internal Medicine

## 2017-09-13 ENCOUNTER — Other Ambulatory Visit: Payer: Self-pay | Admitting: Internal Medicine

## 2017-09-13 ENCOUNTER — Encounter: Payer: Self-pay | Admitting: Internal Medicine

## 2017-09-13 DIAGNOSIS — J3089 Other allergic rhinitis: Secondary | ICD-10-CM

## 2017-09-13 DIAGNOSIS — J4530 Mild persistent asthma, uncomplicated: Secondary | ICD-10-CM | POA: Diagnosis not present

## 2017-09-13 DIAGNOSIS — J302 Other seasonal allergic rhinitis: Secondary | ICD-10-CM | POA: Diagnosis not present

## 2017-09-13 MED ORDER — TRIAMCINOLONE ACETONIDE 55 MCG/ACT NA AERO: 2.0000 | INHALATION_SPRAY | Freq: Every day | NASAL | 12 refills | Status: DC

## 2017-09-13 MED ORDER — AMOXICILLIN 500 MG PO TABS: 500.0000 mg | ORAL_TABLET | Freq: Two times a day (BID) | ORAL | 2 refills | Status: DC

## 2017-09-13 NOTE — Progress Notes (Signed)
HPI female never smoker followed for allergic rhinitis, asthma, complicated by GERD/Barrett's, HBP Office Spirometry 09/13/2016-moderate airway obstruction. FVC 1.71/79%, FEV1 1.08/67%, ratio 0.63, FEF 25-75 percent 0.49/40  --------------------------------------------------------------------------------   09/13/2016-83 year old female never smoker followed for allergic rhinitis, asthma, complicated by GERD/Barrett's, HBP Allergy vaccine 1:10 G0 ended with the closing of our allergy program at this office 08/28/2016 FOLLOWS FOR: yearly visit,Sunday morning chest was tight took 1/2 a primatine 7:00am and took the other 1/2 12:30p.m. it went away. Headache over rgt. eye,sinus pressure. Feels she is doing "really well". Over the weekend used her Ventolin rescue inhaler and took Primatene on one day when she woke feeling tight. Blames cold weather. Usually does not need any rescue medication. Amoxicillin used twice this year for bronchitis. Occasional wheeze. Recent sinus congestion for which she has been using a saline rinse to clear white mucus. Frequently aware of reflux without bad heartburn or nocturnal choking. Continues PPI. Office Spirometry 09/13/2016-moderate airway obstruction. FVC 1.71/79%, FEV1 1.08/67%, ratio 0.63, FEF 25-75 percent 0.49/40%.  09/13/17- 83 year old female never smoker followed for allergic rhinitis, asthma, complicated by GERD/Barrett's, HBP ---FOLLOW FOR: Asthma >> Breathing is unchanged since last OV. Reports SOB at times and increased sneezing. Stopped allergy vaccine a year ago as planned and only sneezes occasionally.  Says she is doing "very well".  Asks for an antibiotic to hold again this year.  Rarely needed rescue inhaler.  Flonase gives headache.  ROS-see HPI  + = positive Constitutional:   No-   weight loss, night sweats, fevers, chills, fatigue, lassitude. HEENT:   + headaches, no- difficulty swallowing, tooth/dental problems, sore throat,   +sneezing, no-itching, ear ache, +nasal congestion, + post nasal drip,  CV:  No-   chest pain, orthopnea, PND, swelling in lower extremities, anasarca, dizziness, palpitations Resp: No-   shortness of breath with exertion or at rest.              No-   productive cough,  No non-productive cough,  No- coughing up of blood.              No-   change in color of mucus.  + wheezing.   Skin: No-   rash or lesions. GI:  No-   heartburn, indigestion, abdominal pain, nausea, vomiting,  GU: MS:  No-   joint pain or swelling.   Neuro-     nothing unusual Psych:  No- change in mood or affect. No depression or anxiety.  No memory loss.  OBJ General- Alert, Oriented, Affect-appropriate, Distress- none acute Skin- rash-none, lesions- none, excoriation- none Lymphadenopathy- none Head- atraumatic            Eyes- Gross vision intact, PERRLA, conjunctivae clear secretions            Ears- Hearing, canals-normal            Nose- , no-Septal dev, mucus, polyps, erosion, perforation , + dry stuffy            Throat- Mallampati III , mucosa clear , drainage- none, tonsils- atrophic.                     + Dentures Neck- flexible , old sternal scar ( surgical complication of ?thyroid surgery?) , no stridor , thyroid nl, carotid no bruit Chest - symmetrical excursion , unlabored           Heart/CV- RRR , no murmur , no gallop  , no rub, nl s1 s2                           -  JVD- none , edema- none, stasis changes- none, varices- none           Lung- clear to P&A, wheeze- none, cough- none , dullness-none, rub- none           Chest wall- sternotomy scar Abd- Br/ Gen/ Rectal- Not done, not indicated Extrem- cyanosis- none, clubbing, none, atrophy- none, strength- nl Neuro- grossly intact to observation

## 2017-09-13 NOTE — Patient Instructions (Signed)
We changed Flonase to Nasacort steroid nasal spray, to use if needed for allergic nose problems. Hope this is tolerated better than Flonase.  Ok to use an otc antihistamine like Claritin/ loratadine if needed  Please call if we can help

## 2017-10-23 ENCOUNTER — Telehealth: Payer: Self-pay | Admitting: Internal Medicine

## 2017-10-23 DIAGNOSIS — J4530 Mild persistent asthma, uncomplicated: Secondary | ICD-10-CM

## 2017-10-23 NOTE — Telephone Encounter (Signed)
Called pt who believes she is currently having an asthma attack.  Pt states it has been a little over a year since she has had her allergy shots once our allergy lab closed.  Pt is wanting to begin her allergy injections again and is wanting a referral from Dr. Maple HudsonYoung to the place he believes would be best for her to go to so she can be able to begin her injections again due to it helping with her asthma symptoms.  Dr. Maple HudsonYoung, please advise on this for pt. Thanks!

## 2017-10-23 NOTE — Telephone Encounter (Signed)
Suggest referral to [facility], on [address] across from [facility] (Dr Lucie LeatherKozlow and Dr Beaulah DinningBardelas and any of the doctors in that group)

## 2017-10-23 NOTE — Telephone Encounter (Signed)
Called pt letting her know we were placing a referral for her to be referred to allergy and asthma.  Pt expressed understanding. Order placed. Nothing further needed at this current time.

## 2017-11-07 ENCOUNTER — Encounter: Payer: Self-pay | Admitting: Allergy and Immunology

## 2017-11-07 ENCOUNTER — Ambulatory Visit: Payer: Medicare Other | Admitting: Allergy and Immunology

## 2017-11-07 VITALS — BP 170/98 | HR 76 | Temp 98.3°F | Resp 14 | Ht 58.86 in | Wt 147.6 lb

## 2017-11-07 DIAGNOSIS — J4530 Mild persistent asthma, uncomplicated: Secondary | ICD-10-CM

## 2017-11-07 DIAGNOSIS — J3089 Other allergic rhinitis: Secondary | ICD-10-CM | POA: Diagnosis not present

## 2017-11-07 DIAGNOSIS — K219 Gastro-esophageal reflux disease without esophagitis: Secondary | ICD-10-CM

## 2017-11-07 MED ORDER — METHYLPREDNISOLONE ACETATE 80 MG/ML IJ SUSP
80.0000 mg | Freq: Once | INTRAMUSCULAR | Status: AC
  Administered 2017-11-07: 80 mg via INTRAMUSCULAR

## 2017-11-07 MED ORDER — FLUTICASONE FUROATE 200 MCG/ACT IN AEPB: 1.0000 | INHALATION_SPRAY | Freq: Every day | RESPIRATORY_TRACT | 5 refills | Status: DC

## 2017-11-07 NOTE — Patient Instructions (Addendum)
  1.  Allergen avoidance measures  2.  Treat and prevent inflammation:   A.  Depo-Medrol 80 IM delivered in clinic today  B.  Arnuity 200 - 1 inhalation 1 time per day  C.  OTC Nasacort/Rhinocort/Flonase -1 spray each nostril 1 time per day  3.  If needed:   A.  Pro Air HFA 2 puffs every 4-6 hours  B.  OTC antihistamine -Claritin/Zyrtec/Allegra  C.  Ranitidine 150-300 mg daily  4.  Further evaluation and treatment?  Immunotherapy?  5.  Return to clinic in 4 weeks or earlier if problem

## 2017-11-07 NOTE — Progress Notes (Signed)
Dear Sierra Robbins,  Thank you for referring Sierra Robbins to the [facility] on 11/07/2017.   Below is a summation of this patient's evaluation and recommendations.  Thank you for your referral. I will keep you informed about this patient's response to treatment.   If you have any questions please do not hesitate to contact me.   Sincerely,  Sierra Priest, MD Allergy / Immunology Sierra Robbins [facility]   ______________________________________________________________________    NEW PATIENT NOTE  Referring Provider: Waymon Budge, MD Primary Provider: Corwin Levins, MD Date of office visit: 11/07/2017    Subjective:   Chief Complaint:  Sierra Robbins (DOB: [birth year]) is a 83 y.o. female who presents to the clinic on 11/07/2017 with a chief complaint of Asthma .     HPI: Sierra Robbins presents to this clinic in evaluation of long-standing airway problems followed by Dr. Fannie Robbins for a prolonged period in time.  Sierra Robbins has a history of asthma and allergic rhinoconjunctivitis dating back many years for which she utilized immunotherapy discontinuing this form of treatment in April 2017.  Since that point in time she has really done quite well with her asthma and rarely uses a short acting bronchodilator and can walk and exercise without any difficulty and relies on the use of a nasal steroid to control her upper airway symptoms quite successfully.  At no point in time has she required a systemic steroid or antibiotic in the past year to treat a respiratory tract issue.  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  She has been using her short acting bronchodilator daily which does help transiently.  [name] also has a history of reflux that is under very good control with intermittent use of Rolaids or Alka-Seltzer 1 or 2 times per week.  Her reflux is manifested as belching and regurgitation.  She does not consume any caffeine or significant chocolate consumption and she does not drink alcohol.  Past Medical History:  Diagnosis Date  . Allergic rhinitis   . Asthma   . Barrett's esophagus   . COPD (chronic obstructive pulmonary disease) (HCC)   . DJD (degenerative joint disease)    hand  . Endometriosis   . GERD (gastroesophageal reflux disease)   . History of shingles    Left upper arm/axilla  . Hyperlipidemia   . Hypertension   . Osteoporosis   . Vitamin D deficiency     Past Surgical History:  Procedure Laterality Date  . ABDOMINAL HYSTERECTOMY  1979  . APPENDECTOMY    . CHOLECYSTECTOMY  1995  . NASAL SEPTUM SURGERY    . TRACHEOSTOMY     s/p temp; at 83 yo for croup  . VESICOVAGINAL FISTULA CLOSURE W/ TAH      Allergies as of 11/07/2017      Reactions   Alendronate Sodium    REACTION: leg cramps   Atorvastatin    Ibandronate Sodium    REACTION: gi upset, and sluggish   Prednisone    Mouth and tongue swelling   Ramipril    Throat, mouth, and lip swelling   Statins  REACTION: myalgias; weakness in legs and arms      Medication List      aspirin 81 MG tablet Take 81 mg by mouth daily.   BENADRYL PO Take by mouth.   hydrochlorothiazide 25 MG tablet Commonly known as:  HYDRODIURIL Take 1 tablet (25 mg total) by mouth daily.   multivitamin tablet Take 1 tablet by mouth daily.   PRIMATENE ASTHMA PO Take by mouth.   PROAIR HFA 108 (90 Base) MCG/ACT inhaler Generic drug:  albuterol inhale 2 puffs by mouth every 4 hours if needed for wheezing and shortness of breath   triamcinolone 55 MCG/ACT Aero nasal inhaler Commonly known as:   NASACORT Place 2 sprays into the nose daily.   VITAMIN C PO Take by mouth.   vitamin E 400 UNIT capsule Take 400 Units by mouth daily.       Review of systems negative except as noted in HPI / PMHx or noted below:  Review of Systems  Constitutional: Negative.   HENT: Negative.   Eyes: Negative.   Respiratory: Negative.   Cardiovascular: Negative.   Gastrointestinal: Negative.   Genitourinary: Negative.   Musculoskeletal: Negative.   Skin: Negative.   Neurological: Negative.   Endo/Heme/Allergies: Negative.   Psychiatric/Behavioral: Negative.     Family History  Problem Relation Age of Onset  . Stroke Father   . Heart attack Mother   . Asthma Sister   . Stomach cancer Maternal Grandfather   . Rheum arthritis Sister   . Rheum arthritis Sister     Social History   Socioeconomic History  . Marital status: Divorced    Spouse name: Not on file  . Number of children: 1  . Years of education: Not on file  . Highest education level: Not on file  Social Needs  . Financial resource strain: Not on file  . Food insecurity - worry: Not on file  . Food insecurity - inability: Not on file  . Transportation needs - medical: Not on file  . Transportation needs - non-medical: Not on file  Occupational History  . Occupation: AD[employer]IONS[employer]    Employer: [employer]  Tobacco Use  . Smoking status: Never Smoker  . Smokeless tobacco: Never Used  Substance and Sexual Activity  . Alcohol use: Yes  . Drug use: No  . Sexual activity: Not on file  Other Topics Concern  . Not on file  Social History Narrative  . Not on file    Environmental and Social history  Lives in a house with a dry environment, no animals located inside the household, carpet in the bedroom, plastic on the bed, plastic on the pillow, and no smokers located inside the household.  Objective:   Vitals:   11/07/17 0827  BP: (!) 170/98  Pulse: 76  Resp: 14  Temp: 98.3 F (36.8 C)   Height:  4' 10.86" (149.5 cm) Weight: 147 lb 9.6 oz (67 kg)  Physical Exam  Constitutional: She is well-developed, well-nourished, and in no distress.  Coughing  HENT:  Head: Normocephalic. Head is without right periorbital erythema and without left periorbital erythema.  Right Ear: Tympanic membrane, external ear and ear canal normal.  Left Ear: Tympanic membrane, external ear and ear canal normal.  Nose: Nose normal. No mucosal edema or rhinorrhea.  Mouth/Throat: Oropharynx is clear and moist and mucous membranes are normal. No oropharyngeal exudate.  Eyes: Conjunctivae and lids are normal. Pupils are equal, round, and reactive to light.  Neck: Trachea  normal. No tracheal deviation present. No thyromegaly present.  Cardiovascular: Normal rate, regular rhythm, S1 normal, S2 normal and normal heart sounds.  No murmur heard. Pulmonary/Chest: Effort normal. No stridor. No tachypnea. No respiratory distress. She has wheezes (Bibasilar expiratory wheezes posterior lung field). She has no rales. She exhibits no tenderness.  Abdominal: Soft. She exhibits no distension and no mass. There is no hepatosplenomegaly. There is no tenderness. There is no rebound and no guarding.  Musculoskeletal: She exhibits no edema or tenderness.  Lymphadenopathy:       Head (right side): No tonsillar adenopathy present.       Head (left side): No tonsillar adenopathy present.    She has no cervical adenopathy.    She has no axillary adenopathy.  Neurological: She is alert. Gait normal.  Skin: No rash noted. She is not diaphoretic. No erythema. No pallor. Nails show no clubbing.  Psychiatric: Mood and affect normal.    Diagnostics: Allergy skin tests were performed.  She demonstrated hypersensitivity to house dust mite, mold, and cat.  Spirometry was performed and demonstrated an FEV1 of 0.91 @ 61 % of predicted. FEV1/FVC = 0.62.  Following administration of nebulized albuterol her FEV1 did not improve.  Assessment and  Plan:    1. Not well controlled mild persistent asthma   2. Other allergic rhinitis   3. Gastroesophageal reflux disease, esophagitis presence not specified     1.  Allergen avoidance measures  2.  Treat and prevent inflammation:   A.  Depo-Medrol 80 IM delivered in clinic today  B.  Arnuity 200 - 1 inhalation 1 time per day  C.  OTC Nasacort/Rhinocort/Flonase -1 spray each nostril 1 time per day  3.  If needed:   A.  Pro Air HFA 2 puffs every 4-6 hours  B.  OTC antihistamine -Claritin/Zyrtec/Allegra  C.  Ranitidine 150-300 mg daily  4.  Further evaluation and treatment?  Immunotherapy?  5.  Return to clinic in 4 weeks or earlier if problem  Sierra Robbins appears to have atopic disease which we will treat with a combination of allergen avoidance measures and the use of anti-inflammatory medications as noted above.  She does appear to be in a flare at this point and I will give her a systemic steroid to address this issue.  I also made some suggestions about treating her reflux.  I will see her back in this clinic in 4 weeks or earlier to assess her response to this approach.  Sierra PriestEric J. Madalen Gavin, MD Allergy / Immunology Sierra Robbins [facility]

## 2017-11-08 ENCOUNTER — Encounter: Payer: Self-pay | Admitting: Allergy and Immunology

## 2017-11-15 NOTE — Assessment & Plan Note (Signed)
Good control over the last year with only occasional need for rescue inhaler and no major exacerbations.  Pattern now is mild, intermittent, uncomplicated.

## 2017-11-15 NOTE — Assessment & Plan Note (Signed)
Flonase gives headaches we suggested she try Nasacort for comparison.  Discussed options.

## 2017-12-05 ENCOUNTER — Ambulatory Visit: Payer: Medicare Other | Admitting: Allergy and Immunology

## 2017-12-11 ENCOUNTER — Other Ambulatory Visit (INDEPENDENT_AMBULATORY_CARE_PROVIDER_SITE_OTHER): Payer: Medicare Other

## 2017-12-11 DIAGNOSIS — Z0000 Encounter for general adult medical examination without abnormal findings: Secondary | ICD-10-CM

## 2017-12-11 LAB — LIPID PANEL
CHOL/HDL RATIO: 4
Cholesterol: 200 mg/dL (ref 0–200)
HDL: 52 mg/dL (ref >39.00)
LDL Cholesterol: 114 mg/dL — ABNORMAL HIGH (ref 0–99)
NONHDL: 148.03
Triglycerides: 172 mg/dL — ABNORMAL HIGH (ref 0.0–149.0)
VLDL: 34.4 mg/dL (ref 0.0–40.0)

## 2017-12-11 LAB — HEPATIC FUNCTION PANEL
ALT: 14 U/L (ref 0–35)
AST: 18 U/L (ref 0–37)
Albumin: 4.1 g/dL (ref 3.5–5.2)
Alkaline Phosphatase: 61 U/L (ref 39–117)
BILIRUBIN DIRECT: 0.1 mg/dL (ref 0.0–0.3)
Total Bilirubin: 0.7 mg/dL (ref 0.2–1.2)
Total Protein: 7.4 g/dL (ref 6.0–8.3)

## 2017-12-11 LAB — BASIC METABOLIC PANEL
BUN: 13 mg/dL (ref 6–23)
CO2: 30 mEq/L (ref 19–32)
Calcium: 9.3 mg/dL (ref 8.4–10.5)
Chloride: 101 mEq/L (ref 96–112)
Creatinine, Ser: 0.61 mg/dL (ref 0.40–1.20)
GFR: 100.17 mL/min (ref >60.00)
GLUCOSE: 92 mg/dL (ref 70–99)
POTASSIUM: 3.5 meq/L (ref 3.5–5.1)
Sodium: 140 mEq/L (ref 135–145)

## 2017-12-11 LAB — URINALYSIS, ROUTINE W REFLEX MICROSCOPIC
NITRITE: NEGATIVE
Specific Gravity, Urine: 1.025 (ref 1.000–1.030)
TOTAL PROTEIN, URINE-UPE24: NEGATIVE
Urine Glucose: NEGATIVE
Urobilinogen, UA: 0.2 (ref 0.0–1.0)
pH: 5.5 (ref 5.0–8.0)

## 2017-12-11 LAB — CBC WITH DIFFERENTIAL/PLATELET
Basophils Absolute: 0 10*3/uL (ref 0.0–0.1)
Basophils Relative: 0.7 % (ref 0.0–3.0)
EOS ABS: 0.4 10*3/uL (ref 0.0–0.7)
Eosinophils Relative: 5.7 % — ABNORMAL HIGH (ref 0.0–5.0)
HCT: 39.3 % (ref 36.0–46.0)
HEMOGLOBIN: 13.9 g/dL (ref 12.0–15.0)
Lymphocytes Relative: 33.1 % (ref 12.0–46.0)
Lymphs Abs: 2.1 10*3/uL (ref 0.7–4.0)
MCHC: 35.3 g/dL (ref 30.0–36.0)
MCV: 99.8 fl (ref 78.0–100.0)
MONO ABS: 0.6 10*3/uL (ref 0.1–1.0)
Monocytes Relative: 9.2 % (ref 3.0–12.0)
Neutro Abs: 3.3 10*3/uL (ref 1.4–7.7)
Neutrophils Relative %: 51.3 % (ref 43.0–77.0)
Platelets: 258 10*3/uL (ref 150.0–400.0)
RBC: 3.94 Mil/uL (ref 3.87–5.11)
RDW: 12.8 % (ref 11.5–15.5)
WBC: 6.3 10*3/uL (ref 4.0–10.5)

## 2017-12-11 LAB — TSH: TSH: 1.93 u[IU]/mL (ref 0.35–4.50)

## 2017-12-13 ENCOUNTER — Ambulatory Visit (INDEPENDENT_AMBULATORY_CARE_PROVIDER_SITE_OTHER): Payer: Medicare Other | Admitting: Internal Medicine

## 2017-12-13 ENCOUNTER — Encounter: Payer: Self-pay | Admitting: Internal Medicine

## 2017-12-13 VITALS — BP 124/84 | HR 65 | Temp 98.6°F | Ht 59.0 in | Wt 147.0 lb

## 2017-12-13 DIAGNOSIS — Z0000 Encounter for general adult medical examination without abnormal findings: Secondary | ICD-10-CM

## 2017-12-13 DIAGNOSIS — R3129 Other microscopic hematuria: Secondary | ICD-10-CM | POA: Diagnosis not present

## 2017-12-13 NOTE — Patient Instructions (Signed)
Please continue all other medications as before, and refills have been done if requested.  Please have the pharmacy call with any other refills you may need.  Please continue your efforts at being more active, low cholesterol diet, and weight control.  You are otherwise up to date with prevention measures today.  Please keep your appointments with your specialists as you may have planned  You will be contacted regarding the referral for: CT scan  Please return in 1 year for your yearly visit, or sooner if needed, with Lab testing done 3-5 days before

## 2017-12-13 NOTE — Progress Notes (Signed)
Subjective:    Patient ID: Sierra Robbins, female    DOB: 08/20/1935, 83 y.o.   MRN: [id number]  HPI  Here for wellness and f/u;  Overall doing ok;  Pt denies Chest pain, worsening SOB, DOE, wheezing, orthopnea, PND, worsening LE edema, palpitations, dizziness or syncope.  Pt denies neurological change such as new headache, facial or extremity weakness.  Pt denies polydipsia, polyuria, or low sugar symptoms. Pt states overall good compliance with treatment and medications, good tolerability, and has been trying to follow appropriate diet.  Pt denies worsening depressive symptoms, suicidal ideation or panic. No fever, night sweats, wt loss, loss of appetite, or other constitutional symptoms.  Pt states good ability with ADL's, has low fall risk, home safety reviewed and adequate, no other significant changes in hearing or vision, and occasionally active with exercise, spry for age.  Denies urinary symptoms such as dysuria, frequency, urgency, flank pain, hematuria or n/v, fever, chills. Wt Readings from Last 3 Encounters:  12/13/17 147 lb (66.7 kg)  11/07/17 147 lb 9.6 oz (67 kg)  09/13/17 149 lb (67.6 kg)   Past Medical History:  Diagnosis Date  . Allergic rhinitis   . Asthma   . Barrett's esophagus   . COPD (chronic obstructive pulmonary disease) (HCC)   . DJD (degenerative joint disease)    hand  . Endometriosis   . GERD (gastroesophageal reflux disease)   . History of shingles    Left upper arm/axilla  . Hyperlipidemia   . Hypertension   . Osteoporosis   . Vitamin D deficiency    Past Surgical History:  Procedure Laterality Date  . ABDOMINAL HYSTERECTOMY  1979  . APPENDECTOMY    . CHOLECYSTECTOMY  1995  . NASAL SEPTUM SURGERY    . TRACHEOSTOMY     s/p temp; at 83 yo for croup  . VESICOVAGINAL FISTULA CLOSURE W/ TAH      reports that she has never smoked. She has never used smokeless tobacco. She reports that she drinks alcohol. She reports that she does not use  drugs. family history includes Asthma in her sister; Heart attack in her mother; Rheum arthritis in her sister and sister; Stomach cancer in her maternal grandfather; Stroke in her father. Allergies  Allergen Reactions  . Alendronate Sodium     REACTION: leg cramps  . Atorvastatin   . Ibandronate Sodium     REACTION: gi upset, and sluggish  . Prednisone     Mouth and tongue swelling  . Ramipril     Throat, mouth, and lip swelling  . Statins     REACTION: myalgias; weakness in legs and arms   Current Outpatient Medications on File Prior to Visit  Medication Sig Dispense Refill  . Ascorbic Acid (VITAMIN C PO) Take by mouth.    Marland Kitchen aspirin 81 MG tablet Take 81 mg by mouth daily.    . DiphenhydrAMINE HCl (BENADRYL PO) Take by mouth.    Marland Kitchen ePHEDrine-GuaiFENesin (PRIMATENE ASTHMA PO) Take by mouth.    . hydrochlorothiazide (HYDRODIURIL) 25 MG tablet Take 1 tablet (25 mg total) by mouth daily. 90 tablet 3  . Multiple Vitamin (MULTIVITAMIN) tablet Take 1 tablet by mouth daily.    Marland Kitchen PROAIR HFA 108 (90 Base) MCG/ACT inhaler inhale 2 puffs by mouth every 4 hours if needed for wheezing and shortness of breath 8.5 g PRN  . triamcinolone (NASACORT) 55 MCG/ACT AERO nasal inhaler Place 2 sprays into the nose daily. 1 Inhaler 12  . vitamin  E 400 UNIT capsule Take 400 Units by mouth daily.     No current facility-administered medications on file prior to visit.     Review of Systems Constitutional: Negative for other unusual diaphoresis, sweats, appetite or weight changes HENT: Negative for other worsening hearing loss, ear pain, facial swelling, mouth sores or neck stiffness.   Eyes: Negative for other worsening pain, redness or other visual disturbance.  Respiratory: Negative for other stridor or swelling Cardiovascular: Negative for other palpitations or other chest pain  Gastrointestinal: Negative for worsening diarrhea or loose stools, blood in stool, distention or other pain Genitourinary:  Negative for hematuria, flank pain or other change in urine volume.  Musculoskeletal: Negative for myalgias or other joint swelling.  Skin: Negative for other color change, or other wound or worsening drainage.  Neurological: Negative for other syncope or numbness. Hematological: Negative for other adenopathy or swelling Psychiatric/Behavioral: Negative for hallucinations, other worsening agitation, SI, self-injury, or new decreased concentration All other system neg per pt    Objective:   Physical Exam BP 124/84   Pulse 65   Temp 98.6 F (37 C) (Oral)   Ht 4\' 11"  (1.499 m)   Wt 147 lb (66.7 kg)   SpO2 95%   BMI 29.69 kg/m  VS noted,  Constitutional: Pt is oriented to person, place, and time. Appears well-developed and well-nourished, in no significant distress and comfortable Head: Normocephalic and atraumatic  Eyes: Conjunctivae and EOM are normal. Pupils are equal, round, and reactive to light Right Ear: External ear normal without discharge Left Ear: External ear normal without discharge Nose: Nose without discharge or deformity Mouth/Throat: Oropharynx is without other ulcerations and moist  Neck: Normal range of motion. Neck supple. No JVD present. No tracheal deviation present or significant neck LA or mass Cardiovascular: Normal rate, regular rhythm, normal heart sounds and intact distal pulses.   Pulmonary/Chest: WOB normal and breath sounds without rales or wheezing  Abdominal: Soft. Bowel sounds are normal. NT. No HSM  Musculoskeletal: Normal range of motion. Exhibits no edema Lymphadenopathy: Has no other cervical adenopathy.  Neurological: Pt is alert and oriented to person, place, and time. Pt has normal reflexes. No cranial nerve deficit. Motor grossly intact, Gait intact Skin: Skin is warm and dry. No rash noted or new ulcerations Psychiatric:  Has normal mood and affect. Behavior is normal without agitation No other exam findings    Assessment & Plan:

## 2017-12-15 NOTE — Assessment & Plan Note (Signed)
Ok for CT with CM, declines urology for now or cysto

## 2017-12-15 NOTE — Assessment & Plan Note (Signed)

## 2017-12-18 ENCOUNTER — Telehealth: Payer: Self-pay | Admitting: Internal Medicine

## 2017-12-18 DIAGNOSIS — R319 Hematuria, unspecified: Secondary | ICD-10-CM

## 2017-12-18 NOTE — Telephone Encounter (Signed)
Order corrected

## 2017-12-18 NOTE — Telephone Encounter (Signed)
Copied from CRM [id number]. Topic: Quick Communication - See Telephone Encounter >> Dec 18, 2017  9:55 AM Arlyss Gandyichardson, Glynna Failla N, NT wrote: CRM for notification. See Telephone encounter for: 12/18/17. [facility] order for the abdominal CT changed to CT Abdomen WITH contrast. CB#: [phone]

## 2018-01-01 ENCOUNTER — Ambulatory Visit
Admission: RE | Admit: 2018-01-01 | Discharge: 2018-01-01 | Disposition: A | Discharge status: Home / Self Care | Payer: Medicare Other | Source: Ambulatory Visit | Attending: Internal Medicine | Admitting: Internal Medicine

## 2018-01-01 ENCOUNTER — Encounter: Payer: Self-pay | Admitting: Internal Medicine

## 2018-01-01 DIAGNOSIS — R319 Hematuria, unspecified: Secondary | ICD-10-CM

## 2018-01-01 MED ORDER — IOPAMIDOL (ISOVUE-300) INJECTION 61%
100.0000 mL | Freq: Once | INTRAVENOUS | Status: AC | PRN
  Administered 2018-01-01: 100 mL via INTRAVENOUS

## 2018-02-16 ENCOUNTER — Other Ambulatory Visit: Payer: Self-pay

## 2018-02-16 MED ORDER — HYDROCHLOROTHIAZIDE 25 MG PO TABS: 25.0000 mg | ORAL_TABLET | Freq: Every day | ORAL | 3 refills | Status: DC

## 2018-05-28 ENCOUNTER — Ambulatory Visit (INDEPENDENT_AMBULATORY_CARE_PROVIDER_SITE_OTHER): Payer: Medicare Other

## 2018-05-28 DIAGNOSIS — Z23 Encounter for immunization: Secondary | ICD-10-CM

## 2018-09-13 ENCOUNTER — Ambulatory Visit: Payer: Medicare Other | Admitting: Internal Medicine

## 2018-09-13 ENCOUNTER — Encounter: Payer: Self-pay | Admitting: Internal Medicine

## 2018-09-13 VITALS — BP 184/84 | HR 76 | Ht 61.0 in | Wt 146.6 lb

## 2018-09-13 DIAGNOSIS — J4531 Mild persistent asthma with (acute) exacerbation: Secondary | ICD-10-CM

## 2018-09-13 DIAGNOSIS — J45901 Unspecified asthma with (acute) exacerbation: Secondary | ICD-10-CM

## 2018-09-13 DIAGNOSIS — I10 Essential (primary) hypertension: Secondary | ICD-10-CM

## 2018-09-13 MED ORDER — METHYLPREDNISOLONE ACETATE 80 MG/ML IJ SUSP
80.0000 mg | Freq: Once | INTRAMUSCULAR | Status: AC
  Administered 2018-09-13: 80 mg via INTRAMUSCULAR

## 2018-09-13 MED ORDER — AMOXICILLIN 500 MG PO TABS: ORAL_TABLET | ORAL | 0 refills | Status: DC

## 2018-09-13 MED ORDER — FLUTICASONE FUROATE-VILANTEROL 100-25 MCG/INH IN AEPB: INHALATION_SPRAY | RESPIRATORY_TRACT | 12 refills | Status: DC

## 2018-09-13 MED ORDER — ALBUTEROL SULFATE HFA 108 (90 BASE) MCG/ACT IN AERS: INHALATION_SPRAY | RESPIRATORY_TRACT | 99 refills | Status: DC

## 2018-09-13 NOTE — Patient Instructions (Addendum)
Sample and script for Breo 100     Inhale 1 puff, then rinse mouth, once daily- maintenance (Pt to call back tomorrow for sample)  Order- depo 80   Dx asthma exacerbation  Script printed to hold for amoxacillin  I discourage Primatene, which raises BP  Try otc Mucinex-DM instead of Primatene tablets

## 2018-09-13 NOTE — Progress Notes (Signed)
HPI female never smoker followed for allergic rhinitis, asthma, complicated by GERD/Barrett's, HBP Office Spirometry 09/13/2016-moderate airway obstruction. FVC 1.71/79%, FEV1 1.08/67%, ratio 0.63, FEF 25-75 percent 0.49/40  -------------------------------------------------------------------------------- 09/13/17- 84 year old female never smoker followed for allergic rhinitis, asthma, complicated by GERD/Barrett's, HBP ---FOLLOW FOR: Asthma >> Breathing is unchanged since last OV. Reports SOB at times and increased sneezing. Stopped allergy vaccine a year ago as planned and only sneezes occasionally.  Says she is doing "very well".  Asks for an antibiotic to hold again this year.  Rarely needed rescue inhaler.  Flonase gives headache.  09/13/2018- 84 year old female never smoker followed for allergic rhinitis, asthma, complicated by GERD/Barrett's, HBP -----Asthma: Pt states she has been having a slight flare ups-hear the wheezing and SOB at times-not too bad -able to use rescue inhaler and it helps. BP 184/84 on arrival, for recheck She is noted increased wheeze without obvious infection, increased clear phlegm, over the past 3-4 weeks.  Has been using Primatene tabs, one half at a time, to help clear her chest. Skin testing at allergist positive by her report for cats and mold but they did not offer allergy shots.  She asks for Depomedrol shot.  Says prednisone in the past had caused an episode of angioedema of lips, but no problem with Depo-Medrol.  We discussed trial of Breo as a maintenance inhaler (no samples avail today) and we will watch for possible reactions to steroid component. Again asks amoxicillin to hold in case needed later in the winter. CXR 09/13/2016-  No acute cardiopulmonary process. Primatene tablet, pro-air HFA  ROS-see HPI  + = positive Constitutional:   No-   weight loss, night sweats, fevers, chills, fatigue, lassitude. HEENT:   + headaches, no- difficulty swallowing,  tooth/dental problems, sore throat,       +sneezing, no-itching, ear ache, +nasal congestion, + post nasal drip,  CV:  No-   chest pain, orthopnea, PND, swelling in lower extremities, anasarca, dizziness, palpitations Resp: No-   shortness of breath with exertion or at rest.              No-   productive cough,  No non-productive cough,  No- coughing up of blood.              No-   change in color of mucus.  + wheezing.   Skin: No-   rash or lesions. GI:  No-   heartburn, indigestion, abdominal pain, nausea, vomiting,  GU: MS:  No-   joint pain or swelling.   Neuro-     nothing unusual Psych:  No- change in mood or affect. No depression or anxiety.  No memory loss.  OBJ General- Alert, Oriented, Affect-appropriate, Distress- none acute Skin- rash-none, lesions- none, excoriation- none Lymphadenopathy- none Head- atraumatic            Eyes- Gross vision intact, PERRLA, conjunctivae clear secretions            Ears- Hearing, canals-normal            Nose- , no-Septal dev, mucus, polyps, erosion, perforation , + dry stuffy            Throat- Mallampati III , mucosa clear , drainage- none, tonsils- atrophic.                     + Dentures Neck- flexible , old sternal scar ( surgical complication of ?thyroid surgery?) , no stridor , thyroid nl, carotid no bruit Chest - symmetrical  excursion , unlabored           Heart/CV- RRR , no murmur , no gallop  , no rub, nl s1 s2                           - JVD- none , edema- none, stasis changes- none, varices- none           Lung- clear to P&A, wheeze- none, cough- none , dullness-none, rub- none           Chest wall- sternotomy scar Abd- Br/ Gen/ Rectal- Not done, not indicated Extrem- cyanosis- none, clubbing, none, atrophy- none, strength- nl Neuro- grossly intact to observation

## 2018-11-04 NOTE — Assessment & Plan Note (Signed)
Mild exacerbation over the last 3 weeks may be viral but no obvious infection and she is clear at this visit.  We discouraged Primatene tablets. Plan-sample and prescription Breo.  Discussed steroid inhalers and injection. Amoxicillin to hold as discussed.

## 2018-11-04 NOTE — Assessment & Plan Note (Signed)
BP on arrival was high as noted.  She is directed to her PCP.

## 2018-12-11 ENCOUNTER — Telehealth: Payer: Self-pay

## 2018-12-11 NOTE — Telephone Encounter (Signed)
Pt already has lab orders entered. She has been informed.   Copied from CRM [id number]. Topic: General - Other >> Dec 11, 2018 10:44 AM Jay Schlichter wrote: Reason for CRM: pt would like to have orders for labs for cpe - she would like to come this Friday  Please call [phone] . Leave message if no answer

## 2018-12-14 ENCOUNTER — Other Ambulatory Visit (INDEPENDENT_AMBULATORY_CARE_PROVIDER_SITE_OTHER): Payer: Medicare Other

## 2018-12-14 DIAGNOSIS — Z0000 Encounter for general adult medical examination without abnormal findings: Secondary | ICD-10-CM

## 2018-12-14 LAB — CBC WITH DIFFERENTIAL/PLATELET
Basophils Absolute: 0.1 10*3/uL (ref 0.0–0.1)
Basophils Relative: 2.4 % (ref 0.0–3.0)
Eosinophils Absolute: 0.3 10*3/uL (ref 0.0–0.7)
Eosinophils Relative: 5.5 % — ABNORMAL HIGH (ref 0.0–5.0)
HCT: 38.7 % (ref 36.0–46.0)
Hemoglobin: 13.7 g/dL (ref 12.0–15.0)
Lymphocytes Relative: 32.5 % (ref 12.0–46.0)
Lymphs Abs: 1.8 10*3/uL (ref 0.7–4.0)
MCHC: 35.4 g/dL (ref 30.0–36.0)
MCV: 101.9 fl — ABNORMAL HIGH (ref 78.0–100.0)
Monocytes Absolute: 0.6 10*3/uL (ref 0.1–1.0)
Monocytes Relative: 10.9 % (ref 3.0–12.0)
Neutro Abs: 2.7 10*3/uL (ref 1.4–7.7)
Neutrophils Relative %: 48.7 % (ref 43.0–77.0)
Platelets: 250 10*3/uL (ref 150.0–400.0)
RBC: 3.8 Mil/uL — ABNORMAL LOW (ref 3.87–5.11)
RDW: 12.9 % (ref 11.5–15.5)
WBC: 5.5 10*3/uL (ref 4.0–10.5)

## 2018-12-14 LAB — BASIC METABOLIC PANEL
BUN: 12 mg/dL (ref 6–23)
CO2: 31 mEq/L (ref 19–32)
Calcium: 9.5 mg/dL (ref 8.4–10.5)
Chloride: 101 mEq/L (ref 96–112)
Creatinine, Ser: 0.63 mg/dL (ref 0.40–1.20)
GFR: 90.58 mL/min (ref >60.00)
Glucose, Bld: 93 mg/dL (ref 70–99)
Potassium: 4.2 mEq/L (ref 3.5–5.1)
Sodium: 141 mEq/L (ref 135–145)

## 2018-12-14 LAB — URINALYSIS, ROUTINE W REFLEX MICROSCOPIC
Bilirubin Urine: NEGATIVE
Ketones, ur: NEGATIVE
Leukocytes,Ua: NEGATIVE
Nitrite: NEGATIVE
Specific Gravity, Urine: 1.02 (ref 1.000–1.030)
Total Protein, Urine: NEGATIVE
Urine Glucose: NEGATIVE
Urobilinogen, UA: 0.2 (ref 0.0–1.0)
pH: 6.5 (ref 5.0–8.0)

## 2018-12-14 LAB — LIPID PANEL
Cholesterol: 183 mg/dL (ref 0–200)
HDL: 41.9 mg/dL (ref >39.00)
LDL Cholesterol: 102 mg/dL — ABNORMAL HIGH (ref 0–99)
NonHDL: 141.1
Total CHOL/HDL Ratio: 4
Triglycerides: 198 mg/dL — ABNORMAL HIGH (ref 0.0–149.0)
VLDL: 39.6 mg/dL (ref 0.0–40.0)

## 2018-12-14 LAB — TSH: TSH: 1.77 u[IU]/mL (ref 0.35–4.50)

## 2018-12-14 LAB — HEPATIC FUNCTION PANEL
ALT: 13 U/L (ref 0–35)
AST: 20 U/L (ref 0–37)
Albumin: 4.1 g/dL (ref 3.5–5.2)
Alkaline Phosphatase: 64 U/L (ref 39–117)
Bilirubin, Direct: 0.2 mg/dL (ref 0.0–0.3)
Total Bilirubin: 0.9 mg/dL (ref 0.2–1.2)
Total Protein: 7.3 g/dL (ref 6.0–8.3)

## 2018-12-17 ENCOUNTER — Telehealth: Payer: Self-pay

## 2018-12-17 NOTE — Telephone Encounter (Signed)
Copied from CRM [id number]. Topic: General - Other >> Dec 14, 2018  5:36 PM Jay Schlichter wrote: Reason for CRM: pt got a missed call - no crm - please return call [phone] >> Dec 17, 2018  8:29 AM Peace, Tammy L wrote: I believe this could be in reference to appt scheduled for the 22nd with Dr. Jonny Ruiz.

## 2018-12-17 NOTE — Telephone Encounter (Signed)
Patient called to say that she is ok with the doctor calling and talking to her for her appointment since she does not have a smart phone or a computer. Please advise Ph#  [phone]

## 2018-12-17 NOTE — Telephone Encounter (Signed)
CPE has been rescheduled to 06/26.

## 2018-12-19 ENCOUNTER — Encounter: Payer: Self-pay | Admitting: Internal Medicine

## 2019-02-03 IMAGING — CT CT ABD-PELV W/ CM
2 of 5 series · 17 of 46 positions shown, 19 images · IV contrast (iopamidol)
Comparison: None.

CLINICAL DATA: Microscopic hematuria.

EXAM:
CT ABDOMEN AND PELVIS WITH CONTRAST
TECHNIQUE: Multidetector CT imaging of the abdomen and pelvis was performed
using the standard protocol following bolus administration of
intravenous contrast.
CONTRAST:  100mL [id number] IOPAMIDOL ([id number]) INJECTION 61%

[Series 2: abd pelvis 5.00 br40 s3 ax · axial · 0.71mm/px · z∈[+1099,+1474]mm · 14 of 85 slices shown, 16 images]
[im 5/85  soft-tissue]
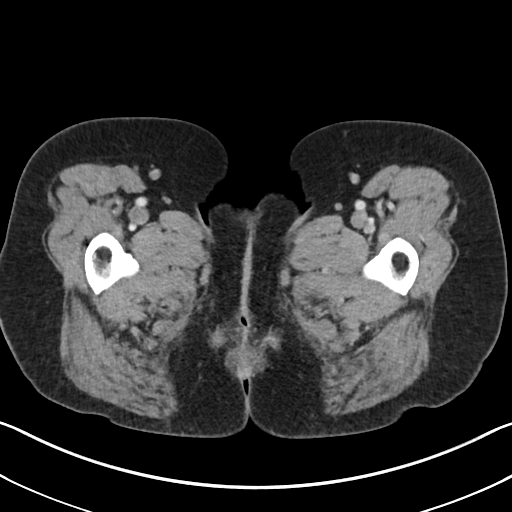
[im 5/85  bone]
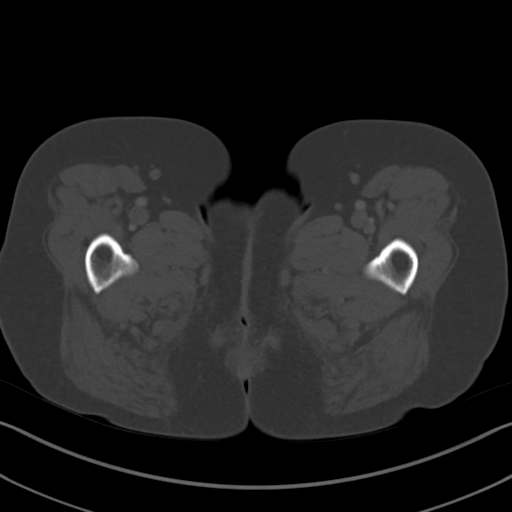
[im 9/85  soft-tissue]
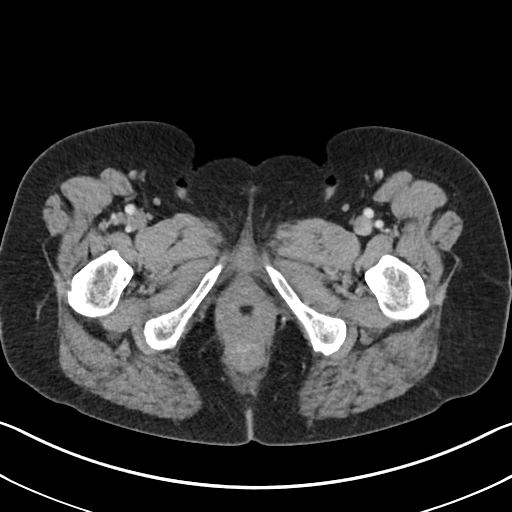
[im 18/85  soft-tissue]
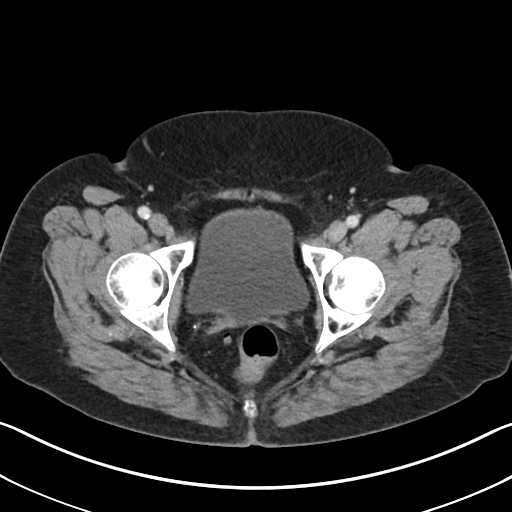
[im 23/85  soft-tissue]
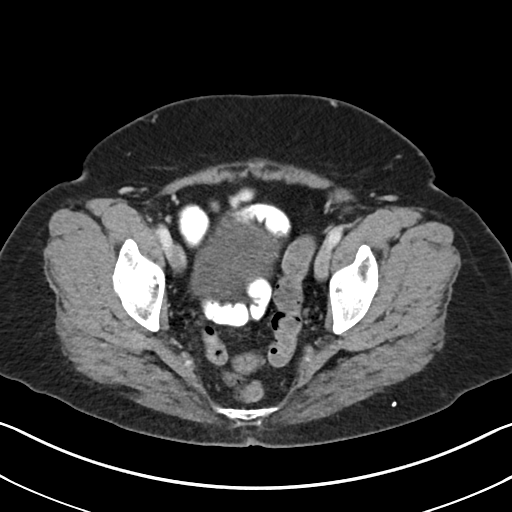
[im 27/85  soft-tissue]
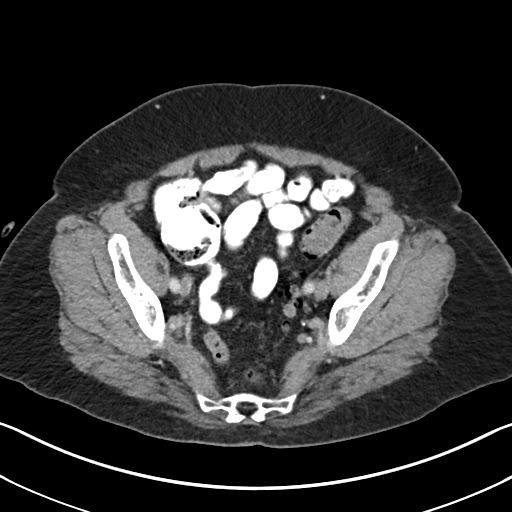
[im 36/85  soft-tissue]
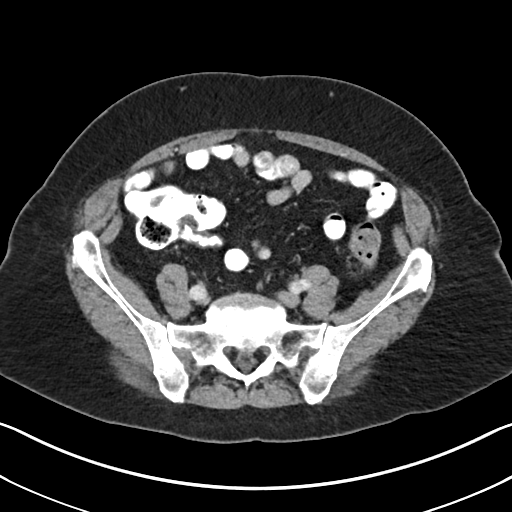
[im 40/85  soft-tissue]
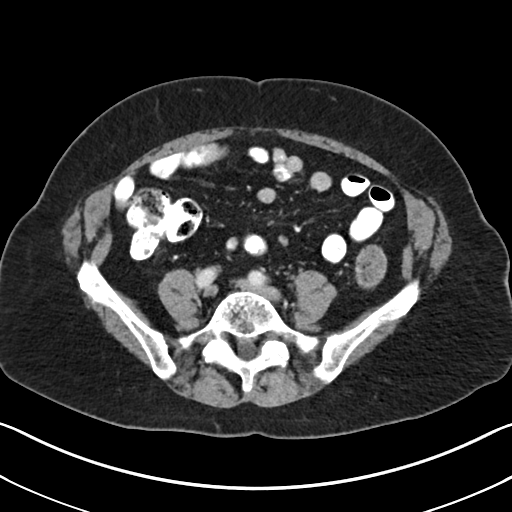
[im 45/85  soft-tissue]
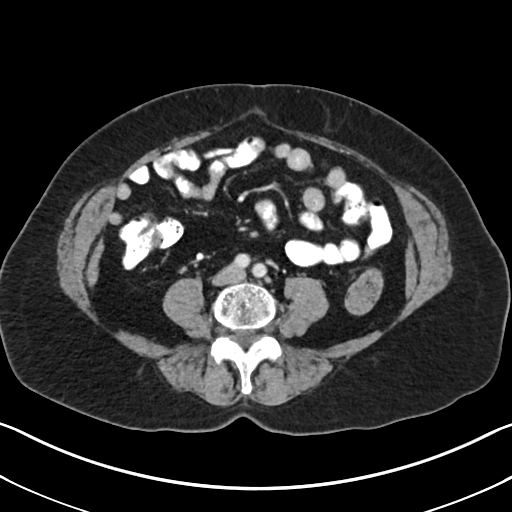
[im 49/85  soft-tissue]
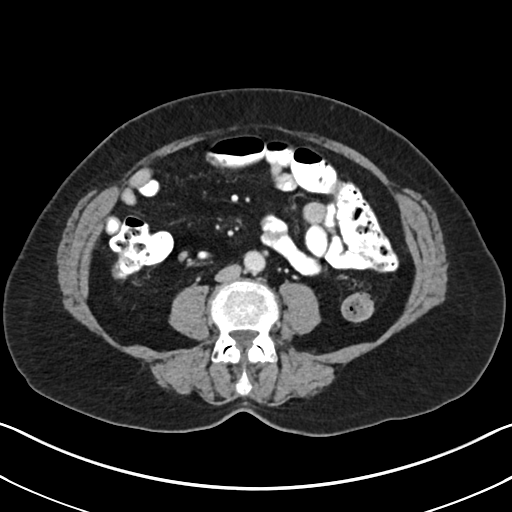
[im 49/85  bone]
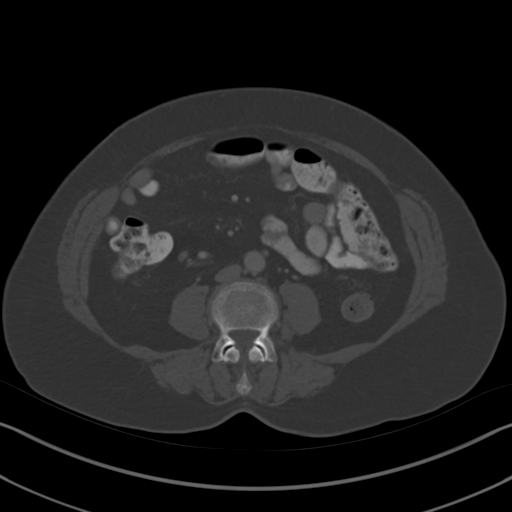
[im 58/85  soft-tissue]
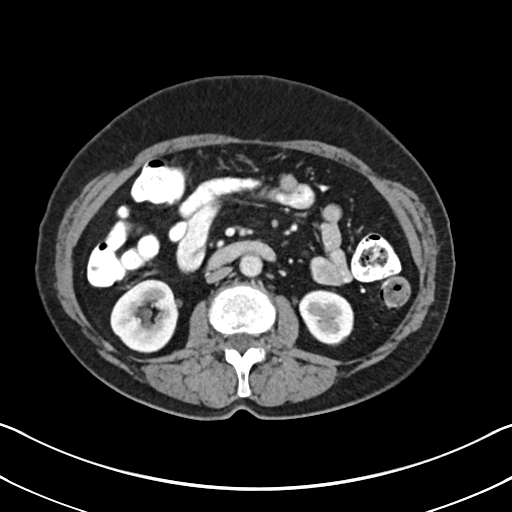
[im 62/85  soft-tissue]
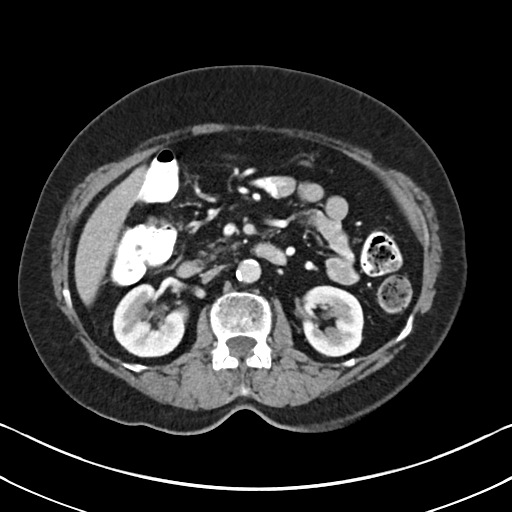
[im 67/85  soft-tissue]
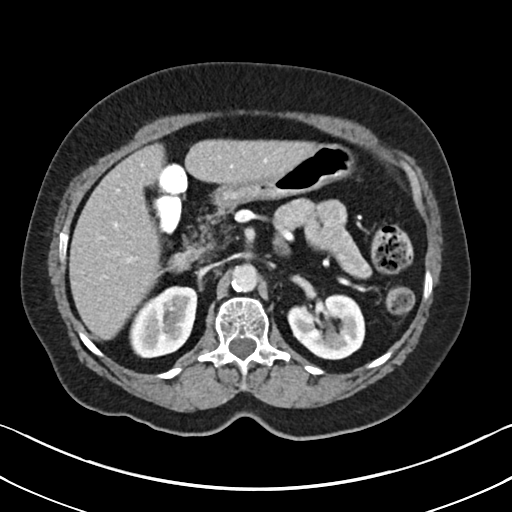
[im 76/85  soft-tissue]
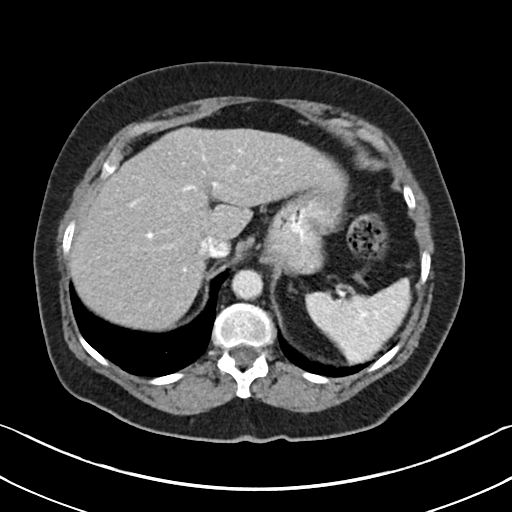
[im 80/85  soft-tissue]
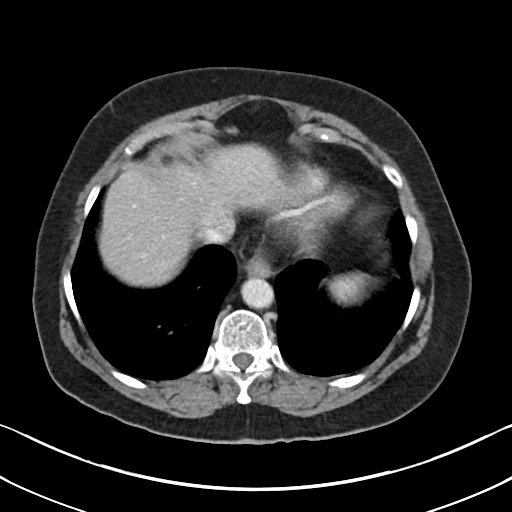

[Series 6: abd pelvis 2.00 br40 s3 cor · coronal · 0.70mm/px · 3 of 138 slices shown]
[im 46/138  soft-tissue]
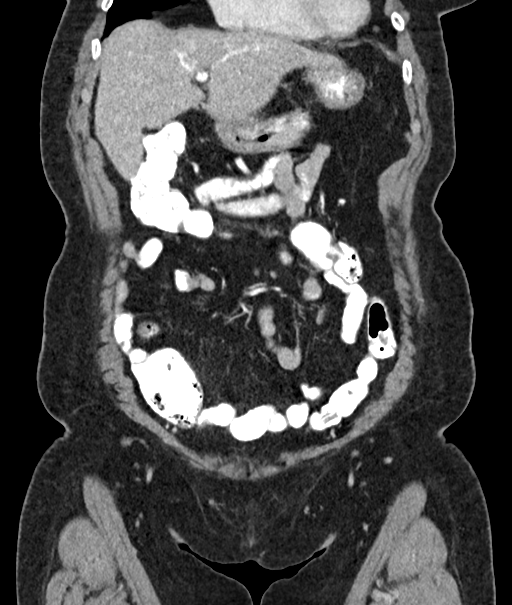
[im 61/138  soft-tissue]
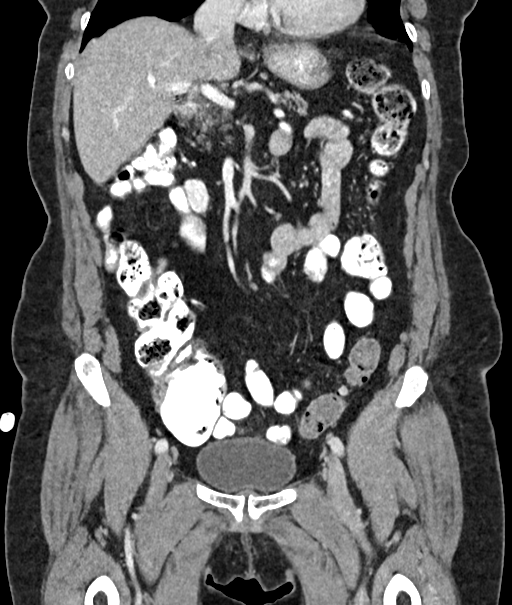
[im 77/138  soft-tissue]
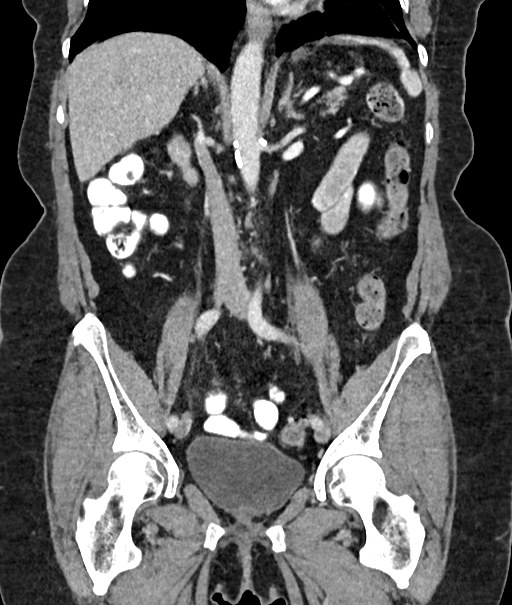

[17 of 46 positions shown; findings below may reference images not displayed]

FINDINGS: Lower chest: No acute abnormality.

Hepatobiliary: No focal liver abnormality is seen. Status post
cholecystectomy. No biliary dilatation.

Pancreas: Unremarkable. No pancreatic ductal dilatation or
surrounding inflammatory changes.

Spleen: Normal in size without focal abnormality.

Adrenals/Urinary Tract: Adrenal glands are unremarkable. Kidneys are
normal, without renal calculi, focal lesion, or hydronephrosis.
Bladder is unremarkable.

Stomach/Bowel: Stomach is within normal limits. Post appendectomy.
No evidence of bowel wall thickening, distention, or inflammatory
changes.

Vascular/Lymphatic: Aortic atherosclerosis. No enlarged abdominal or
pelvic lymph nodes.

Reproductive: Status post hysterectomy. No adnexal masses.

Other: No abdominal wall hernia or abnormality. No abdominopelvic
ascites.

Musculoskeletal: No suspicious osseous lesions. Mild spondylosis of
the lumbosacral spine.
IMPRESSION: No evidence of acute abnormalities within the abdomen or pelvis.

## 2019-02-21 ENCOUNTER — Other Ambulatory Visit: Payer: Self-pay | Admitting: Internal Medicine

## 2019-02-22 ENCOUNTER — Ambulatory Visit (INDEPENDENT_AMBULATORY_CARE_PROVIDER_SITE_OTHER): Payer: Medicare Other | Admitting: Internal Medicine

## 2019-02-22 ENCOUNTER — Other Ambulatory Visit: Payer: Self-pay

## 2019-02-22 ENCOUNTER — Encounter: Payer: Self-pay | Admitting: Internal Medicine

## 2019-02-22 VITALS — BP 128/86 | HR 77 | Temp 98.8°F | Ht 61.0 in | Wt 143.0 lb

## 2019-02-22 DIAGNOSIS — Z0000 Encounter for general adult medical examination without abnormal findings: Secondary | ICD-10-CM

## 2019-02-22 DIAGNOSIS — E559 Vitamin D deficiency, unspecified: Secondary | ICD-10-CM

## 2019-02-22 DIAGNOSIS — Z23 Encounter for immunization: Secondary | ICD-10-CM | POA: Diagnosis not present

## 2019-02-22 DIAGNOSIS — E538 Deficiency of other specified B group vitamins: Secondary | ICD-10-CM

## 2019-02-22 DIAGNOSIS — E611 Iron deficiency: Secondary | ICD-10-CM | POA: Diagnosis not present

## 2019-02-22 DIAGNOSIS — I10 Essential (primary) hypertension: Secondary | ICD-10-CM

## 2019-02-22 MED ORDER — AMITRIPTYLINE HCL 25 MG PO TABS: 12.0000 mg | ORAL_TABLET | Freq: Every evening | ORAL | 1 refills | Status: DC | PRN

## 2019-02-22 MED ORDER — HYDROCHLOROTHIAZIDE 25 MG PO TABS: ORAL_TABLET | ORAL | 1 refills | Status: DC

## 2019-02-22 MED ORDER — HYDROCHLOROTHIAZIDE 25 MG PO TABS: ORAL_TABLET | ORAL | 3 refills | Status: DC

## 2019-02-22 NOTE — Patient Instructions (Addendum)
You had the Tdap tetanus shot today  Please continue all other medications as before, and refills have been done if requested.  Please have the pharmacy call with any other refills you may need.  Please continue your efforts at being more active, low cholesterol diet, and weight control.  You are otherwise up to date with prevention measures today.  Please keep your appointments with your specialists as you may have planned  Please return in 1 year for your yearly visit, or sooner if needed, with Lab testing done 3-5 days before  

## 2019-02-22 NOTE — Progress Notes (Signed)
Subjective:    Patient ID: Sierra Robbins, female    DOB: 10-Mar-1935, 84 y.o.   MRN: [id number]  HPI  Here for wellness and f/u;  Overall doing ok;  Pt denies Chest pain, worsening SOB, DOE, wheezing, orthopnea, PND, worsening LE edema, palpitations, dizziness or syncope.  Pt denies neurological change such as new headache, facial or extremity weakness.  Pt denies polydipsia, polyuria, or low sugar symptoms. Pt states overall good compliance with treatment and medications, good tolerability, and has been trying to follow appropriate diet.  Pt denies worsening depressive symptoms, suicidal ideation or panic. No fever, night sweats, wt loss, loss of appetite, or other constitutional symptoms.  Pt states good ability with ADL's, has low fall risk, home safety reviewed and adequate, no other significant changes in hearing or vision, and only occasionally active with exercise.  No new complaints Past Medical History:  Diagnosis Date  . Allergic rhinitis   . Asthma   . Barrett's esophagus   . COPD (chronic obstructive pulmonary disease) ([facility])   . DJD (degenerative joint disease)    hand  . Endometriosis   . GERD (gastroesophageal reflux disease)   . History of shingles    Left upper arm/axilla  . Hyperlipidemia   . Hypertension   . Osteoporosis   . Vitamin D deficiency    Past Surgical History:  Procedure Laterality Date  . ABDOMINAL HYSTERECTOMY  1979  . APPENDECTOMY    . CHOLECYSTECTOMY  1995  . NASAL SEPTUM SURGERY    . TRACHEOSTOMY     s/p temp; at 84 yo for croup  . VESICOVAGINAL FISTULA CLOSURE W/ TAH      reports that she has never smoked. She has never used smokeless tobacco. She reports current alcohol use. She reports that she does not use drugs. family history includes Asthma in her sister; Heart attack in her mother; Rheum arthritis in her sister and sister; Stomach cancer in her maternal grandfather; Stroke in her father. Allergies  Allergen Reactions  . Alendronate  Sodium     REACTION: leg cramps  . Atorvastatin   . Ibandronate Sodium     REACTION: gi upset, and sluggish  . Prednisone     Mouth and tongue swelling  . Ramipril     Throat, mouth, and lip swelling  . Statins     REACTION: myalgias; weakness in legs and arms   Current Outpatient Medications on File Prior to Visit  Medication Sig Dispense Refill  . albuterol (PROAIR HFA) 108 (90 Base) MCG/ACT inhaler inhale 2 puffs by mouth every 4 hours if needed for wheezing and shortness of breath 8.5 g PRN  . Ascorbic Acid (VITAMIN C PO) Take by mouth.    Marland Kitchen. aspirin 81 MG tablet Take 81 mg by mouth daily.    . DiphenhydrAMINE HCl (BENADRYL PO) Take by mouth.    Marland Kitchen. ePHEDrine-GuaiFENesin (PRIMATENE ASTHMA PO) Take by mouth.    . fluticasone furoate-vilanterol (BREO ELLIPTA) 100-25 MCG/INH AEPB Inhale 1 puff then rinse mouth, once daily 60 each 12  . Multiple Vitamin (MULTIVITAMIN) tablet Take 1 tablet by mouth daily.    Marland Kitchen. triamcinolone (NASACORT) 55 MCG/ACT AERO nasal inhaler Place 2 sprays into the nose daily. 1 Inhaler 12  . vitamin E 400 UNIT capsule Take 400 Units by mouth daily.     No current facility-administered medications on file prior to visit.    Review of Systems Constitutional: Negative for other unusual diaphoresis, sweats, appetite or weight changes HENT:  Negative for other worsening hearing loss, ear pain, facial swelling, mouth sores or neck stiffness.   Eyes: Negative for other worsening pain, redness or other visual disturbance.  Respiratory: Negative for other stridor or swelling Cardiovascular: Negative for other palpitations or other chest pain  Gastrointestinal: Negative for worsening diarrhea or loose stools, blood in stool, distention or other pain Genitourinary: Negative for hematuria, flank pain or other change in urine volume.  Musculoskeletal: Negative for myalgias or other joint swelling.  Skin: Negative for other color change, or other wound or worsening drainage.   Neurological: Negative for other syncope or numbness. Hematological: Negative for other adenopathy or swelling Psychiatric/Behavioral: Negative for hallucinations, other worsening agitation, SI, self-injury, or new decreased concentration All other system neg per pt    Objective:   Physical Exam BP 128/86   Pulse 77   Temp 98.8 F (37.1 C) (Oral)   Ht 5\' 1"  (1.549 m)   Wt 143 lb (64.9 kg)   SpO2 96%   BMI 27.02 kg/m  VS noted,  Constitutional: Pt is oriented to person, place, and time. Appears well-developed and well-nourished, in no significant distress and comfortable Head: Normocephalic and atraumatic  Eyes: Conjunctivae and EOM are normal. Pupils are equal, round, and reactive to light Right Ear: External ear normal without discharge Left Ear: External ear normal without discharge Nose: Nose without discharge or deformity Mouth/Throat: Oropharynx is without other ulcerations and moist  Neck: Normal range of motion. Neck supple. No JVD present. No tracheal deviation present or significant neck LA or mass Cardiovascular: Normal rate, regular rhythm, normal heart sounds and intact distal pulses.   Pulmonary/Chest: WOB normal and breath sounds without rales or wheezing  Abdominal: Soft. Bowel sounds are normal. NT. No HSM  Musculoskeletal: Normal range of motion. Exhibits no edema Lymphadenopathy: Has no other cervical adenopathy.  Neurological: Pt is alert and oriented to person, place, and time. Pt has normal reflexes. No cranial nerve deficit. Motor grossly intact, Gait intact Skin: Skin is warm and dry. No rash noted or new ulcerations Psychiatric:  Has normal mood and affect. Behavior is normal without agitation No other exam findings Lab Results  Component Value Date   WBC 5.5 12/14/2018   HGB 13.7 12/14/2018   HCT 38.7 12/14/2018   PLT 250.0 12/14/2018   GLUCOSE 93 12/14/2018   CHOL 183 12/14/2018   TRIG 198.0 (H) 12/14/2018   HDL 41.90 12/14/2018   LDLDIRECT 106.0  12/02/2015   LDLCALC 102 (H) 12/14/2018   ALT 13 12/14/2018   AST 20 12/14/2018   NA 141 12/14/2018   K 4.2 12/14/2018   CL 101 12/14/2018   CREATININE 0.63 12/14/2018   BUN 12 12/14/2018   CO2 31 12/14/2018   TSH 1.77 12/14/2018        Assessment & Plan:

## 2019-02-24 ENCOUNTER — Encounter: Payer: Self-pay | Admitting: Internal Medicine

## 2019-02-24 NOTE — Assessment & Plan Note (Signed)
Also for vit d level,  to f/u any worsening symptoms or concerns  

## 2019-02-24 NOTE — Assessment & Plan Note (Signed)
stable overall by history and exam, recent data reviewed with pt, and pt to continue medical treatment as before,  to f/u any worsening symptoms or concerns  

## 2019-02-24 NOTE — Assessment & Plan Note (Signed)

## 2019-06-13 ENCOUNTER — Other Ambulatory Visit: Payer: Self-pay

## 2019-06-13 ENCOUNTER — Ambulatory Visit (INDEPENDENT_AMBULATORY_CARE_PROVIDER_SITE_OTHER): Payer: Medicare Other

## 2019-06-13 DIAGNOSIS — Z23 Encounter for immunization: Secondary | ICD-10-CM

## 2019-09-16 ENCOUNTER — Other Ambulatory Visit: Payer: Self-pay | Admitting: Internal Medicine

## 2019-09-23 ENCOUNTER — Ambulatory Visit (INDEPENDENT_AMBULATORY_CARE_PROVIDER_SITE_OTHER): Payer: Medicare Other | Admitting: Internal Medicine

## 2019-09-23 ENCOUNTER — Encounter: Payer: Self-pay | Admitting: Internal Medicine

## 2019-09-23 ENCOUNTER — Other Ambulatory Visit: Payer: Self-pay

## 2019-09-23 DIAGNOSIS — J4530 Mild persistent asthma, uncomplicated: Secondary | ICD-10-CM | POA: Diagnosis not present

## 2019-09-23 DIAGNOSIS — J3089 Other allergic rhinitis: Secondary | ICD-10-CM | POA: Diagnosis not present

## 2019-09-23 DIAGNOSIS — J302 Other seasonal allergic rhinitis: Secondary | ICD-10-CM | POA: Diagnosis not present

## 2019-09-23 MED ORDER — ALBUTEROL SULFATE HFA 108 (90 BASE) MCG/ACT IN AERS: INHALATION_SPRAY | RESPIRATORY_TRACT | 99 refills | Status: DC

## 2019-09-23 MED ORDER — AMOXICILLIN 500 MG PO TABS: 500.0000 mg | ORAL_TABLET | Freq: Two times a day (BID) | ORAL | 5 refills | Status: DC

## 2019-09-23 NOTE — Assessment & Plan Note (Signed)
Ok to continue flonase, sufficient for now.

## 2019-09-23 NOTE — Assessment & Plan Note (Signed)
Mild intermittent uncomplicated this year. Plan- refill rescue inhaler. Discussed potential indication for restarting maintenance inhaler.

## 2019-09-23 NOTE — Patient Instructions (Signed)
Refill scripts sent for amoxacillin and Proair rescue inhaler to use if needed  Please call if we can help

## 2019-09-23 NOTE — Progress Notes (Signed)
HPI female never smoker followed for allergic rhinitis, asthma, complicated by GERD/Barrett's, HBP Office Spirometry 09/13/2016-moderate airway obstruction. FVC 1.71/79%, FEV1 1.08/67%, ratio 0.63, FEF 25-75 percent 0.49/40  --------------------------------------------------------------------------------  09/13/2018- 85 year old female never smoker followed for allergic rhinitis, asthma, complicated by GERD/Barrett's, HBP -----Asthma: Pt states she has been having a slight flare ups-hear the wheezing and SOB at times-not too bad -able to use rescue inhaler and it helps. BP 184/84 on arrival, for recheck She is noted increased wheeze without obvious infection, increased clear phlegm, over the past 3-4 weeks.  Has been using Primatene tabs, one half at a time, to help clear her chest. Skin testing at allergist positive by her report for cats and mold but they did not offer allergy shots.  She asks for Depomedrol shot.  Says prednisone in the past had caused an episode of angioedema of lips, but no problem with Depo-Medrol.  We discussed trial of Breo as a maintenance inhaler (no samples avail today) and we will watch for possible reactions to steroid component. Again asks amoxicillin to hold in case needed later in the winter. CXR 09/13/2016-  No acute cardiopulmonary process. Primatene tablet, pro-air [facility]  09/23/19- Virtual Visit via Telephone Note  I connected with Sierra Robbins on 09/23/19 at 10:30 AM EST by telephone and verified that I am speaking with the correct person using two identifiers.  Location: Patient: H Provider: O   I discussed the limitations, risks, security and privacy concerns of performing an evaluation and management service by telephone and the availability of in person appointments. I also discussed with the patient that there may be a patient responsible charge related to this service. The patient expressed understanding and agreed to proceed.   History of Present  Illness: 86 year old female never smoker followed for allergic rhinitis, asthma, complicated by GERD/Barrett's, HBP ------f/u mild persistent asthma  Albuterol hfa, Breo 100, Nasacort Excellent year with no significant aspiration.  Last used rescue inhaler about 2 weeks ago and not needing Breo. Asks to keep amoxacillin available- uses rarely for acute URI.  Some nasal congestion- uses flonase when needed.  Denies other health issues.   Observations/Objective:   Assessment and Plan: Asthma- mild intermittent uncomplicated. Rescue inhaler sufficient this year. Allergic Rhinitis- usually Flonase sufficient  Follow Up Instructions: 1 year   I discussed the assessment and treatment plan with the patient. The patient was provided an opportunity to ask questions and all were answered. The patient agreed with the plan and demonstrated an understanding of the instructions.   The patient was advised to call back or seek an in-person evaluation if the symptoms worsen or if the condition fails to improve as anticipated.  I provided 17 minutes of non-face-to-face time during this encounter.   Baird Lyons, MD    ROS-see HPI  + = positive Constitutional:   No-   weight loss, night sweats, fevers, chills, fatigue, lassitude. HEENT:   + headaches, no- difficulty swallowing, tooth/dental problems, sore throat,       +sneezing, no-itching, ear ache, +nasal congestion, + post nasal drip,  CV:  No-   chest pain, orthopnea, PND, swelling in lower extremities, anasarca, dizziness, palpitations Resp: No-   shortness of breath with exertion or at rest.              No-   productive cough,  No non-productive cough,  No- coughing up of blood.              No-  change in color of mucus.  + wheezing.   Skin: No-   rash or lesions. GI:  No-   heartburn, indigestion, abdominal pain, nausea, vomiting,  GU: MS:  No-   joint pain or swelling.   Neuro-     nothing unusual Psych:  No- change in mood or  affect. No depression or anxiety.  No memory loss.  OBJ General- Alert, Oriented, Affect-appropriate, Distress- none acute Skin- rash-none, lesions- none, excoriation- none Lymphadenopathy- none Head- atraumatic            Eyes- Gross vision intact, PERRLA, conjunctivae clear secretions            Ears- Hearing, canals-normal            Nose- , no-Septal dev, mucus, polyps, erosion, perforation , + dry stuffy            Throat- Mallampati III , mucosa clear , drainage- none, tonsils- atrophic.                     + Dentures Neck- flexible , old sternal scar ( surgical complication of ?thyroid surgery?) , no stridor , thyroid nl, carotid no bruit Chest - symmetrical excursion , unlabored           Heart/CV- RRR , no murmur , no gallop  , no rub, nl s1 s2                           - JVD- none , edema- none, stasis changes- none, varices- none           Lung- clear to P&A, wheeze- none, cough- none , dullness-none, rub- none           Chest wall- sternotomy scar Abd- Br/ Gen/ Rectal- Not done, not indicated Extrem- cyanosis- none, clubbing, none, atrophy- none, strength- nl Neuro- grossly intact to observation

## 2019-11-08 ENCOUNTER — Encounter (HOSPITAL_COMMUNITY): Payer: Self-pay

## 2019-11-08 ENCOUNTER — Emergency Department (HOSPITAL_COMMUNITY)
Admission: EM | Admit: 2019-11-08 | Discharge: 2019-11-08 | Disposition: A | Discharge status: Home / Self Care | Payer: Medicare Other | Attending: Emergency Medicine | Admitting: Emergency Medicine

## 2019-11-08 ENCOUNTER — Other Ambulatory Visit: Payer: Self-pay

## 2019-11-08 ENCOUNTER — Encounter (HOSPITAL_COMMUNITY): Payer: Self-pay | Admitting: Emergency Medicine

## 2019-11-08 ENCOUNTER — Ambulatory Visit (HOSPITAL_COMMUNITY)
Admission: EM | Admit: 2019-11-08 | Discharge: 2019-11-08 | Disposition: A | Discharge status: Other Acute Inpatient Hospital | Payer: Medicare Other | Source: Home / Self Care

## 2019-11-08 DIAGNOSIS — I10 Essential (primary) hypertension: Secondary | ICD-10-CM | POA: Insufficient documentation

## 2019-11-08 DIAGNOSIS — I160 Hypertensive urgency: Secondary | ICD-10-CM

## 2019-11-08 DIAGNOSIS — H1131 Conjunctival hemorrhage, right eye: Secondary | ICD-10-CM | POA: Insufficient documentation

## 2019-11-08 DIAGNOSIS — R531 Weakness: Secondary | ICD-10-CM | POA: Diagnosis not present

## 2019-11-08 DIAGNOSIS — Z79899 Other long term (current) drug therapy: Secondary | ICD-10-CM | POA: Insufficient documentation

## 2019-11-08 DIAGNOSIS — J449 Chronic obstructive pulmonary disease, unspecified: Secondary | ICD-10-CM | POA: Diagnosis not present

## 2019-11-08 DIAGNOSIS — R251 Tremor, unspecified: Secondary | ICD-10-CM

## 2019-11-08 DIAGNOSIS — R030 Elevated blood-pressure reading, without diagnosis of hypertension: Secondary | ICD-10-CM

## 2019-11-08 LAB — BASIC METABOLIC PANEL
Anion gap: 12 (ref 5–15)
BUN: 10 mg/dL (ref 8–23)
CO2: 27 mmol/L (ref 22–32)
Calcium: 9.6 mg/dL (ref 8.9–10.3)
Chloride: 99 mmol/L (ref 98–111)
Creatinine, Ser: 0.61 mg/dL (ref 0.44–1.00)
GFR calc Af Amer: >60 mL/min (ref >60)
GFR calc non Af Amer: >60 mL/min (ref >60)
Glucose, Bld: 104 mg/dL — ABNORMAL HIGH (ref 70–99)
Potassium: 3.6 mmol/L (ref 3.5–5.1)
Sodium: 138 mmol/L (ref 135–145)

## 2019-11-08 LAB — CBC WITH DIFFERENTIAL/PLATELET
Abs Immature Granulocytes: 0.02 10*3/uL (ref 0.00–0.07)
Basophils Absolute: 0.1 10*3/uL (ref 0.0–0.1)
Basophils Relative: 1 %
Eosinophils Absolute: 0.4 10*3/uL (ref 0.0–0.5)
Eosinophils Relative: 6 %
HCT: 39.5 % (ref 36.0–46.0)
Hemoglobin: 13.9 g/dL (ref 12.0–15.0)
Immature Granulocytes: 0 %
Lymphocytes Relative: 41 %
Lymphs Abs: 2.7 10*3/uL (ref 0.7–4.0)
MCH: 34.6 pg — ABNORMAL HIGH (ref 26.0–34.0)
MCHC: 35.2 g/dL (ref 30.0–36.0)
MCV: 98.3 fL (ref 80.0–100.0)
Monocytes Absolute: 0.5 10*3/uL (ref 0.1–1.0)
Monocytes Relative: 8 %
Neutro Abs: 2.9 10*3/uL (ref 1.7–7.7)
Neutrophils Relative %: 44 %
Platelets: 256 10*3/uL (ref 150–400)
RBC: 4.02 MIL/uL (ref 3.87–5.11)
RDW: 12.3 % (ref 11.5–15.5)
WBC: 6.6 10*3/uL (ref 4.0–10.5)
nRBC: 0 % (ref 0.0–0.2)

## 2019-11-08 MED ORDER — LABETALOL HCL 5 MG/ML IV SOLN
10.0000 mg | Freq: Once | INTRAVENOUS | Status: AC
  Administered 2019-11-08: 22:00:00 10 mg via INTRAVENOUS
  Filled 2019-11-08: qty 4

## 2019-11-08 NOTE — Discharge Instructions (Signed)
Please have your daughter take you to the emergency room now as I am afraid you may be having a transient ischemic attack, acute stroke given your severely elevated blood pressure, right sided weakness and sensation of shaking.

## 2019-11-08 NOTE — ED Provider Notes (Signed)
[place]   MRN: [id number] DOB: 1935-02-12  Subjective:   Sierra Robbins is a 85 y.o. female presenting for acute onset today of redness of her right eye.  Patient also reports bizarre sensation after upper extremities on her skin and upper chest, felt like an episode of shaking but she was not.  She also feels heaviness on the right side of her face, weakness.  She has a history of hypertension, is on hydrochlorothiazide for this.  She is also on a cholesterol medication.  Denies active headache, confusion, chest pain, abdominal pain, eye pain, vision change, bloody urine.  Denies lower leg swelling, shortness of breath.  No current facility-administered medications for this encounter.  Current Outpatient Medications:  .  albuterol (PROAIR HFA) 108 (90 Base) MCG/ACT inhaler, inhale 2 puffs by mouth every 4 hours if needed for wheezing and shortness of breath, Disp: 18 g, Rfl: PRN .  amitriptyline (ELAVIL) 25 MG tablet, Take 0.5 tablets (12.5 mg total) by mouth at bedtime as needed for sleep., Disp: 45 tablet, Rfl: 1 .  amoxicillin (AMOXIL) 500 MG tablet, Take 1 tablet (500 mg total) by mouth 2 (two) times daily., Disp: 14 tablet, Rfl: 5 .  Ascorbic Acid (VITAMIN C PO), Take by mouth., Disp: , Rfl:  .  aspirin 81 MG tablet, Take 81 mg by mouth daily., Disp: , Rfl:  .  DiphenhydrAMINE HCl (BENADRYL PO), Take by mouth., Disp: , Rfl:  .  ePHEDrine-GuaiFENesin (PRIMATENE ASTHMA PO), Take by mouth., Disp: , Rfl:  .  fluticasone furoate-vilanterol (BREO ELLIPTA) 100-25 MCG/INH AEPB, Inhale 1 puff then rinse mouth, once daily, Disp: 60 each, Rfl: 12 .  hydrochlorothiazide (HYDRODIURIL) 25 MG tablet, TAKE 1 TABLET(25 MG) BY MOUTH DAILY, Disp: 90 tablet, Rfl: 3 .  liver oil-zinc oxide (DESITIN) 40 % ointment, Apply 1 application topically as needed for irritation., Disp: , Rfl:  .  Multiple Vitamin (MULTIVITAMIN) tablet, Take 1 tablet by mouth daily., Disp: , Rfl:  .  triamcinolone  (NASACORT) 55 MCG/ACT AERO nasal inhaler, Place 2 sprays into the nose daily., Disp: 1 Inhaler, Rfl: 12 .  vitamin E 400 UNIT capsule, Take 400 Units by mouth daily., Disp: , Rfl:    Allergies  Allergen Reactions  . Alendronate Sodium     REACTION: leg cramps  . Atorvastatin   . Ibandronate Sodium     REACTION: gi upset, and sluggish  . Prednisone     Mouth and tongue swelling  . Ramipril     Throat, mouth, and lip swelling  . Statins     REACTION: myalgias; weakness in legs and arms    Past Medical History:  Diagnosis Date  . Allergic rhinitis   . Asthma   . Barrett's esophagus   . COPD (chronic obstructive pulmonary disease) ([facility])   . DJD (degenerative joint disease)    hand  . Endometriosis   . GERD (gastroesophageal reflux disease)   . History of shingles    Left upper arm/axilla  . Hyperlipidemia   . Hypertension   . Osteoporosis   . Vitamin D deficiency      Past Surgical History:  Procedure Laterality Date  . ABDOMINAL HYSTERECTOMY  1979  . APPENDECTOMY    . CHOLECYSTECTOMY  1995  . NASAL SEPTUM SURGERY    . TRACHEOSTOMY     s/p temp; at 85 yo for croup  . VESICOVAGINAL FISTULA CLOSURE W/ TAH      Family History  Problem Relation Age of  Onset  . Stroke Father   . Heart attack Mother   . Asthma Sister   . Stomach cancer Maternal Grandfather   . Rheum arthritis Sister   . Rheum arthritis Sister     Social History   Tobacco Use  . Smoking status: Never Smoker  . Smokeless tobacco: Never Used  Substance Use Topics  . Alcohol use: Yes  . Drug use: No    ROS   Objective:   Vitals: BP (!) 191/86 (BP Location: Right Arm)   Pulse 79   Temp 98.2 F (36.8 C) (Oral)   Resp 17   SpO2 95%   BP Readings from Last 3 Encounters:  11/08/19 (!) 191/86  02/22/19 128/86  09/13/18 (!) 184/84   Recheck of BP was 197/79, 207/103.   Physical Exam Constitutional:      General: She is not in acute distress.    Appearance: Normal appearance. She is  well-developed. She is not ill-appearing, toxic-appearing or diaphoretic.  HENT:     Head: Normocephalic and atraumatic.     Nose: Nose normal.     Mouth/Throat:     Mouth: Mucous membranes are moist.  Eyes:     Extraocular Movements: Extraocular movements intact.     Pupils: Pupils are equal, round, and reactive to light.     Comments: Lateral subconjunctival hemorrhage of right eye.  Cardiovascular:     Rate and Rhythm: Normal rate and regular rhythm.     Pulses: Normal pulses.     Heart sounds: Normal heart sounds. No murmur. No friction rub. No gallop.   Pulmonary:     Effort: Pulmonary effort is normal. No respiratory distress.     Breath sounds: Normal breath sounds. No stridor. No wheezing, rhonchi or rales.  Musculoskeletal:     Right lower leg: No edema.     Left lower leg: No edema.  Skin:    General: Skin is warm and dry.     Findings: No rash.  Neurological:     Mental Status: She is alert and oriented to person, place, and time.     Cranial Nerves: No cranial nerve deficit.     Coordination: Coordination normal.     Gait: Gait normal.     Deep Tendon Reflexes: Reflexes normal.  Psychiatric:        Mood and Affect: Mood normal.        Behavior: Behavior normal.        Thought Content: Thought content normal.        Judgment: Judgment normal.      Assessment and Plan :   1. Right sided weakness   2. Subconjunctival hemorrhage of right eye   3. Essential hypertension   4. Elevated blood pressure reading   5. Episode of shaking   6. Hypertensive urgency     Symptoms have very concerning for possible TIA, acute stroke.  Patient needs further work-up in the emergency room including labs and head CTA.  Counseled patient on management of nonemergent subconjunctival hemorrhage.  Spoke with her daughter as well, contracts for safety and will report to the emergency room now for further work-up of possible stroke, TIA.   Wallis Bamberg, PA-C 11/08/19 2006

## 2019-11-08 NOTE — ED Notes (Signed)
Patient is being discharged from the Urgent Care Center and sent to the Emergency Department via personal vehicle by daughter. Per provider Cam Hai, patient is stable but in need of higher level of care due to right sided weakness & hypertensive urgency Patient is aware and verbalizes understanding of plan of care.  Vitals:   11/08/19 1931  BP: (!) 191/86  Pulse: 79  Resp: 17  Temp: 98.2 F (36.8 C)  SpO2: 95%

## 2019-11-08 NOTE — ED Triage Notes (Signed)
Pt presents with blood spot on corner on right eye since waking up this morning.

## 2019-11-08 NOTE — ED Notes (Signed)
Pt attempted to call daughter to update, no response, mailbox full

## 2019-11-08 NOTE — ED Provider Notes (Signed)
[facility] EMERGENCY DEPARTMENT Provider Note   CSN: [id number] Arrival date & time: 11/08/19  2008     History Chief Complaint  Patient presents with  . Needs CT scan / Hypertension / Conjunctival Hemorrhage    Sierra Robbins is a 85 y.o. female.  HPI Patient is an 85 year old female with a PMH of hyperlipidemia, hypertension, asthma and GERD presenting to the ED today due to hypertension and subconjunctival hemorrhage.  Patient reports that this morning, her daughter noticed a red spot on her eye.  Patient had a brief episode of right-sided temporal and facial "heaviness" earlier in the morning that she thinks may be associated with the red spot.  She also says that her upper extremities felt "trembly" although she denies any weakness.  She went to urgent care for evaluation where her systolic BP was noted to be in the 200s.  She was subsequently transferred to [facility] ED for further evaluation and management.  Patient says that she takes HCTZ for hypertension and has not missed any doses.  She denies headache, chest pain, shortness of breath, lightheadedness or weakness.    Past Medical History:  Diagnosis Date  . Allergic rhinitis   . Asthma   . Barrett's esophagus   . COPD (chronic obstructive pulmonary disease) ([place])   . DJD (degenerative joint disease)    hand  . Endometriosis   . GERD (gastroesophageal reflux disease)   . History of shingles    Left upper arm/axilla  . Hyperlipidemia   . Hypertension   . Osteoporosis   . Vitamin D deficiency     Patient Active Problem List   Diagnosis Date Noted  . Microhematuria 12/13/2017  . Trigger finger, acquired 12/03/2014  . Medicare annual wellness visit, subsequent 12/03/2014  . Preventative health care 10/01/2011  . Vitamin D deficiency 09/21/2009  . POSTHERPETIC NEURALGIA 11/20/2008  . ANEMIA, MACROCYTIC 09/16/2008  . Hyperlipidemia 09/03/2007  . Essential hypertension 09/03/2007  . Seasonal  and perennial allergic rhinitis 09/03/2007  . Asthma, mild persistent 09/03/2007  . GERD 09/03/2007  . BARRETTS ESOPHAGUS 09/03/2007  . Osteoporosis 09/03/2007    Past Surgical History:  Procedure Laterality Date  . ABDOMINAL HYSTERECTOMY  1979  . APPENDECTOMY    . CHOLECYSTECTOMY  1995  . NASAL SEPTUM SURGERY    . TRACHEOSTOMY     s/p temp; at 85 yo for croup  . VESICOVAGINAL FISTULA CLOSURE W/ TAH       OB History   No obstetric history on file.     Family History  Problem Relation Age of Onset  . Stroke Father   . Heart attack Mother   . Asthma Sister   . Stomach cancer Maternal Grandfather   . Rheum arthritis Sister   . Rheum arthritis Sister     Social History   Tobacco Use  . Smoking status: Never Smoker  . Smokeless tobacco: Never Used  Substance Use Topics  . Alcohol use: Yes  . Drug use: No    Home Medications Prior to Admission medications   Medication Sig Start Date End Date Taking? Authorizing Provider  albuterol (PROAIR HFA) 108 (90 Base) MCG/ACT inhaler inhale 2 puffs by mouth every 4 hours if needed for wheezing and shortness of breath 09/23/19   Baird Lyons D, MD  amitriptyline (ELAVIL) 25 MG tablet Take 0.5 tablets (12.5 mg total) by mouth at bedtime as needed for sleep. 02/22/19   Biagio Borg, MD  amoxicillin (AMOXIL) 500 MG  tablet Take 1 tablet (500 mg total) by mouth 2 (two) times daily. 09/23/19   Jetty Duhamel D, MD  Ascorbic Acid (VITAMIN C PO) Take by mouth.    [provider]  aspirin 81 MG tablet Take 81 mg by mouth daily.    [provider]  DiphenhydrAMINE HCl (BENADRYL PO) Take by mouth.    [provider]  ePHEDrine-GuaiFENesin (PRIMATENE ASTHMA PO) Take by mouth.    [provider]  fluticasone furoate-vilanterol (BREO ELLIPTA) 100-25 MCG/INH AEPB Inhale 1 puff then rinse mouth, once daily 09/13/18   Jetty Duhamel D, MD  hydrochlorothiazide (HYDRODIURIL) 25 MG tablet TAKE 1 TABLET(25 MG) BY  MOUTH DAILY 02/22/19   Corwin Levins, MD  liver oil-zinc oxide (DESITIN) 40 % ointment Apply 1 application topically as needed for irritation.    [provider]  Multiple Vitamin (MULTIVITAMIN) tablet Take 1 tablet by mouth daily.    [provider]  triamcinolone (NASACORT) 55 MCG/ACT AERO nasal inhaler Place 2 sprays into the nose daily. 09/13/17   Jetty Duhamel D, MD  vitamin E 400 UNIT capsule Take 400 Units by mouth daily.    [provider]    Allergies    Alendronate sodium, Atorvastatin, Ibandronate sodium, Prednisone, Ramipril, and Statins  Review of Systems   Review of Systems  Constitutional: Negative for chills and fever.  HENT: Negative for congestion, rhinorrhea and sore throat.   Eyes: Positive for redness. Negative for photophobia, pain and visual disturbance.  Respiratory: Negative for cough and shortness of breath.   Cardiovascular: Negative for chest pain, palpitations and leg swelling.  Gastrointestinal: Negative for abdominal pain, diarrhea, nausea and vomiting.  Genitourinary: Negative for dysuria and hematuria.  Musculoskeletal: Negative for arthralgias and back pain.  Skin: Negative for rash and wound.  Neurological: Negative for seizures, syncope, light-headedness and headaches.  Psychiatric/Behavioral: Negative for agitation.  All other systems reviewed and are negative.   Physical Exam Updated Vital Signs BP (!) 202/99 (BP Location: Left Arm)   Pulse 83   Temp 98.2 F (36.8 C) (Oral)   Resp 18   Ht 5\' 1"  (1.549 m)   Wt 70 kg   SpO2 98%   BMI 29.16 kg/m   Physical Exam Vitals and nursing note reviewed.  Constitutional:      General: She is not in acute distress.    Appearance: Normal appearance. She is well-developed. She is not ill-appearing, toxic-appearing or diaphoretic.  HENT:     Head: Normocephalic and atraumatic.     Right Ear: External ear normal.     Left Ear: External ear normal.     Nose: Nose normal. No  congestion or rhinorrhea.     Mouth/Throat:     Mouth: Mucous membranes are moist.     Pharynx: Oropharynx is clear.  Eyes:     Extraocular Movements: Extraocular movements intact.     Pupils: Pupils are equal, round, and reactive to light.     Comments: Subconjunctival hemorrhage from 6 o'clock to 9 o'clock position on right eye.  No chemosis noted.  Full extraocular movements.  No decrease in visual acuity or proptosis.  Cardiovascular:     Rate and Rhythm: Normal rate and regular rhythm.     Pulses: Normal pulses.  Pulmonary:     Effort: Pulmonary effort is normal. No respiratory distress.     Breath sounds: Normal breath sounds. No stridor. No wheezing, rhonchi or rales.  Abdominal:     Palpations: Abdomen is  soft.     Tenderness: There is no abdominal tenderness. There is no guarding or rebound.  Musculoskeletal:        General: Normal range of motion.     Cervical back: Normal range of motion and neck supple.     Right lower leg: No edema.     Left lower leg: No edema.  Skin:    General: Skin is warm and dry.     Capillary Refill: Capillary refill takes less than 2 seconds.  Neurological:     General: No focal deficit present.     Mental Status: She is alert and oriented to person, place, and time. Mental status is at baseline.     Cranial Nerves: No cranial nerve deficit.     Sensory: No sensory deficit.     Motor: No weakness.     Coordination: Coordination normal.     Gait: Gait normal.  Psychiatric:        Mood and Affect: Mood normal.     ED Results / Procedures / Treatments   Labs (all labs ordered are listed, but only abnormal results are displayed) Labs Reviewed  CBC WITH DIFFERENTIAL/PLATELET - Abnormal; Notable for the following components:      Result Value   MCH 34.6 (*)    All other components within normal limits  BASIC METABOLIC PANEL - Abnormal; Notable for the following components:   Glucose, Bld 104 (*)    All other components within normal  limits    EKG EKG Interpretation  Date/Time:  Friday November 08 2019 20:18:15 EST Ventricular Rate:  73 PR Interval:  142 QRS Duration: 78 QT Interval:  386 QTC Calculation: 425 R Axis:   56 Text Interpretation: Sinus rhythm with Premature supraventricular complexes Possible Left atrial enlargement Borderline ECG No STEMI Confirmed by Alona Bene [phone]) on 11/08/2019 8:35:55 PM   Radiology No results found.  Procedures Procedures (including critical care time)  Medications Ordered in ED Medications  labetalol (NORMODYNE) injection 10 mg (10 mg Intravenous Given 11/08/19 2211)    ED Course  I have reviewed the triage vital signs and the nursing notes.  Pertinent labs & imaging results that were available during my care of the patient were reviewed by me and considered in my medical decision making (see chart for details).    MDM Rules/Calculators/A&P                     Patient is an 85 year old female with a PMH of hyperlipidemia, hypertension, asthma and GERD presenting to the ED today due to hypertension and subconjunctival hemorrhage.  On exam, patient has a subconjunctival hemorrhage from the 6 to 9 o'clock position of the right eye.  BP 191/86, HR 79, RR 17, SPO2 95% on room air.  Afebrile.  On arrival, patient appears generally well and is displaying no signs of acute distress.  While she is hypertensive, she denies headache, chest pain or shortness of breath.  She takes HCTZ for hypertension and denies missing any doses.  EKG shows sinus rhythm with PVCs but no signs of acute ischemia.  CBC unremarkable.  CMP also unremarkable with no signs of kidney dysfunction.  Patient does have subconjunctival hemorrhage of the right eye.  However, this was not in the setting of trauma and there is low concern for globe rupture traumatic iritis.  She denies pain associated with the eye or change in visual acuity.  Although patient is markedly hypertensive, she has no signs of  endorgan  damage and there is low concern for hypertensive emergency.  Her brief episode of right facial heaviness and bilateral upper extremity trembling not concerning for stroke.  She has no current neuro exam findings.  Patient given 10 mg labetalol with mild improvement in her SBP to the 170s.  At this time, patient appears stable for discharge.  Discussed that subconjunctival hemorrhage would likely improve on its own without intervention.  Strongly encouraged patient to arrange follow-up with her PCP as soon as possible for medication optimization.  No further work-up or intervention required while in the ED.  Patient discharged home.  Provided strict return precautions including headache, chest pain or shortness of breath.  Patient in agreement with plan.  Patient stable at time of discharge.  Patient assessed immediately with Dr. Jacqulyn Bath.  Delray Alt, MD   Final Clinical Impression(s) / ED Diagnoses Final diagnoses:  Hypertension, unspecified type    Rx / DC Orders ED Discharge Orders    None       Delray Alt, MD 11/09/19 9892    Maia Plan, MD 11/09/19 1001

## 2019-11-08 NOTE — ED Triage Notes (Addendum)
Patient transferred from [facility] urgent care , diagnosed with subconjunctival hemorrhage sent here for CT scan , hypertensive at arrival BP = 202/99.

## 2020-01-01 ENCOUNTER — Telehealth: Payer: Self-pay

## 2020-01-01 NOTE — Telephone Encounter (Signed)
New message   The patient calling has questions regarding COVID vaccine shot Pfizer.   Asking for a call back today.   [facility] appt tomorrow morning for the first shot

## 2020-01-03 NOTE — Telephone Encounter (Signed)
Pt contacted. Pt stated that she had her vaccine yesterday and wanted to speak to [place] before then. She wanted to make sure that it was okay for her to get the vaccine. I reassured her that there were no allergies listed that would give concern (Per PCP).   [facility] will send [place] the immunization record.

## 2020-02-04 ENCOUNTER — Telehealth: Payer: Self-pay

## 2020-02-04 NOTE — Telephone Encounter (Signed)
Key: [id number]

## 2020-02-07 NOTE — Telephone Encounter (Signed)
Prior auth for the amitriptyline (ama-trip-ty-lein) has been approved.   Please inform pt of same and have her call the pharmacy to fill.

## 2020-02-07 NOTE — Telephone Encounter (Signed)
Called and informed patient about prior auth.

## 2020-02-25 ENCOUNTER — Ambulatory Visit (INDEPENDENT_AMBULATORY_CARE_PROVIDER_SITE_OTHER): Payer: Medicare Other

## 2020-02-25 ENCOUNTER — Encounter: Payer: Self-pay | Admitting: Internal Medicine

## 2020-02-25 ENCOUNTER — Ambulatory Visit (INDEPENDENT_AMBULATORY_CARE_PROVIDER_SITE_OTHER): Payer: Medicare Other | Admitting: Internal Medicine

## 2020-02-25 ENCOUNTER — Other Ambulatory Visit: Payer: Self-pay

## 2020-02-25 VITALS — BP 170/76 | HR 76 | Temp 98.6°F | Ht 61.0 in | Wt 148.0 lb

## 2020-02-25 VITALS — BP 170/80 | HR 72 | Temp 98.4°F | Ht 61.0 in | Wt 148.4 lb

## 2020-02-25 DIAGNOSIS — Z0000 Encounter for general adult medical examination without abnormal findings: Secondary | ICD-10-CM | POA: Diagnosis not present

## 2020-02-25 DIAGNOSIS — I10 Essential (primary) hypertension: Secondary | ICD-10-CM | POA: Diagnosis not present

## 2020-02-25 DIAGNOSIS — J4530 Mild persistent asthma, uncomplicated: Secondary | ICD-10-CM | POA: Diagnosis not present

## 2020-02-25 DIAGNOSIS — E559 Vitamin D deficiency, unspecified: Secondary | ICD-10-CM | POA: Diagnosis not present

## 2020-02-25 DIAGNOSIS — T783XXA Angioneurotic edema, initial encounter: Secondary | ICD-10-CM | POA: Diagnosis not present

## 2020-02-25 DIAGNOSIS — K219 Gastro-esophageal reflux disease without esophagitis: Secondary | ICD-10-CM

## 2020-02-25 MED ORDER — METHYLPREDNISOLONE 4 MG PO TBPK: ORAL_TABLET | ORAL | 0 refills | Status: DC

## 2020-02-25 MED ORDER — LABETALOL HCL 200 MG PO TABS: 200.0000 mg | ORAL_TABLET | Freq: Two times a day (BID) | ORAL | 11 refills | Status: DC

## 2020-02-25 NOTE — Assessment & Plan Note (Signed)
Cont oral repalcement 

## 2020-02-25 NOTE — Assessment & Plan Note (Signed)
stable overall by history and exam, recent data reviewed with pt, and pt to continue medical treatment as before,  to f/u any worsening symptoms or concerns  

## 2020-02-25 NOTE — Assessment & Plan Note (Addendum)
Mild s/p yellow jacket, for medrol pack  I spent 31 minutes in preparing to see the patient by review of recent labs, imaging and procedures, obtaining and reviewing separately obtained history, communicating with the patient and family or caregiver, ordering medications, tests or procedures, and documenting clinical information in the EHR including the differential Dx, treatment, and any further evaluation and other management of  Angioedema, vit d def, htn, gerd, asthma

## 2020-02-25 NOTE — Patient Instructions (Signed)
Please take all new medication as prescribed - the new blood pressure medication, and the steroid pill (NOT prednisone)  Please continue all other medications as before, and refills have been done if requested.  Please have the pharmacy call with any other refills you may need.  Please continue your efforts at being more active, low cholesterol diet, and weight control.  Please keep your appointments with your specialists as you may have planned  Please make an Appointment to return in 3 months for blood pressure

## 2020-02-25 NOTE — Patient Instructions (Signed)
Sierra Robbins , Thank you for taking time to come for your Medicare Wellness Visit. I appreciate your ongoing commitment to your health goals. Please review the following plan we discussed and let me know if I can assist you in the future.   Screening recommendations/referrals: Colonoscopy: no repeat due to age Mammogram: 12/01/2014 Bone Density: 10/18/2010 Recommended yearly ophthalmology/optometry visit for glaucoma screening and checkup Recommended yearly dental visit for hygiene and checkup  Vaccinations: Influenza vaccine: 06/13/2019; due every year Pneumococcal vaccine: completed Tdap vaccine: 02/22/2019; due every 10 years Shingles vaccine: never done   Covid-19: completed Pfizer  Advanced directives: Please bring a copy of your health care power of attorney and living will to the office at your convenience.  Conditions/risks identified: Please continue to do your personal lifestyle choices by: daily care of teeth and gums, regular physical activity (goal should be 5 days a week for 30 minutes), eat a healthy diet, avoid tobacco and drug use, limiting any alcohol intake, taking a low-dose aspirin (if not allergic or have been advised by your provider otherwise) and taking vitamins and minerals as recommended by your provider. Continue doing brain stimulating activities (puzzles, reading, adult coloring books, staying active) to keep memory sharp. Continue to eat heart healthy diet (full of fruits, vegetables, whole grains, lean protein, water--limit salt, fat, and sugar intake) and increase physical activity as tolerated.  Next appointment: Please schedule your next Medicare Wellness Visit with your Nurse Health Advisor in 1 year.   Preventive Care 18 Years and Older, Female Preventive care refers to lifestyle choices and visits with your health care provider that can promote health and wellness. What does preventive care include?  A yearly physical exam. This is also called an annual  well check.  Dental exams once or twice a year.  Routine eye exams. Ask your health care provider how often you should have your eyes checked.  Personal lifestyle choices, including:  Daily care of your teeth and gums.  Regular physical activity.  Eating a healthy diet.  Avoiding tobacco and drug use.  Limiting alcohol use.  Practicing safe sex.  Taking low-dose aspirin every day.  Taking vitamin and mineral supplements as recommended by your health care provider. What happens during an annual well check? The services and screenings done by your health care provider during your annual well check will depend on your age, overall health, lifestyle risk factors, and family history of disease. Counseling  Your health care provider may ask you questions about your:  Alcohol use.  Tobacco use.  Drug use.  Emotional well-being.  Home and relationship well-being.  Sexual activity.  Eating habits.  History of falls.  Memory and ability to understand (cognition).  Work and work Astronomer.  Reproductive health. Screening  You may have the following tests or measurements:  Height, weight, and BMI.  Blood pressure.  Lipid and cholesterol levels. These may be checked every 5 years, or more frequently if you are over 44 years old.  Skin check.  Lung cancer screening. You may have this screening every year starting at age 2 if you have a 30-pack-year history of smoking and currently smoke or have quit within the past 15 years.  Fecal occult blood test (FOBT) of the stool. You may have this test every year starting at age 26.  Flexible sigmoidoscopy or colonoscopy. You may have a sigmoidoscopy every 5 years or a colonoscopy every 10 years starting at age 49.  Hepatitis C blood test.  Hepatitis B  blood test.  Sexually transmitted disease (STD) testing.  Diabetes screening. This is done by checking your blood sugar (glucose) after you have not eaten for a while  (fasting). You may have this done every 1-3 years.  Bone density scan. This is done to screen for osteoporosis. You may have this done starting at age 60.  Mammogram. This may be done every 1-2 years. Talk to your health care provider about how often you should have regular mammograms. Talk with your health care provider about your test results, treatment options, and if necessary, the need for more tests. Vaccines  Your health care provider may recommend certain vaccines, such as:  Influenza vaccine. This is recommended every year.  Tetanus, diphtheria, and acellular pertussis (Tdap, Td) vaccine. You may need a Td booster every 10 years.  Zoster vaccine. You may need this after age 1.  Pneumococcal 13-valent conjugate (PCV13) vaccine. One dose is recommended after age 38.  Pneumococcal polysaccharide (PPSV23) vaccine. One dose is recommended after age 53. Talk to your health care provider about which screenings and vaccines you need and how often you need them. This information is not intended to replace advice given to you by your health care provider. Make sure you discuss any questions you have with your health care provider. Document Released: 09/11/2015 Document Revised: 05/04/2016 Document Reviewed: 06/16/2015 Elsevier Interactive Patient Education  2017 [employer] Prevention in the Home Falls can cause injuries. They can happen to people of all ages. There are many things you can do to make your home safe and to help prevent falls. What can I do on the outside of my home?  Regularly fix the edges of walkways and driveways and fix any cracks.  Remove anything that might make you trip as you walk through a door, such as a raised step or threshold.  Trim any bushes or trees on the path to your home.  Use bright outdoor lighting.  Clear any walking paths of anything that might make someone trip, such as rocks or tools.  Regularly check to see if handrails are loose  or broken. Make sure that both sides of any steps have handrails.  Any raised decks and porches should have guardrails on the edges.  Have any leaves, snow, or ice cleared regularly.  Use sand or salt on walking paths during winter.  Clean up any spills in your garage right away. This includes oil or grease spills. What can I do in the bathroom?  Use night lights.  Install grab bars by the toilet and in the tub and shower. Do not use towel bars as grab bars.  Use non-skid mats or decals in the tub or shower.  If you need to sit down in the shower, use a plastic, non-slip stool.  Keep the floor dry. Clean up any water that spills on the floor as soon as it happens.  Remove soap buildup in the tub or shower regularly.  Attach bath mats securely with double-sided non-slip rug tape.  Do not have throw rugs and other things on the floor that can make you trip. What can I do in the bedroom?  Use night lights.  Make sure that you have a light by your bed that is easy to reach.  Do not use any sheets or blankets that are too big for your bed. They should not hang down onto the floor.  Have a firm chair that has side arms. You can use this for support  while you get dressed.  Do not have throw rugs and other things on the floor that can make you trip. What can I do in the kitchen?  Clean up any spills right away.  Avoid walking on wet floors.  Keep items that you use a lot in easy-to-reach places.  If you need to reach something above you, use a strong step stool that has a grab bar.  Keep electrical cords out of the way.  Do not use floor polish or wax that makes floors slippery. If you must use wax, use non-skid floor wax.  Do not have throw rugs and other things on the floor that can make you trip. What can I do with my stairs?  Do not leave any items on the stairs.  Make sure that there are handrails on both sides of the stairs and use them. Fix handrails that are  broken or loose. Make sure that handrails are as long as the stairways.  Check any carpeting to make sure that it is firmly attached to the stairs. Fix any carpet that is loose or worn.  Avoid having throw rugs at the top or bottom of the stairs. If you do have throw rugs, attach them to the floor with carpet tape.  Make sure that you have a light switch at the top of the stairs and the bottom of the stairs. If you do not have them, ask someone to add them for you. What else can I do to help prevent falls?  Wear shoes that:  Do not have high heels.  Have rubber bottoms.  Are comfortable and fit you well.  Are closed at the toe. Do not wear sandals.  If you use a stepladder:  Make sure that it is fully opened. Do not climb a closed stepladder.  Make sure that both sides of the stepladder are locked into place.  Ask someone to hold it for you, if possible.  Clearly mark and make sure that you can see:  Any grab bars or handrails.  First and last steps.  Where the edge of each step is.  Use tools that help you move around (mobility aids) if they are needed. These include:  Canes.  Walkers.  Scooters.  Crutches.  Turn on the lights when you go into a dark area. Replace any light bulbs as soon as they burn out.  Set up your furniture so you have a clear path. Avoid moving your furniture around.  If any of your floors are uneven, fix them.  If there are any pets around you, be aware of where they are.  Review your medicines with your doctor. Some medicines can make you feel dizzy. This can increase your chance of falling. Ask your doctor what other things that you can do to help prevent falls. This information is not intended to replace advice given to you by your health care provider. Make sure you discuss any questions you have with your health care provider. Document Released: 06/11/2009 Document Revised: 01/21/2016 Document Reviewed: 09/19/2014 Elsevier  Interactive Patient Education  2017 [employer].

## 2020-02-25 NOTE — Progress Notes (Signed)
Subjective:   Sierra Robbins is a 85 y.o. female who presents for Medicare Annual (Subsequent) preventive examination.  Review of Systems    No ROS. Medicare Wellness Visit Cardiac Risk Factors include: advanced age (>[id number], 68>65 women);family history of premature cardiovascular disease;hypertension     Objective:    Today's Vitals   02/25/20 0923 02/25/20 0934  BP: (!) 170/80   Pulse: 72   Temp: 98.4 F (36.9 C)   SpO2: 94%   Weight: 148 lb 6.4 oz (67.3 kg)   Height: 5\' 1"  (1.549 m)   PainSc: 5  5   PainLoc: Knee    Body mass index is 28.04 kg/m.  Advanced Directives 02/25/2020 11/08/2019  Does Patient Have a Medical Advance Directive? Yes No  Type of Estate agentAdvance Directive Healthcare Power of [place];Living will -  Does patient want to make changes to medical advance directive? No - Patient declined -  Copy of Healthcare Power of Attorney in Chart? No - copy requested -  Would patient like information on creating a medical advance directive? - No - Patient declined    Current Medications (verified) Outpatient Encounter Medications as of 02/25/2020  Medication Sig  . acetaminophen (TYLENOL) 325 MG tablet Take 162.5 mg by mouth every 6 (six) hours as needed for headache (pain).  Marland Kitchen. albuterol (PROAIR HFA) 108 (90 Base) MCG/ACT inhaler inhale 2 puffs by mouth every 4 hours if needed for wheezing and shortness of breath (Patient taking differently: Inhale 2 puffs into the lungs every 4 (four) hours as needed for wheezing or shortness of breath. )  . amitriptyline (ELAVIL) 25 MG tablet Take 0.5 tablets (12.5 mg total) by mouth at bedtime as needed for sleep. (Patient taking differently: Take 12 mg by mouth at bedtime as needed (pain/itching/anxiety from shingles). )  . Ascorbic Acid (VITAMIN C PO) Take 0.5 mg by mouth See admin instructions. Take 1/2 tablet by mouth three to four times monthly.  Marland Kitchen. aspirin EC 81 MG tablet Take 81 mg by mouth daily with breakfast.  . Cod Liver Oil  1000 MG CAPS Take 1,000 mg by mouth daily with breakfast.  . diphenhydrAMINE (BENADRYL) 25 MG tablet Take 12.5 mg by mouth every 6 (six) hours as needed for itching.  . fluticasone (FLONASE) 50 MCG/ACT nasal spray Place 1 spray into both nostrils daily as needed for allergies or rhinitis.  . fluticasone furoate-vilanterol (BREO ELLIPTA) 100-25 MCG/INH AEPB Inhale 1 puff then rinse mouth, once daily (Patient not taking: Reported on 11/08/2019)  . hydrochlorothiazide (HYDRODIURIL) 25 MG tablet TAKE 1 TABLET(25 MG) BY MOUTH DAILY (Patient taking differently: Take 25 mg by mouth daily with breakfast. )  . hydrocortisone cream 1 % Apply 1 application topically 2 (two) times daily as needed for itching.  Marland Kitchen. ibuprofen (ADVIL) 200 MG tablet Take 200 mg by mouth every 6 (six) hours as needed for headache (pain).  . Multiple Vitamin (MULTIVITAMIN WITH MINERALS) TABS tablet Take 1 tablet by mouth daily with breakfast. Centrum A-Z  . triamcinolone (NASACORT) 55 MCG/ACT AERO nasal inhaler Place 2 sprays into the nose daily. (Patient not taking: Reported on 11/08/2019)  . vitamin E 400 UNIT capsule Take 400 Units by mouth daily with breakfast.    No facility-administered encounter medications on file as of 02/25/2020.    Allergies (verified) Alendronate sodium, Atorvastatin, Ibandronate sodium, Prednisone, Ramipril, and Statins   History: Past Medical History:  Diagnosis Date  . Allergic rhinitis   . Asthma   . Barrett's esophagus   .  COPD (chronic obstructive pulmonary disease) (HCC)   . DJD (degenerative joint disease)    hand  . Endometriosis   . GERD (gastroesophageal reflux disease)   . History of shingles    Left upper arm/axilla  . Hyperlipidemia   . Hypertension   . Osteoporosis   . Vitamin D deficiency    Past Surgical History:  Procedure Laterality Date  . ABDOMINAL HYSTERECTOMY  1979  . APPENDECTOMY    . CHOLECYSTECTOMY  1995  . NASAL SEPTUM SURGERY    . TRACHEOSTOMY     s/p temp;  at 85 yo for croup  . VESICOVAGINAL FISTULA CLOSURE W/ TAH     Family History  Problem Relation Age of Onset  . Stroke Father   . Heart attack Mother   . Asthma Sister   . Stomach cancer Maternal Grandfather   . Rheum arthritis Sister   . Rheum arthritis Sister    Social History   Socioeconomic History  . Marital status: Divorced    Spouse name: Not on file  . Number of children: 1  . Years of education: Not on file  . Highest education level: Not on file  Occupational History  . 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 Smoking status: Never Smoker  . Smokeless tobacco: Never Used  Substance and Sexual Activity  . Alcohol use: Yes    Comment: during the holidays  . Drug use: No  . Sexual activity: Not Currently  Other Topics Concern  . Not on file  Social History Narrative  . Not on file   Social Determinants of Health   Financial Resource Strain: Low Risk   . Difficulty of Paying Living Expenses: Not hard at all  Food Insecurity: No Food Insecurity  . Worried About Programme researcher, broadcasting/film/video in the Last Year: Never true  . Ran Out of Food in the Last Year: Never true  Transportation Needs: No Transportation Needs  . Lack of Transportation (Medical): No  . Lack of Transportation (Non-Medical): No  Physical Activity: Sufficiently Active  . Days of Exercise per Week: 5 days  . Minutes of Exercise per Session: 30 min  Stress: No Stress Concern Present  . Feeling of Stress : Not at all  Social Connections: Moderately Integrated  . Frequency of Communication with Friends and Family: More than three times a week  . Frequency of Social Gatherings with Friends and Family: More than three times a week  . Attends Religious Services: More than 4 times per year  . Active Member of Clubs or Organizations: Yes  . Attends Banker Meetings: More than 4 times per year  . Marital Status: Divorced    Tobacco Counseling Counseling  given: No   Clinical Intake:  Pre-visit preparation completed: Yes  Pain : 0-10 Pain Score: 5  Pain Type: Acute pain Pain Location: Knee Pain Orientation: Left Pain Descriptors / Indicators: Aching Pain Onset: Yesterday Pain Frequency: Intermittent Pain Relieving Factors: ice/heat and Advil  Pain Relieving Factors: ice/heat and Advil  BMI - recorded: 28.04 Nutritional Status: BMI 25 -29 Overweight Nutritional Risks: Nausea/ vomitting/ diarrhea Diabetes: No  How often do you need to have someone help you when you read instructions, pamphlets, or other written materials from your doctor or pharmacy?: 1 - Never What is the last grade level you completed in school?: High School Graduate  Diabetic? no  Interpreter Needed?: No  Information entered by :: Monnica Saltsman N. Cohoe,  LPN   Activities of Daily Living In your present state of health, do you have any difficulty performing the following activities: 02/25/2020  Hearing? N  Vision? N  Difficulty concentrating or making decisions? N  Walking or climbing stairs? N  Dressing or bathing? N  Doing errands, shopping? N  Preparing Food and eating ? N  Using the Toilet? N  In the past six months, have you accidently leaked urine? Y  Comment Wears a pad for protection  Do you have problems with loss of bowel control? N  Managing your Medications? N  Managing your Finances? N  Housekeeping or managing your Housekeeping? N  Some recent data might be hidden    Patient Care Team: Corwin Levins, MD as PCP - General  Indicate any recent Medical Services you may have received from other than [facility] providers in the past year (date may be approximate).     Assessment:   This is a routine wellness examination for [facility].  Hearing/Vision screen No exam data present  Dietary issues and exercise activities discussed: Current Exercise Habits: Home exercise routine, Type of exercise: walking (Yard work and gardening), Time (Minutes):  30, Frequency (Times/Week): 5, Weekly Exercise (Minutes/Week): 150, Intensity: Moderate, Exercise limited by: respiratory conditions(s)  Goals    .  Client understands the importance of follow-up with providers by attending scheduled visits (pt-stated)      To continue to stay alive and well.      Depression Screen PHQ 2/9 Scores 02/25/2020 02/22/2019 12/13/2017 12/07/2016 12/04/2015 12/04/2015 12/03/2014  PHQ - 2 Score 0 0 0 0 0 0 0    Fall Risk Fall Risk  02/25/2020 02/22/2019 12/13/2017 12/07/2016 12/04/2015  Falls in the past year? 0 0 No No No  Number falls in past yr: 0 - - - -  Injury with Fall? 0 - - - -  Risk for fall due to : No Fall Risks - - - -  Follow up Falls evaluation completed;Education provided - - - -    Any stairs in or around the home? Yes  If so, are there any without handrails? No  Home free of loose throw rugs in walkways, pet beds, electrical cords, etc? yes Adequate lighting in your home to reduce risk of falls? Yes   ASSISTIVE DEVICES UTILIZED TO PREVENT FALLS:  Life alert? No  Use of a cane, walker or w/c? No  Grab bars in the bathroom? Yes  Shower chair or bench in shower? No  Elevated toilet seat or a handicapped toilet? No   TIMED UP AND GO:  Was the test performed? No .  Length of time to ambulate 10 feet: 0 sec.   Gait steady and fast without use of assistive device  Cognitive Function: MMSE - Mini Mental State Exam 02/25/2020  Not completed: Refused        Immunizations Immunization History  Administered Date(s) Administered  . Fluad Quad(high Dose 65+) 06/13/2019  . H1N1 07/29/2008  . Influenza Split 05/29/2012, 05/25/2013, 05/29/2014  . Influenza Whole 06/08/2007, 04/29/2008, 06/29/2010, 05/30/2011  . Influenza, High Dose Seasonal PF 05/12/2016, 05/25/2017, 05/28/2018  . Influenza-Unspecified 05/24/2015  . PFIZER SARS-COV-2 Vaccination 01/02/2020  . Pneumococcal Conjugate-13 11/27/2013  . Pneumococcal Polysaccharide-23 07/01/2002,  09/16/2008  . Td 09/16/2008  . Tdap 02/22/2019    TDAP status: Up to date Flu Vaccine status: Up to date Pneumococcal vaccine status: Up to date Covid-19 vaccine status: Completed vaccines  Qualifies for Shingles Vaccine? Yes   Zostavax completed  No   Shingrix Completed?: No.    Education has been provided regarding the importance of this vaccine. Patient has been advised to call insurance company to determine out of pocket expense if they have not yet received this vaccine. Advised may also receive vaccine at local pharmacy or Health Dept. Verbalized acceptance and understanding.  Screening Tests Health Maintenance  Topic Date Due  . COVID-19 Vaccine (2 - Pfizer 2-dose series) 01/23/2020  . INFLUENZA VACCINE  03/29/2020  . TETANUS/TDAP  02/21/2029  . DEXA SCAN  Completed  . PNA vac Low Risk Adult  Completed    Health Maintenance  Health Maintenance Due  Topic Date Due  . COVID-19 Vaccine (2 - Pfizer 2-dose series) 01/23/2020    Colorectal cancer screening: No longer required.  Mammogram status: No longer required.  Bone Density status: Completed 10/18/2010. Results reflect: Bone density results: OSTEOPOROSIS. Repeat every 2 years.  Lung Cancer Screening: (Low Dose CT Chest recommended if Age 76-80 years, 30 pack-year currently smoking OR have quit w/in 15years.) does not qualify.   Lung Cancer Screening Referral: no  Additional Screening:  Hepatitis C Screening: does not qualify; Completed no  Vision Screening: Recommended annual ophthalmology exams for early detection of glaucoma and other disorders of the eye. Is the patient up to date with their annual eye exam?  Yes  Who is the provider or what is the name of the office in which the patient attends annual eye exams? Janet Berlin, MD If pt is not established with a provider, would they like to be referred to a provider to establish care? No .   Dental Screening: Recommended annual dental exams for proper oral  hygiene  Community Resource Referral / Chronic Care Management: CRR required this visit?  No   CCM required this visit?  No      Plan:     I have personally reviewed and noted the following in the patient's chart:   . Medical and social history . Use of alcohol, tobacco or illicit drugs  . Current medications and supplements . Functional ability and status . Nutritional status . Physical activity . Advanced directives . List of other physicians . Hospitalizations, surgeries, and ER visits in previous 12 months . Vitals . Screenings to include cognitive, depression, and falls . Referrals and appointments  In addition, I have reviewed and discussed with patient certain preventive protocols, quality metrics, and best practice recommendations. A written personalized care plan for preventive services as well as general preventive health recommendations were provided to patient.     Mickeal Needy, LPN   7/51/0258   Nurse Notes: Patient is cogitatively intact.

## 2020-02-25 NOTE — Progress Notes (Signed)
Subjective:    Patient ID: Sierra Robbins, female    DOB: 07-25-1935, 85 y.o.   MRN: [id number]  HPI  Here for yearly f/u;  Overall doing ok;  Pt denies Chest pain, worsening SOB, DOE, wheezing, orthopnea, PND, worsening LE edema, palpitations, dizziness or syncope.  Pt denies neurological change such as new headache, facial or extremity weakness.  Pt denies polydipsia, polyuria, or low sugar symptoms. Pt states overall good compliance with treatment and medications, good tolerability, and has been trying to follow appropriate diet.  Pt denies worsening depressive symptoms, suicidal ideation or panic. No fever, night sweats, wt loss, loss of appetite, or other constitutional symptoms.  Pt states good ability with ADL's, has low fall risk, home safety reviewed and adequate, no other significant changes in hearing or vision, and occasionally active with exercise.  Bp remains elevated, now willing to try further med tx   Does have markedly itchyt red swelling to mid abd after yellow jacket attack BP Readings from Last 3 Encounters:  02/25/20 (!) 170/76  02/25/20 (!) 170/80  11/08/19 (!) 171/77   Wt Readings from Last 3 Encounters:  02/25/20 148 lb (67.1 kg)  02/25/20 148 lb 6.4 oz (67.3 kg)  11/08/19 154 lb 5.2 oz (70 kg)   Past Medical History:  Diagnosis Date  . Allergic rhinitis   . Asthma   . Barrett's esophagus   . COPD (chronic obstructive pulmonary disease) (HCC)   . DJD (degenerative joint disease)    hand  . Endometriosis   . GERD (gastroesophageal reflux disease)   . History of shingles    Left upper arm/axilla  . Hyperlipidemia   . Hypertension   . Osteoporosis   . Vitamin D deficiency    Past Surgical History:  Procedure Laterality Date  . ABDOMINAL HYSTERECTOMY  1979  . APPENDECTOMY    . CHOLECYSTECTOMY  1995  . NASAL SEPTUM SURGERY    . TRACHEOSTOMY     s/p temp; at 85 yo for croup  . VESICOVAGINAL FISTULA CLOSURE W/ TAH      reports that she has never smoked.  She has never used smokeless tobacco. She reports current alcohol use. She reports that she does not use drugs. family history includes Asthma in her sister; Heart attack in her mother; Rheum arthritis in her sister and sister; Stomach cancer in her maternal grandfather; Stroke in her father. Allergies  Allergen Reactions  . Alendronate Sodium Other (See Comments)    REACTION: leg cramps  . Atorvastatin Other (See Comments)    Myalgia, weakness in legs and arms  . Ibandronate Sodium Other (See Comments)    REACTION: gi upset, and sluggish  . Prednisone Swelling    Mouth and tongue swelling  . Ramipril Swelling    Throat, mouth, and lip swelling  . Statins Other (See Comments)    REACTION: myalgias; weakness in legs and arms   Current Outpatient Medications on File Prior to Visit  Medication Sig Dispense Refill  . acetaminophen (TYLENOL) 325 MG tablet Take 162.5 mg by mouth every 6 (six) hours as needed for headache (pain).    Marland Kitchen albuterol (PROAIR HFA) 108 (90 Base) MCG/ACT inhaler inhale 2 puffs by mouth every 4 hours if needed for wheezing and shortness of breath (Patient taking differently: Inhale 2 puffs into the lungs every 4 (four) hours as needed for wheezing or shortness of breath. ) 18 g PRN  . amitriptyline (ELAVIL) 25 MG tablet Take 0.5 tablets (12.5 mg total)  by mouth at bedtime as needed for sleep. (Patient taking differently: Take 12 mg by mouth at bedtime as needed (pain/itching/anxiety from shingles). ) 45 tablet 1  . Ascorbic Acid (VITAMIN C PO) Take 0.5 mg by mouth See admin instructions. Take 1/2 tablet by mouth three to four times monthly.    Marland Kitchen aspirin EC 81 MG tablet Take 81 mg by mouth daily with breakfast.    . Cod Liver Oil 1000 MG CAPS Take 1,000 mg by mouth daily with breakfast.    . diphenhydrAMINE (BENADRYL) 25 MG tablet Take 12.5 mg by mouth every 6 (six) hours as needed for itching.    . fluticasone (FLONASE) 50 MCG/ACT nasal spray Place 1 spray into both  nostrils daily as needed for allergies or rhinitis.    . hydrochlorothiazide (HYDRODIURIL) 25 MG tablet TAKE 1 TABLET(25 MG) BY MOUTH DAILY (Patient taking differently: Take 25 mg by mouth daily with breakfast. ) 90 tablet 3  . hydrocortisone cream 1 % Apply 1 application topically 2 (two) times daily as needed for itching.    Marland Kitchen ibuprofen (ADVIL) 200 MG tablet Take 200 mg by mouth every 6 (six) hours as needed for headache (pain).    . Multiple Vitamin (MULTIVITAMIN WITH MINERALS) TABS tablet Take 1 tablet by mouth daily with breakfast. Centrum A-Z    . vitamin E 400 UNIT capsule Take 400 Units by mouth daily with breakfast.      No current facility-administered medications on file prior to visit.   ROS:  All otherwise neg per pt    Objective:   Physical Exam BP (!) 170/76 (BP Location: Left Arm, Patient Position: Sitting, Cuff Size: Large)   Pulse 76   Temp 98.6 F (37 C) (Oral)   Ht 5\' 1"  (1.549 m)   Wt 148 lb (67.1 kg)   SpO2 97%   BMI 27.96 kg/m  VS noted,  Constitutional: Pt appears in NAD HENT: Head: NCAT.  Right Ear: External ear normal.  Left Ear: External ear normal.  Eyes: . Pupils are equal, round, and reactive to light. Conjunctivae and EOM are normal Nose: without d/c or deformity Neck: Neck supple. Gross normal ROM Cardiovascular: Normal rate and regular rhythm.   Pulmonary/Chest: Effort normal and breath sounds without rales or wheezing.  Abd:  Soft, NT, ND, + BS, no organomegaly but has 6 cm area of angioedema like nontender swelling/rash Neurological: Pt is alert. At baseline orientation, motor grossly intact Skin: Skin is warm. No rashes, other new lesions, no LE edema Psychiatric: Pt behavior is normal without agitation  All otherwise neg per pt Lab Results  Component Value Date   WBC 6.6 11/08/2019   HGB 13.9 11/08/2019   HCT 39.5 11/08/2019   PLT 256 11/08/2019   GLUCOSE 104 (H) 11/08/2019   CHOL 183 12/14/2018   TRIG 198.0 (H) 12/14/2018   HDL 41.90  12/14/2018   LDLDIRECT 106.0 12/02/2015   LDLCALC 102 (H) 12/14/2018   ALT 13 12/14/2018   AST 20 12/14/2018   NA 138 11/08/2019   K 3.6 11/08/2019   CL 99 11/08/2019   CREATININE 0.61 11/08/2019   BUN 10 11/08/2019   CO2 27 11/08/2019   TSH 1.77 12/14/2018      Assessment & Plan:

## 2020-02-25 NOTE — Assessment & Plan Note (Signed)
Uncontrolled, add labetolol 200 bid, fu bp at home and next visit

## 2020-04-02 ENCOUNTER — Other Ambulatory Visit: Payer: Self-pay | Admitting: Internal Medicine

## 2020-04-02 NOTE — Telephone Encounter (Signed)
Please refill as per office routine med refill policy (all routine meds refilled for 3 mo or monthly per pt preference up to one year from last visit, then month to month grace period for 3 mo, then further med refills will have to be denied)  

## 2020-05-05 ENCOUNTER — Other Ambulatory Visit: Payer: Self-pay | Admitting: Internal Medicine

## 2020-05-05 NOTE — Telephone Encounter (Signed)
Please refill as per office routine med refill policy (all routine meds refilled for 3 mo or monthly per pt preference up to one year from last visit, then month to month grace period for 3 mo, then further med refills will have to be denied)  

## 2020-05-27 ENCOUNTER — Encounter: Payer: Self-pay | Admitting: Internal Medicine

## 2020-05-27 ENCOUNTER — Ambulatory Visit: Payer: Medicare Other | Admitting: Internal Medicine

## 2020-05-27 ENCOUNTER — Other Ambulatory Visit: Payer: Self-pay

## 2020-05-27 VITALS — BP 154/98 | HR 72 | Temp 98.4°F | Ht 61.0 in | Wt 145.0 lb

## 2020-05-27 DIAGNOSIS — I10 Essential (primary) hypertension: Secondary | ICD-10-CM

## 2020-05-27 DIAGNOSIS — J4530 Mild persistent asthma, uncomplicated: Secondary | ICD-10-CM

## 2020-05-27 DIAGNOSIS — E559 Vitamin D deficiency, unspecified: Secondary | ICD-10-CM

## 2020-05-27 DIAGNOSIS — Z0000 Encounter for general adult medical examination without abnormal findings: Secondary | ICD-10-CM | POA: Insufficient documentation

## 2020-05-27 DIAGNOSIS — Z23 Encounter for immunization: Secondary | ICD-10-CM

## 2020-05-27 DIAGNOSIS — E785 Hyperlipidemia, unspecified: Secondary | ICD-10-CM | POA: Diagnosis not present

## 2020-05-27 NOTE — Progress Notes (Signed)
Subjective:    Patient ID: Sierra Robbins, female    DOB: 25-Aug-1935, 85 y.o.   MRN: [id number]  HPI  Here to f/u; overall doing ok,  Pt denies chest pain, increasing sob or doe, wheezing, orthopnea, PND, increased LE swelling, palpitations, dizziness or syncope.  Pt denies new neurological symptoms such as new headache, or facial or extremity weakness or numbness.  Pt denies polydipsia, polyuria, or low sugar episode.  Pt states overall good compliance with meds, mostly trying to follow appropriate diet, with wt overall stable,  but little exercise however.  Use the push mower to mow the grass yesterday, over 4 hrs work about once per wk.  BP last mon 146/79, not usually higher than that. Due for flu shot Past Medical History:  Diagnosis Date  . Allergic rhinitis   . Asthma   . Barrett's esophagus   . COPD (chronic obstructive pulmonary disease) (HCC)   . DJD (degenerative joint disease)    hand  . Endometriosis   . GERD (gastroesophageal reflux disease)   . History of shingles    Left upper arm/axilla  . Hyperlipidemia   . Hypertension   . Osteoporosis   . Vitamin D deficiency    Past Surgical History:  Procedure Laterality Date  . ABDOMINAL HYSTERECTOMY  1979  . APPENDECTOMY    . CHOLECYSTECTOMY  1995  . NASAL SEPTUM SURGERY    . TRACHEOSTOMY     s/p temp; at 85 yo for croup  . VESICOVAGINAL FISTULA CLOSURE W/ TAH      reports that she has never smoked. She has never used smokeless tobacco. She reports current alcohol use. She reports that she does not use drugs. family history includes Asthma in her sister; Heart attack in her mother; Rheum arthritis in her sister and sister; Stomach cancer in her maternal grandfather; Stroke in her father. Allergies  Allergen Reactions  . Alendronate Sodium Other (See Comments)    REACTION: leg cramps  . Atorvastatin Other (See Comments)    Myalgia, weakness in legs and arms  . Ibandronate Sodium Other (See Comments)    REACTION: gi  upset, and sluggish  . Prednisone Swelling    Mouth and tongue swelling  . Ramipril Swelling    Throat, mouth, and lip swelling  . Statins Other (See Comments)    REACTION: myalgias; weakness in legs and arms   Current Outpatient Medications on File Prior to Visit  Medication Sig Dispense Refill  . acetaminophen (TYLENOL) 325 MG tablet Take 162.5 mg by mouth every 6 (six) hours as needed for headache (pain).    Marland Kitchen albuterol (PROAIR HFA) 108 (90 Base) MCG/ACT inhaler inhale 2 puffs by mouth every 4 hours if needed for wheezing and shortness of breath (Patient taking differently: Inhale 2 puffs into the lungs every 4 (four) hours as needed for wheezing or shortness of breath. ) 18 g PRN  . amitriptyline (ELAVIL) 25 MG tablet Take 0.5 tablets (12.5 mg total) by mouth at bedtime as needed for sleep. (Patient taking differently: Take 12 mg by mouth at bedtime as needed (pain/itching/anxiety from shingles). ) 45 tablet 1  . Ascorbic Acid (VITAMIN C PO) Take 0.5 mg by mouth See admin instructions. Take 1/2 tablet by mouth three to four times monthly.    Marland Kitchen aspirin EC 81 MG tablet Take 81 mg by mouth daily with breakfast.    . Cod Liver Oil 1000 MG CAPS Take 1,000 mg by mouth daily with breakfast.    .  diphenhydrAMINE (BENADRYL) 25 MG tablet Take 12.5 mg by mouth every 6 (six) hours as needed for itching.    . fluticasone (FLONASE) 50 MCG/ACT nasal spray Place 1 spray into both nostrils daily as needed for allergies or rhinitis.    . hydrochlorothiazide (HYDRODIURIL) 25 MG tablet TAKE 1 TABLET(25 MG) BY MOUTH DAILY 90 tablet 1  . hydrocortisone cream 1 % Apply 1 application topically 2 (two) times daily as needed for itching.    Marland Kitchen ibuprofen (ADVIL) 200 MG tablet Take 200 mg by mouth every 6 (six) hours as needed for headache (pain).    Marland Kitchen labetalol (NORMODYNE) 200 MG tablet Take 1 tablet (200 mg total) by mouth 2 (two) times daily. 60 tablet 11  . Multiple Vitamin (MULTIVITAMIN WITH MINERALS) TABS tablet  Take 1 tablet by mouth daily with breakfast. Centrum A-Z    . vitamin E 400 UNIT capsule Take 400 Units by mouth daily with breakfast.     . methylPREDNISolone (MEDROL DOSEPAK) 4 MG TBPK tablet 4 tab by mouth x 3 day,2 tab x 3day,1 tab x 3 day then stop (Patient not taking: Reported on 05/27/2020) 21 tablet 0   No current facility-administered medications on file prior to visit.   Review of Systems All otherwise neg per pt     Objective:   Physical Exam BP (!) 154/98 (BP Location: Left Arm, Patient Position: Sitting, Cuff Size: Normal)   Pulse 72   Temp 98.4 F (36.9 C) (Oral)   Ht 5\' 1"  (1.549 m)   Wt 145 lb (65.8 kg)   SpO2 96%   BMI 27.40 kg/m  VS noted,  Constitutional: Pt appears in NAD HENT: Head: NCAT.  Right Ear: External ear normal.  Left Ear: External ear normal.  Eyes: . Pupils are equal, round, and reactive to light. Conjunctivae and EOM are normal Nose: without d/c or deformity Neck: Neck supple. Gross normal ROM Cardiovascular: Normal rate and regular rhythm.   Pulmonary/Chest: Effort normal and breath sounds without rales or wheezing.  Abd:  Soft, NT, ND, + BS, no organomegaly Neurological: Pt is alert. At baseline orientation, motor grossly intact Skin: Skin is warm. No rashes, other new lesions, no LE edema Psychiatric: Pt behavior is normal without agitation  All otherwise neg per pt Lab Results  Component Value Date   WBC 6.6 11/08/2019   HGB 13.9 11/08/2019   HCT 39.5 11/08/2019   PLT 256 11/08/2019   GLUCOSE 104 (H) 11/08/2019   CHOL 183 12/14/2018   TRIG 198.0 (H) 12/14/2018   HDL 41.90 12/14/2018   LDLDIRECT 106.0 12/02/2015   LDLCALC 102 (H) 12/14/2018   ALT 13 12/14/2018   AST 20 12/14/2018   NA 138 11/08/2019   K 3.6 11/08/2019   CL 99 11/08/2019   CREATININE 0.61 11/08/2019   BUN 10 11/08/2019   CO2 27 11/08/2019   TSH 1.77 12/14/2018      Assessment & Plan:

## 2020-05-27 NOTE — Patient Instructions (Signed)
You had the flu shot today  Please remember to have your COVID booster in may 2021  Please continue all other medications as before, and refills have been done if requested.  Please have the pharmacy call with any other refills you may need.  Please continue your efforts at being more active, low cholesterol diet, and weight control.  Please keep your appointments with your specialists as you may have planned  Please make an Appointment to return in 6 months, or sooner if needed

## 2020-05-31 ENCOUNTER — Encounter: Payer: Self-pay | Admitting: Internal Medicine

## 2020-05-31 NOTE — Assessment & Plan Note (Addendum)

## 2020-05-31 NOTE — Assessment & Plan Note (Signed)
stable overall by history and exam, recent data reviewed with pt, and pt to continue medical treatment as before,  to f/u any worsening symptoms or concerns  

## 2020-05-31 NOTE — Assessment & Plan Note (Signed)
Cont oral replacement 

## 2020-09-21 NOTE — Progress Notes (Signed)
HPI female never smoker followed for allergic rhinitis, asthma, complicated by GERD/Barrett's, HBP Office Spirometry 09/13/2016-moderate airway obstruction. FVC 1.71/79%, FEV1 1.08/67%, ratio 0.63, FEF 25-75 percent 0.49/40  --------------------------------------------------------------------------------   09/23/19- Virtual Visit via Telephone Note  History of Present Illness: 86 year old female never smoker followed for allergic rhinitis, asthma, complicated by GERD/Barrett's, HBP ------f/u mild persistent asthma  Albuterol hfa, Breo 100, Nasacort Excellent year with no significant aspiration.  Last used rescue inhaler about 2 weeks ago and not needing Breo. Asks to keep amoxacillin available- uses rarely for acute URI.  Some nasal congestion- uses flonase when needed.  Denies other health issues.   Observations/Objective:   Assessment and Plan: Asthma- mild intermittent uncomplicated. Rescue inhaler sufficient this year. Allergic Rhinitis- usually Flonase sufficient  Follow Up Instructions: 1 year    09/22/20- 86 year old female never smoker followed for allergic Rhinitis, Asthma, complicated by GERD/Barrett's, HTN, ------f/u mild persistent asthma  Proair hfa, Breo 100?, Nasacort Covid vax-3 Phizer Flu vax-had Took Medrol dosepak in Sept for YJ sting -----Patient states that she is feeling great, has hardly used her rescue inhaler, Occasional productive cough with clear sputum. No concerns. ACT- 23 Very pleased with status this year. Never picked up Breo sample- never felt need. Rarely uses rescue hfa. Asks refills Proair and amoxacillin to hold. Describes large local reaction to yellow jacket sting through shirt while mowing lawn last summer.   ROS-see HPI  + = positive Constitutional:   No-   weight loss, night sweats, fevers, chills, fatigue, lassitude. HEENT:   + headaches, no- difficulty swallowing, tooth/dental problems, sore throat,       +sneezing, no-itching, ear  ache, +nasal congestion, + post nasal drip,  CV:  No-   chest pain, orthopnea, PND, swelling in lower extremities, anasarca, dizziness, palpitations Resp: No-   shortness of breath with exertion or at rest.              No-   productive cough,  No non-productive cough,  No- coughing up of blood.              No-   change in color of mucus.  + wheezing.   Skin: No-   rash or lesions. GI:  No-   heartburn, indigestion, abdominal pain, nausea, vomiting,  GU: MS:  No-   joint pain or swelling.   Neuro-     nothing unusual Psych:  No- change in mood or affect. No depression or anxiety.  No memory loss.  OBJ General- Alert, Oriented, Affect-appropriate, Distress- none acute Skin- rash-none, lesions- none, excoriation- none Lymphadenopathy- none Head- atraumatic            Eyes- Gross vision intact, PERRLA, conjunctivae clear secretions            Ears- Hearing, canals-normal            Nose- , no-Septal dev, mucus, polyps, erosion, perforation ,             Throat- Mallampati III , mucosa clear , drainage- none, tonsils- atrophic.                     + Dentures Neck- flexible , old sternal scar ( surgical complication of ?thyroid surgery?) , no stridor , thyroid nl, carotid no bruit Chest - symmetrical excursion , unlabored           Heart/CV- RRR , no murmur , no gallop  , no rub, nl s1 s2                           -  JVD- none , edema- none, stasis changes- none, varices- none           Lung- clear to P&A, wheeze- none, cough- none , dullness-none, rub- none           Chest wall- sternotomy scar Abd- Br/ Gen/ Rectal- Not done, not indicated Extrem- cyanosis- none, clubbing, none, atrophy- none, strength- nl Neuro- grossly intact to observation

## 2020-09-22 ENCOUNTER — Ambulatory Visit: Payer: Medicare Other | Admitting: Internal Medicine

## 2020-09-22 ENCOUNTER — Encounter: Payer: Self-pay | Admitting: Internal Medicine

## 2020-09-22 ENCOUNTER — Other Ambulatory Visit: Payer: Self-pay

## 2020-09-22 DIAGNOSIS — K2100 Gastro-esophageal reflux disease with esophagitis, without bleeding: Secondary | ICD-10-CM | POA: Diagnosis not present

## 2020-09-22 DIAGNOSIS — J4530 Mild persistent asthma, uncomplicated: Secondary | ICD-10-CM

## 2020-09-22 MED ORDER — ALBUTEROL SULFATE HFA 108 (90 BASE) MCG/ACT IN AERS: INHALATION_SPRAY | RESPIRATORY_TRACT | 99 refills | Status: DC

## 2020-09-22 MED ORDER — AMOXICILLIN 500 MG PO TABS: 500.0000 mg | ORAL_TABLET | Freq: Two times a day (BID) | ORAL | 5 refills | Status: DC

## 2020-09-22 NOTE — Assessment & Plan Note (Signed)
Mild intermittent uncomplicated. Rescue inhaler alone is sufficient. Plan- refilled Proair

## 2020-09-22 NOTE — Patient Instructions (Signed)
Refills scripts sent for albuterol rescue inhaler and amoxacillin to have on hand, as before.  Really glad you are doing so well. Please call if we can help

## 2020-09-22 NOTE — Assessment & Plan Note (Signed)
She remains careful about reflux precautions, which has reduced her incidence of bronchitis flares. Plan- continue precautions

## 2020-11-02 ENCOUNTER — Other Ambulatory Visit: Payer: Self-pay | Admitting: Internal Medicine

## 2020-11-24 ENCOUNTER — Encounter: Payer: Self-pay | Admitting: Internal Medicine

## 2020-11-24 ENCOUNTER — Other Ambulatory Visit: Payer: Self-pay

## 2020-11-24 ENCOUNTER — Ambulatory Visit: Payer: Medicare Other | Admitting: Internal Medicine

## 2020-11-24 VITALS — BP 172/84 | HR 82 | Temp 98.1°F | Ht 60.0 in | Wt 148.0 lb

## 2020-11-24 DIAGNOSIS — E559 Vitamin D deficiency, unspecified: Secondary | ICD-10-CM

## 2020-11-24 DIAGNOSIS — R739 Hyperglycemia, unspecified: Secondary | ICD-10-CM | POA: Diagnosis not present

## 2020-11-24 DIAGNOSIS — E785 Hyperlipidemia, unspecified: Secondary | ICD-10-CM

## 2020-11-24 DIAGNOSIS — J4530 Mild persistent asthma, uncomplicated: Secondary | ICD-10-CM

## 2020-11-24 DIAGNOSIS — E538 Deficiency of other specified B group vitamins: Secondary | ICD-10-CM

## 2020-11-24 DIAGNOSIS — I10 Essential (primary) hypertension: Secondary | ICD-10-CM | POA: Diagnosis not present

## 2020-11-24 DIAGNOSIS — Z0001 Encounter for general adult medical examination with abnormal findings: Secondary | ICD-10-CM | POA: Diagnosis not present

## 2020-11-24 LAB — HEMOGLOBIN A1C: Hgb A1c MFr Bld: 5.4 % (ref 4.6–6.5)

## 2020-11-24 LAB — BASIC METABOLIC PANEL
BUN: 13 mg/dL (ref 6–23)
CO2: 32 mEq/L (ref 19–32)
Calcium: 9.5 mg/dL (ref 8.4–10.5)
Chloride: 101 mEq/L (ref 96–112)
Creatinine, Ser: 0.56 mg/dL (ref 0.40–1.20)
GFR: 84.37 mL/min (ref >60.00)
Glucose, Bld: 103 mg/dL — ABNORMAL HIGH (ref 70–99)
Potassium: 4.3 mEq/L (ref 3.5–5.1)
Sodium: 140 mEq/L (ref 135–145)

## 2020-11-24 LAB — URINALYSIS, ROUTINE W REFLEX MICROSCOPIC
Bilirubin Urine: NEGATIVE
Hgb urine dipstick: NEGATIVE
Ketones, ur: NEGATIVE
Leukocytes,Ua: NEGATIVE
Nitrite: NEGATIVE
RBC / HPF: NONE SEEN (ref 0–?)
Specific Gravity, Urine: 1.015 (ref 1.000–1.030)
Total Protein, Urine: NEGATIVE
Urine Glucose: NEGATIVE
Urobilinogen, UA: 0.2 (ref 0.0–1.0)
pH: 7.5 (ref 5.0–8.0)

## 2020-11-24 LAB — HEPATIC FUNCTION PANEL
ALT: 14 U/L (ref 0–35)
AST: 21 U/L (ref 0–37)
Albumin: 4.3 g/dL (ref 3.5–5.2)
Alkaline Phosphatase: 63 U/L (ref 39–117)
Bilirubin, Direct: 0.1 mg/dL (ref 0.0–0.3)
Total Bilirubin: 0.8 mg/dL (ref 0.2–1.2)
Total Protein: 7.6 g/dL (ref 6.0–8.3)

## 2020-11-24 LAB — CBC WITH DIFFERENTIAL/PLATELET
Basophils Absolute: 0.1 10*3/uL (ref 0.0–0.1)
Basophils Relative: 1.3 % (ref 0.0–3.0)
Eosinophils Absolute: 0.4 10*3/uL (ref 0.0–0.7)
Eosinophils Relative: 6.8 % — ABNORMAL HIGH (ref 0.0–5.0)
HCT: 39.4 % (ref 36.0–46.0)
Hemoglobin: 13.7 g/dL (ref 12.0–15.0)
Lymphocytes Relative: 38.2 % (ref 12.0–46.0)
Lymphs Abs: 2.1 10*3/uL (ref 0.7–4.0)
MCHC: 34.8 g/dL (ref 30.0–36.0)
MCV: 100.3 fl — ABNORMAL HIGH (ref 78.0–100.0)
Monocytes Absolute: 0.5 10*3/uL (ref 0.1–1.0)
Monocytes Relative: 8.4 % (ref 3.0–12.0)
Neutro Abs: 2.5 10*3/uL (ref 1.4–7.7)
Neutrophils Relative %: 45.3 % (ref 43.0–77.0)
Platelets: 244 10*3/uL (ref 150.0–400.0)
RBC: 3.92 Mil/uL (ref 3.87–5.11)
RDW: 12.7 % (ref 11.5–15.5)
WBC: 5.5 10*3/uL (ref 4.0–10.5)

## 2020-11-24 LAB — LIPID PANEL
Cholesterol: 187 mg/dL (ref 0–200)
HDL: 47.6 mg/dL (ref >39.00)
LDL Cholesterol: 104 mg/dL — ABNORMAL HIGH (ref 0–99)
NonHDL: 139.18
Total CHOL/HDL Ratio: 4
Triglycerides: 175 mg/dL — ABNORMAL HIGH (ref 0.0–149.0)
VLDL: 35 mg/dL (ref 0.0–40.0)

## 2020-11-24 LAB — TSH: TSH: 1.64 u[IU]/mL (ref 0.35–4.50)

## 2020-11-24 LAB — VITAMIN B12: Vitamin B-12: 185 pg/mL — ABNORMAL LOW (ref 211–911)

## 2020-11-24 LAB — VITAMIN D 25 HYDROXY (VIT D DEFICIENCY, FRACTURES): VITD: 31.4 ng/mL (ref 30.00–100.00)

## 2020-11-24 NOTE — Progress Notes (Signed)
Patient ID: Sierra Robbins, female   DOB: [birth year], 86 y.o.   MRN: [id number]         Chief Complaint:: wellness exam and Follow-up (6 month follow-up)  HTN, leg swelling, hyperglycemia, vit D and b12 deficiency, hld, asthma       HPI:  Sierra Robbins is a 86 y.o. female here for wellness exam; already up to date with preventive referrals and immunizations                        Also BP at home has been similar to maybe slightly higher than 140/90; Pt denies chest pain, increased sob or doe, wheezing, orthopnea, PND, palpitations, dizziness or syncope, but has slight worsening leg swelling below the knees in the past month, better in the am, then worse in the PM.   Pt denies polydipsia, polyuria,  Not taking the labetolo as wary of side effects.  BP at home in the 140-150s on average, and not wanting more control for now.  Still mows her yard on regular basis.    Denies worsening neuro focal s/s.  Not taking Vit b12 or D.  Trying to follow a lower chol diet but not always successful.   Pt denies fever, wt loss, night sweats, loss of appetite, or other constitutional symptoms  No other new complaints Wt Readings from Last 3 Encounters:  11/24/20 148 lb (67.1 kg)  09/22/20 144 lb (65.3 kg)  05/27/20 145 lb (65.8 kg)   BP Readings from Last 3 Encounters:  11/24/20 (!) 172/84  09/22/20 (!) 150/80  05/27/20 (!) 154/98   Immunization History  Administered Date(s) Administered  . Fluad Quad(high Dose 65+) 06/13/2019, 05/27/2020  . H1N1 07/29/2008  . Influenza Split 05/29/2012, 05/25/2013, 05/29/2014  . Influenza Whole 06/08/2007, 04/29/2008, 06/29/2010, 05/30/2011  . Influenza, High Dose Seasonal PF 05/12/2016, 05/25/2017, 05/28/2018  . Influenza-Unspecified 05/24/2015  . PFIZER(Purple Top)SARS-COV-2 Vaccination 01/02/2020, 01/23/2020, 09/09/2020  . Pneumococcal Conjugate-13 11/27/2013  . Pneumococcal Polysaccharide-23 07/01/2002, 09/16/2008  . Td 09/16/2008  . Tdap 02/22/2019  There are  no preventive care reminders to display for this patient.    Past Medical History:  Diagnosis Date  . Allergic rhinitis   . Asthma   . Barrett's esophagus   . COPD (chronic obstructive pulmonary disease) (HCC)   . DJD (degenerative joint disease)    hand  . Endometriosis   . GERD (gastroesophageal reflux disease)   . History of shingles    Left upper arm/axilla  . Hyperlipidemia   . Hypertension   . Osteoporosis   . Vitamin D deficiency    Past Surgical History:  Procedure Laterality Date  . ABDOMINAL HYSTERECTOMY  1979  . APPENDECTOMY    . CHOLECYSTECTOMY  1995  . NASAL SEPTUM SURGERY    . TRACHEOSTOMY     s/p temp; at 86 yo for croup  . VESICOVAGINAL FISTULA CLOSURE W/ TAH      reports that she has never smoked. She has never used smokeless tobacco. She reports current alcohol use. She reports that she does not use drugs. family history includes Asthma in her sister; Heart attack in her mother; Rheum arthritis in her sister and sister; Stomach cancer in her maternal grandfather; Stroke in her father. Allergies  Allergen Reactions  . Alendronate Sodium Other (See Comments)    REACTION: leg cramps  . Atorvastatin Other (See Comments)    Myalgia, weakness in legs and arms  . Ibandronate Sodium Other (See  Comments)    REACTION: gi upset, and sluggish  . Prednisone Swelling    Mouth and tongue swelling  . Ramipril Swelling    Throat, mouth, and lip swelling  . Statins Other (See Comments)    REACTION: myalgias; weakness in legs and arms   Current Outpatient Medications on File Prior to Visit  Medication Sig Dispense Refill  . acetaminophen (TYLENOL) 325 MG tablet Take 162.5 mg by mouth every 6 (six) hours as needed for headache (pain).    Marland Kitchen albuterol (PROAIR HFA) 108 (90 Base) MCG/ACT inhaler inhale 2 puffs by mouth every 4 hours if needed for wheezing and shortness of breath 18 g PRN  . amitriptyline (ELAVIL) 25 MG tablet Take 0.5 tablets (12.5 mg total) by mouth at  bedtime as needed for sleep. (Patient taking differently: Take 12 mg by mouth at bedtime as needed (pain/itching/anxiety from shingles).) 45 tablet 1  . amoxicillin (AMOXIL) 500 MG tablet Take 1 tablet (500 mg total) by mouth 2 (two) times daily. 14 tablet 5  . Ascorbic Acid (VITAMIN C PO) Take 0.5 mg by mouth See admin instructions. Take 1/2 tablet by mouth three to four times monthly.    Marland Kitchen aspirin EC 81 MG tablet Take 81 mg by mouth daily with breakfast.    . Cod Liver Oil 1000 MG CAPS Take 1,000 mg by mouth daily with breakfast.    . diphenhydrAMINE (BENADRYL) 25 MG tablet Take 12.5 mg by mouth every 6 (six) hours as needed for itching.    . fluticasone (FLONASE) 50 MCG/ACT nasal spray Place 1 spray into both nostrils daily as needed for allergies or rhinitis.    . hydrochlorothiazide (HYDRODIURIL) 25 MG tablet TAKE 1 TABLET(25 MG) BY MOUTH DAILY 90 tablet 1  . hydrocortisone cream 1 % Apply 1 application topically 2 (two) times daily as needed for itching.    Marland Kitchen ibuprofen (ADVIL) 200 MG tablet Take 200 mg by mouth every 6 (six) hours as needed for headache (pain).    . Multiple Vitamin (MULTIVITAMIN WITH MINERALS) TABS tablet Take 1 tablet by mouth daily with breakfast. Centrum A-Z    . vitamin E 400 UNIT capsule Take 400 Units by mouth daily with breakfast.      No current facility-administered medications on file prior to visit.        ROS:  All others reviewed and negative.  Objective        PE:  BP (!) 172/84 (BP Location: Left Arm, Patient Position: Sitting, Cuff Size: Normal)   Pulse 82   Temp 98.1 F (36.7 C) (Oral)   Ht 5' (1.524 m)   Wt 148 lb (67.1 kg)   SpO2 96%   BMI 28.90 kg/m                 Constitutional: Pt appears in NAD               HENT: Head: NCAT.                Right Ear: External ear normal.                 Left Ear: External ear normal.                Eyes: . Pupils are equal, round, and reactive to light. Conjunctivae and EOM are normal                Nose: without d/c or deformity  Neck: Neck supple. Gross normal ROM               Cardiovascular: Normal rate and regular rhythm.                 Pulmonary/Chest: Effort normal and breath sounds without rales or wheezing.                Abd:  Soft, NT, ND, + BS, no organomegaly               Neurological: Pt is alert. At baseline orientation, motor grossly intact               Skin: Skin is warm. No rashes, no other new lesions, LE edema - trace bilat               Psychiatric: Pt behavior is normal without agitation   Micro: none  Cardiac tracings I have personally interpreted today:  none  Pertinent Radiological findings (summarize): none   Lab Results  Component Value Date   WBC 5.5 11/24/2020   HGB 13.7 11/24/2020   HCT 39.4 11/24/2020   PLT 244.0 11/24/2020   GLUCOSE 103 (H) 11/24/2020   CHOL 187 11/24/2020   TRIG 175.0 (H) 11/24/2020   HDL 47.60 11/24/2020   LDLDIRECT 106.0 12/02/2015   LDLCALC 104 (H) 11/24/2020   ALT 14 11/24/2020   AST 21 11/24/2020   NA 140 11/24/2020   K 4.3 11/24/2020   CL 101 11/24/2020   CREATININE 0.56 11/24/2020   BUN 13 11/24/2020   CO2 32 11/24/2020   TSH 1.64 11/24/2020   HGBA1C 5.4 11/24/2020   Assessment/Plan:  Sierra Robbins is a 86 y.o. White or Caucasian [1] female with  has a past medical history of Allergic rhinitis, Asthma, Barrett's esophagus, COPD (chronic obstructive pulmonary disease) (HCC), DJD (degenerative joint disease), Endometriosis, GERD (gastroesophageal reflux disease), History of shingles, Hyperlipidemia, Hypertension, Osteoporosis, and Vitamin D deficiency.  Encounter for well adult exam with abnormal findings Age and sex appropriate education and counseling updated with regular exercise and diet Referrals for preventative services - none needed Immunizations addressed - none needed Smoking counseling  - none needed Evidence for depression or other mood disorder - none significant Most recent labs  reviewed. I have personally reviewed and have noted: 1) the patient's medical and social history 2) The patient's current medications and supplements 3) The patient's height, weight, and BMI have been recorded in the chart   Vitamin D deficiency Last vitamin D Lab Results  Component Value Date   VD25OH 31.40 11/24/2020   Low normal to start oral replacement   Hypertension, uncontrolled I suspect at least mild uncontrolled, not taking labetolol, and declines further med tx today, will cont low salt diet and f/u BP at home, cont hct as well BP Readings from Last 3 Encounters:  11/24/20 (!) 172/84  09/22/20 (!) 150/80  05/27/20 (!) 154/98    Hyperlipidemia Lab Results  Component Value Date   LDLCALC 104 (H) 11/24/2020   Stable, pt to continue current statin  - diet   Asthma, mild persistent Overall stable, cont current med tx - albuterol hfa prn   Hyperglycemia Lab Results  Component Value Date   HGBA1C 5.4 11/24/2020   Stable, pt to continue current medical treatment  - diet   B12 deficiency Lab Results  Component Value Date   VITAMINB12 185 (L) 11/24/2020   Low, to start oral replacement - b12 1000 mcg qd  Followup: Return in about 6 months (around 05/27/2021).  Oliver Barre, MD 12/01/2020 9:17 PM [facility]

## 2020-11-24 NOTE — Patient Instructions (Signed)

## 2020-12-01 ENCOUNTER — Encounter: Payer: Self-pay | Admitting: Internal Medicine

## 2020-12-01 DIAGNOSIS — R739 Hyperglycemia, unspecified: Secondary | ICD-10-CM | POA: Insufficient documentation

## 2020-12-01 DIAGNOSIS — E538 Deficiency of other specified B group vitamins: Secondary | ICD-10-CM | POA: Insufficient documentation

## 2020-12-01 NOTE — Assessment & Plan Note (Signed)

## 2020-12-01 NOTE — Assessment & Plan Note (Signed)
Lab Results  Component Value Date   HGBA1C 5.4 11/24/2020   Stable, pt to continue current medical treatment  - diet

## 2020-12-01 NOTE — Assessment & Plan Note (Signed)
Lab Results  Component Value Date   VITAMINB12 185 (L) 11/24/2020   Low, to start oral replacement - b12 1000 mcg qd

## 2020-12-01 NOTE — Assessment & Plan Note (Signed)
Overall stable, cont current med tx- albuterol hfa prn 

## 2020-12-01 NOTE — Assessment & Plan Note (Signed)
Lab Results  Component Value Date   LDLCALC 104 (H) 11/24/2020   Stable, pt to continue current statin  - diet

## 2020-12-01 NOTE — Assessment & Plan Note (Addendum)
I suspect at least mild uncontrolled, not taking labetolol, and declines further med tx today, will cont low salt diet and f/u BP at home, cont hct as well BP Readings from Last 3 Encounters:  11/24/20 (!) 172/84  09/22/20 (!) 150/80  05/27/20 (!) 154/98

## 2020-12-01 NOTE — Assessment & Plan Note (Signed)
Last vitamin D Lab Results  Component Value Date   VD25OH 31.40 11/24/2020   Low normal to start oral replacement

## 2021-04-30 ENCOUNTER — Other Ambulatory Visit: Payer: Self-pay | Admitting: Internal Medicine

## 2021-04-30 NOTE — Telephone Encounter (Signed)
1 mo refill ok - pt due for ROV

## 2021-05-27 ENCOUNTER — Ambulatory Visit: Payer: Medicare Other | Admitting: Internal Medicine

## 2021-05-27 ENCOUNTER — Encounter: Payer: Self-pay | Admitting: Internal Medicine

## 2021-05-27 ENCOUNTER — Other Ambulatory Visit: Payer: Self-pay

## 2021-05-27 VITALS — BP 158/70 | HR 69 | Temp 98.6°F | Ht 60.0 in | Wt 147.6 lb

## 2021-05-27 DIAGNOSIS — E559 Vitamin D deficiency, unspecified: Secondary | ICD-10-CM

## 2021-05-27 DIAGNOSIS — I10 Essential (primary) hypertension: Secondary | ICD-10-CM | POA: Diagnosis not present

## 2021-05-27 DIAGNOSIS — E785 Hyperlipidemia, unspecified: Secondary | ICD-10-CM

## 2021-05-27 DIAGNOSIS — M1712 Unilateral primary osteoarthritis, left knee: Secondary | ICD-10-CM | POA: Diagnosis not present

## 2021-05-27 DIAGNOSIS — E538 Deficiency of other specified B group vitamins: Secondary | ICD-10-CM

## 2021-05-27 DIAGNOSIS — Z23 Encounter for immunization: Secondary | ICD-10-CM | POA: Diagnosis not present

## 2021-05-27 NOTE — Assessment & Plan Note (Signed)
BP Readings from Last 3 Encounters:  05/27/21 (!) 158/70  11/24/20 (!) 172/84  09/22/20 (!) 150/80   Uncontrolled, pt to continue medical treatment - hct as states BP at home stable

## 2021-05-27 NOTE — Progress Notes (Signed)
Patient ID: Sierra Robbins, female   DOB: 1935/05/02, 86 y.o.   MRN: [id number]        Chief Complaint: follow up HTN, left knee djd, low vit d and b12       HPI:  Sierra Robbins is a 86 y.o. female here overall doing ok, Pt denies chest pain, increased sob or doe, wheezing, orthopnea, PND, increased LE swelling, palpitations, dizziness or syncope.   Pt denies polydipsia, polyuria, or new focal neuro s/s.  Not taking Vit D or b12.  Also has worsening 2 wks left knee pain and swelling, but no giveaways or falls, this is new for her.  BP controlled at home < 140/90.      Wt Readings from Last 3 Encounters:  05/27/21 147 lb 9.6 oz (67 kg)  11/24/20 148 lb (67.1 kg)  09/22/20 144 lb (65.3 kg)   BP Readings from Last 3 Encounters:  05/27/21 (!) 158/70  11/24/20 (!) 172/84  09/22/20 (!) 150/80         Past Medical History:  Diagnosis Date   Allergic rhinitis    Asthma    Barrett's esophagus    COPD (chronic obstructive pulmonary disease) (HCC)    DJD (degenerative joint disease)    hand   Endometriosis    GERD (gastroesophageal reflux disease)    History of shingles    Left upper arm/axilla   Hyperlipidemia    Hypertension    Osteoporosis    Vitamin D deficiency    Past Surgical History:  Procedure Laterality Date   ABDOMINAL HYSTERECTOMY  1979   APPENDECTOMY     CHOLECYSTECTOMY  1995   NASAL SEPTUM SURGERY     TRACHEOSTOMY     s/p temp; at 86 yo for croup   VESICOVAGINAL FISTULA CLOSURE W/ TAH      reports that she has never smoked. She has never used smokeless tobacco. She reports current alcohol use. She reports that she does not use drugs. family history includes Asthma in her sister; Heart attack in her mother; Rheum arthritis in her sister and sister; Stomach cancer in her maternal grandfather; Stroke in her father. Allergies  Allergen Reactions   Alendronate Sodium Other (See Comments)    REACTION: leg cramps   Atorvastatin Other (See Comments)    Myalgia, weakness  in legs and arms   Ibandronate Sodium Other (See Comments)    REACTION: gi upset, and sluggish   Prednisone Swelling    Mouth and tongue swelling   Ramipril Swelling    Throat, mouth, and lip swelling   Statins Other (See Comments)    REACTION: myalgias; weakness in legs and arms   Current Outpatient Medications on File Prior to Visit  Medication Sig Dispense Refill   acetaminophen (TYLENOL) 325 MG tablet Take 162.5 mg by mouth every 6 (six) hours as needed for headache (pain).     albuterol (PROAIR HFA) 108 (90 Base) MCG/ACT inhaler inhale 2 puffs by mouth every 4 hours if needed for wheezing and shortness of breath 18 g PRN   amitriptyline (ELAVIL) 25 MG tablet Take 0.5 tablets (12.5 mg total) by mouth at bedtime as needed for sleep. (Patient taking differently: Take 12 mg by mouth at bedtime as needed (pain/itching/anxiety from shingles).) 45 tablet 1   amoxicillin (AMOXIL) 500 MG tablet Take 1 tablet (500 mg total) by mouth 2 (two) times daily. 14 tablet 5   Ascorbic Acid (VITAMIN C PO) Take 0.5 mg by mouth See admin instructions.  Take 1/2 tablet by mouth three to four times monthly.     aspirin EC 81 MG tablet Take 81 mg by mouth daily with breakfast.     Cod Liver Oil 1000 MG CAPS Take 1,000 mg by mouth daily with breakfast.     diphenhydrAMINE (BENADRYL) 25 MG tablet Take 12.5 mg by mouth every 6 (six) hours as needed for itching.     fluticasone (FLONASE) 50 MCG/ACT nasal spray Place 1 spray into both nostrils daily as needed for allergies or rhinitis.     hydrochlorothiazide (HYDRODIURIL) 25 MG tablet TAKE 1 TABLETS BY MOUTH DAILY. APPT NEEDED FOR FUTURE REFILLS 30 tablet 0   hydrocortisone cream 1 % Apply 1 application topically 2 (two) times daily as needed for itching.     ibuprofen (ADVIL) 200 MG tablet Take 200 mg by mouth every 6 (six) hours as needed for headache (pain).     Multiple Vitamin (MULTIVITAMIN WITH MINERALS) TABS tablet Take 1 tablet by mouth daily with breakfast.  Centrum A-Z     vitamin E 400 UNIT capsule Take 400 Units by mouth daily with breakfast.      No current facility-administered medications on file prior to visit.        ROS:  All others reviewed and negative.  Objective        PE:  BP (!) 158/70 (BP Location: Left Arm, Patient Position: Sitting, Cuff Size: Normal)   Pulse 69   Temp 98.6 F (37 C) (Oral)   Ht 5' (1.524 m)   Wt 147 lb 9.6 oz (67 kg)   SpO2 96%   BMI 28.83 kg/m                 Constitutional: Pt appears in NAD               HENT: Head: NCAT.                Right Ear: External ear normal.                 Left Ear: External ear normal.                Eyes: . Pupils are equal, round, and reactive to light. Conjunctivae and EOM are normal               Nose: without d/c or deformity               Neck: Neck supple. Gross normal ROM               Cardiovascular: Normal rate and regular rhythm.                 Pulmonary/Chest: Effort normal and breath sounds without rales or wheezing.                Abd:  Soft, NT, ND, + BS, no organomegaly               Neurological: Pt is alert. At baseline orientation, motor grossly intact               Skin: Skin is warm. No rashes, no other new lesions, LE edema - none;  left knee with mod degnerative change and effusion               Psychiatric: Pt behavior is normal without agitation   Micro: none  Cardiac tracings I have personally interpreted today:  none  Pertinent Radiological findings (summarize):  none   Lab Results  Component Value Date   WBC 5.5 11/24/2020   HGB 13.7 11/24/2020   HCT 39.4 11/24/2020   PLT 244.0 11/24/2020   GLUCOSE 103 (H) 11/24/2020   CHOL 187 11/24/2020   TRIG 175.0 (H) 11/24/2020   HDL 47.60 11/24/2020   LDLDIRECT 106.0 12/02/2015   LDLCALC 104 (H) 11/24/2020   ALT 14 11/24/2020   AST 21 11/24/2020   NA 140 11/24/2020   K 4.3 11/24/2020   CL 101 11/24/2020   CREATININE 0.56 11/24/2020   BUN 13 11/24/2020   CO2 32 11/24/2020   TSH  1.64 11/24/2020   HGBA1C 5.4 11/24/2020   Assessment/Plan:  Sierra Robbins is a 86 y.o. White or Caucasian [1] female with  has a past medical history of Allergic rhinitis, Asthma, Barrett's esophagus, COPD (chronic obstructive pulmonary disease) (HCC), DJD (degenerative joint disease), Endometriosis, GERD (gastroesophageal reflux disease), History of shingles, Hyperlipidemia, Hypertension, Osteoporosis, and Vitamin D deficiency.  Vitamin D deficiency Last vitamin D Lab Results  Component Value Date   VD25OH 31.40 11/24/2020   Low, to start oral replacement   Hyperlipidemia Lab Results  Component Value Date   LDLCALC 104 (H) 11/24/2020   Uncontrolled, goal ldl < 100, pt to continue current low chol diet, delcines statin   B12 deficiency Lab Results  Component Value Date   VITAMINB12 185 (L) 11/24/2020   Low, to start oral replacement - b12 1000 mcg qd   Arthritis of left knee Mod to severe, for refer sports medicine for probable cortisone  Hypertension, uncontrolled BP Readings from Last 3 Encounters:  05/27/21 (!) 158/70  11/24/20 (!) 172/84  09/22/20 (!) 150/80   Uncontrolled, pt to continue medical treatment - hct as states BP at home stable  Followup: Return in about 6 months (around 11/24/2021).  Oliver Barre, MD 05/27/2021 8:20 PM [facility]

## 2021-05-27 NOTE — Assessment & Plan Note (Signed)
Last vitamin D Lab Results  Component Value Date   VD25OH 31.40 11/24/2020   Low, to start oral replacement

## 2021-05-27 NOTE — Assessment & Plan Note (Signed)
Mod to severe, for refer sports medicine for probable cortisone

## 2021-05-27 NOTE — Assessment & Plan Note (Signed)
Lab Results  Component Value Date   LDLCALC 104 (H) 11/24/2020   Uncontrolled, goal ldl < 100, pt to continue current low chol diet, delcines statin

## 2021-05-27 NOTE — Assessment & Plan Note (Signed)
Lab Results  Component Value Date   VITAMINB12 185 (L) 11/24/2020   Low, to start oral replacement - b12 1000 mcg qd  

## 2021-05-27 NOTE — Patient Instructions (Signed)
Please continue all other medications as before, including the Vitamin B12 and Vitamin D  Please have the pharmacy call with any other refills you may need.  Please continue your efforts at being more active, low cholesterol diet, and weight control.  Please keep your appointments with your specialists as you may have planned  You will be contacted regarding the referral for: Sports Medicine for the knees  Please make an Appointment to return in 6 months, or sooner if needed, also with Lab Appointment for testing done 3-5 days before at the FIRST FLOOR Lab (so this is for TWO appointments - please see the scheduling desk as you leave)  Due to the ongoing Covid 19 pandemic, our lab now requires an appointment for any labs done at our office.  If you need labs done and do not have an appointment, please call our office ahead of time to schedule before presenting to the lab for your testing.

## 2021-06-01 ENCOUNTER — Ambulatory Visit (INDEPENDENT_AMBULATORY_CARE_PROVIDER_SITE_OTHER): Payer: Medicare Other

## 2021-06-01 ENCOUNTER — Ambulatory Visit: Payer: Self-pay

## 2021-06-01 ENCOUNTER — Ambulatory Visit: Payer: Medicare Other | Admitting: Family Medicine

## 2021-06-01 ENCOUNTER — Other Ambulatory Visit: Payer: Self-pay

## 2021-06-01 VITALS — BP 166/92 | HR 75 | Ht 60.0 in | Wt 149.0 lb

## 2021-06-01 DIAGNOSIS — G8929 Other chronic pain: Secondary | ICD-10-CM

## 2021-06-01 DIAGNOSIS — M25562 Pain in left knee: Secondary | ICD-10-CM | POA: Diagnosis not present

## 2021-06-01 DIAGNOSIS — M1712 Unilateral primary osteoarthritis, left knee: Secondary | ICD-10-CM

## 2021-06-01 NOTE — Patient Instructions (Addendum)
Thank you for coming in today.   Please use Voltaren gel (Generic Diclofenac Gel) up to 4x daily for pain as needed.  This is available over-the-counter as both the name brand Voltaren gel and the generic diclofenac gel.   You received an injection today. Seek immediate medical attention if the joint becomes red, extremely painful, or is oozing fluid.   Please complete the exercises that the athletic trainer went over with you:  View at [email or web address] using code: [email or web address]  Recheck back in 6 weeks

## 2021-06-01 NOTE — Progress Notes (Signed)
I, Philbert Riser, LAT, ATC acting as a scribe for Clementeen Graham, MD.  Subjective:    CC: L knee pain  HPI: Pt is an 86 y/o female c/o L knee pain ongoing since Sept 2021. Pt relates her pain to developing after getting her COVID vaccines. Pt notes that her knee isn't all that painful, but it will swelling and worsen throughout the day. Pt can't really specify a location of pain, but a a general ache through L lower leg. Pt also c/o bilat LBP/SIJ area that worsens when doing yard work.  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  L knee swelling: yes- and into lower leg Radiates: yes Mechanical symptoms: no Aggravates: bending,  Treatments tried: creams, IBU, heat   Pertinent review of Systems: No fevers or chills  Relevant historical information: Hypertension Osteoporosis  Objective:    Vitals:   06/01/21 0833  BP: (!) 166/92  Pulse: 75  SpO2: 96%   General: Well Developed, well nourished, and in no acute distress.   MSK: Left knee mild effusion normal-appearing otherwise. Normal motion with crepitation. Tender palpation medial joint line. Slight laxity to MCL stress test. Intact strength. Negative McMurray's test.  Lab and Radiology Results  Procedure: Real-time Ultrasound Guided Injection of left knee superior lateral patellar space Device: Philips Affiniti 50G Images permanently stored and available for review in PACS Ultrasound evaluation prior to injection reveals mild joint effusion.  Narrowed degenerative appearing medial meniscus and joint line.  No Baker's cyst. Verbal informed consent obtained.  Discussed risks and benefits of procedure. Warned about infection bleeding damage to structures skin hypopigmentation and fat atrophy among others. Patient expresses understanding and agreement Time-out conducted.   Noted no overlying  erythema, induration, or other signs of local infection.   Skin prepped in a sterile fashion.   Local anesthesia: Topical Ethyl chloride.   With sterile technique and under real time ultrasound guidance: 40 mg of Kenalog and 2 mL of Marcaine injected into knee joint. Fluid seen entering the joint capsule.   Completed without difficulty   Pain immediately resolved suggesting accurate placement of the medication.   Advised to call if fevers/chills, erythema, induration, drainage, or persistent bleeding.   Images permanently stored and available for review in the ultrasound unit.  Impression: Technically successful ultrasound guided injection.    Straight images left knee obtained today personally and independently interpreted. Moderate to severe medial compartment DJD.  Mild patellofemoral DJD.  No acute fractures. Await formal radiology review    Impression and Recommendations:    Assessment and Plan: 86 y.o. female with left knee pain thought to be related to DJD.  Patient has moderate to severe medial compartment DJD.  She developed pain last year or after doing a lot of kneeling and landscaping.  I am not sure if this is related to the COVID-vaccine as it somewhat separated by time although its not impossible.  DJD is much more likely.  Plan to treat with quad strengthening exercises taught in clinic today by ATC, and Voltaren gel as well as steroid injection today.  Recheck in about 6 weeks.Marland Kitchen  PDMP not reviewed this encounter. Orders Placed This Encounter  Procedures   [place] LIMITED JOINT SPACE STRUCTURES LOW LEFT(NO LINKED CHARGES)    Standing Status:   Future    Number of Occurrences:   1    Standing  Expiration Date:   11/30/2021    Order Specific Question:   Reason for Exam (SYMPTOM  OR DIAGNOSIS REQUIRED)    Answer:   left knee pain    Order Specific Question:   Preferred imaging location?    Answer:   [facility] Knee AP/LAT W/[place] Left     Standing Status:   Future    Number of Occurrences:   1    Standing Expiration Date:   06/01/2022    Order Specific Question:   Reason for Exam (SYMPTOM  OR DIAGNOSIS REQUIRED)    Answer:   left knee pain    Order Specific Question:   Preferred imaging location?    Answer:   [facility]   No orders of the defined types were placed in this encounter.   Discussed warning signs or symptoms. Please see discharge instructions. Patient expresses understanding.   The above documentation has been reviewed and is accurate and complete Clementeen Graham, M.D.

## 2021-06-02 NOTE — Progress Notes (Signed)
Left knee x-ray shows severe arthritis changes in the knee.

## 2021-06-04 ENCOUNTER — Ambulatory Visit (INDEPENDENT_AMBULATORY_CARE_PROVIDER_SITE_OTHER): Payer: Medicare Other

## 2021-06-04 DIAGNOSIS — Z0000 Encounter for general adult medical examination without abnormal findings: Secondary | ICD-10-CM | POA: Diagnosis not present

## 2021-06-04 NOTE — Patient Instructions (Signed)
Sierra Robbins , Thank you for taking time to come for your Medicare Wellness Visit. I appreciate your ongoing commitment to your health goals. Please review the following plan we discussed and let me know if I can assist you in the future.   Screening recommendations/referrals: Colonoscopy: Not a candidate for screening due to age Mammogram: Not a candidate for screening due to age Bone Density: Not a candidate for screening due to age Recommended yearly ophthalmology/optometry visit for glaucoma screening and checkup Recommended yearly dental visit for hygiene and checkup  Vaccinations: Influenza vaccine: 05/27/2021 Pneumococcal vaccine: 09/16/2008, 11/27/2013 Tdap vaccine: 02/22/2019; due every 10 years Shingles vaccine: no record per [facility]   Covid-19: 01/02/2020, 01/23/2020, 09/09/2020  Advanced directives: Yes; patient brought in copy to be scanned into chart.  Conditions/risks identified: Yes; Client understands the importance of follow-up with providers by attending scheduled visits and discussed goals to eat healthier, increase physical activity, exercise the brain, socialize more, get enough sleep and make time for laughter.  Next appointment: Please schedule your next Medicare Wellness Visit with your Nurse Health Advisor in 1 year by calling [phone].   Preventive Care 16 Years and Older, Female Preventive care refers to lifestyle choices and visits with your health care provider that can promote health and wellness. What does preventive care include? A yearly physical exam. This is also called an annual well check. Dental exams once or twice a year. Routine eye exams. Ask your health care provider how often you should have your eyes checked. Personal lifestyle choices, including: Daily care of your teeth and gums. Regular physical activity. Eating a healthy diet. Avoiding tobacco and drug use. Limiting alcohol use. Practicing safe sex. Taking low-dose aspirin every  day. Taking vitamin and mineral supplements as recommended by your health care provider. What happens during an annual well check? The services and screenings done by your health care provider during your annual well check will depend on your age, overall health, lifestyle risk factors, and family history of disease. Counseling  Your health care provider may ask you questions about your: Alcohol use. Tobacco use. Drug use. Emotional well-being. Home and relationship well-being. Sexual activity. Eating habits. History of falls. Memory and ability to understand (cognition). Work and work Astronomer. Reproductive health. Screening  You may have the following tests or measurements: Height, weight, and BMI. Blood pressure. Lipid and cholesterol levels. These may be checked every 5 years, or more frequently if you are over 79 years old. Skin check. Lung cancer screening. You may have this screening every year starting at age 52 if you have a 30-pack-year history of smoking and currently smoke or have quit within the past 15 years. Fecal occult blood test (FOBT) of the stool. You may have this test every year starting at age 80. Flexible sigmoidoscopy or colonoscopy. You may have a sigmoidoscopy every 5 years or a colonoscopy every 10 years starting at age 93. Hepatitis C blood test. Hepatitis B blood test. Sexually transmitted disease (STD) testing. Diabetes screening. This is done by checking your blood sugar (glucose) after you have not eaten for a while (fasting). You may have this done every 1-3 years. Bone density scan. This is done to screen for osteoporosis. You may have this done starting at age 79. Mammogram. This may be done every 1-2 years. Talk to your health care provider about how often you should have regular mammograms. Talk with your health care provider about your test results, treatment options, and if necessary, the need for  more tests. Vaccines  Your health care  provider may recommend certain vaccines, such as: Influenza vaccine. This is recommended every year. Tetanus, diphtheria, and acellular pertussis (Tdap, Td) vaccine. You may need a Td booster every 10 years. Zoster vaccine. You may need this after age 62. Pneumococcal 13-valent conjugate (PCV13) vaccine. One dose is recommended after age 56. Pneumococcal polysaccharide (PPSV23) vaccine. One dose is recommended after age 44. Talk to your health care provider about which screenings and vaccines you need and how often you need them. This information is not intended to replace advice given to you by your health care provider. Make sure you discuss any questions you have with your health care provider. Document Released: 09/11/2015 Document Revised: 05/04/2016 Document Reviewed: 06/16/2015 Elsevier Interactive Patient Education  2017 [employer] Prevention in the Home Falls can cause injuries. They can happen to people of all ages. There are many things you can do to make your home safe and to help prevent falls. What can I do on the outside of my home? Regularly fix the edges of walkways and driveways and fix any cracks. Remove anything that might make you trip as you walk through a door, such as a raised step or threshold. Trim any bushes or trees on the path to your home. Use bright outdoor lighting. Clear any walking paths of anything that might make someone trip, such as rocks or tools. Regularly check to see if handrails are loose or broken. Make sure that both sides of any steps have handrails. Any raised decks and porches should have guardrails on the edges. Have any leaves, snow, or ice cleared regularly. Use sand or salt on walking paths during winter. Clean up any spills in your garage right away. This includes oil or grease spills. What can I do in the bathroom? Use night lights. Install grab bars by the toilet and in the tub and shower. Do not use towel bars as grab  bars. Use non-skid mats or decals in the tub or shower. If you need to sit down in the shower, use a plastic, non-slip stool. Keep the floor dry. Clean up any water that spills on the floor as soon as it happens. Remove soap buildup in the tub or shower regularly. Attach bath mats securely with double-sided non-slip rug tape. Do not have throw rugs and other things on the floor that can make you trip. What can I do in the bedroom? Use night lights. Make sure that you have a light by your bed that is easy to reach. Do not use any sheets or blankets that are too big for your bed. They should not hang down onto the floor. Have a firm chair that has side arms. You can use this for support while you get dressed. Do not have throw rugs and other things on the floor that can make you trip. What can I do in the kitchen? Clean up any spills right away. Avoid walking on wet floors. Keep items that you use a lot in easy-to-reach places. If you need to reach something above you, use a strong step stool that has a grab bar. Keep electrical cords out of the way. Do not use floor polish or wax that makes floors slippery. If you must use wax, use non-skid floor wax. Do not have throw rugs and other things on the floor that can make you trip. What can I do with my stairs? Do not leave any items on the stairs. Make  sure that there are handrails on both sides of the stairs and use them. Fix handrails that are broken or loose. Make sure that handrails are as long as the stairways. Check any carpeting to make sure that it is firmly attached to the stairs. Fix any carpet that is loose or worn. Avoid having throw rugs at the top or bottom of the stairs. If you do have throw rugs, attach them to the floor with carpet tape. Make sure that you have a light switch at the top of the stairs and the bottom of the stairs. If you do not have them, ask someone to add them for you. What else can I do to help prevent  falls? Wear shoes that: Do not have high heels. Have rubber bottoms. Are comfortable and fit you well. Are closed at the toe. Do not wear sandals. If you use a stepladder: Make sure that it is fully opened. Do not climb a closed stepladder. Make sure that both sides of the stepladder are locked into place. Ask someone to hold it for you, if possible. Clearly mark and make sure that you can see: Any grab bars or handrails. First and last steps. Where the edge of each step is. Use tools that help you move around (mobility aids) if they are needed. These include: Canes. Walkers. Scooters. Crutches. Turn on the lights when you go into a dark area. Replace any light bulbs as soon as they burn out. Set up your furniture so you have a clear path. Avoid moving your furniture around. If any of your floors are uneven, fix them. If there are any pets around you, be aware of where they are. Review your medicines with your doctor. Some medicines can make you feel dizzy. This can increase your chance of falling. Ask your doctor what other things that you can do to help prevent falls. This information is not intended to replace advice given to you by your health care provider. Make sure you discuss any questions you have with your health care provider. Document Released: 06/11/2009 Document Revised: 01/21/2016 Document Reviewed: 09/19/2014 Elsevier Interactive Patient Education  2017 [employer].

## 2021-06-04 NOTE — Progress Notes (Addendum)
I connected with Sierra Robbins today by telephone and verified that I am speaking with the correct person using two identifiers. Location patient: home Location provider: work Persons participating in the virtual visit: patient, provider.   I discussed the limitations, risks, security and privacy concerns of performing an evaluation and management service by telephone and the availability of in person appointments. I also discussed with the patient that there may be a patient responsible charge related to this service. The patient expressed understanding and verbally consented to this telephonic visit.    Interactive audio and video telecommunications were attempted between this provider and patient, however failed, due to patient having technical difficulties OR patient did not have access to video capability.  We continued and completed visit with audio only.  Some vital signs may be absent or patient reported.   Time Spent with patient on telephone encounter: 40 minutes  Subjective:   Sierra Robbins is a 86 y.o. female who presents for Medicare Annual (Subsequent) preventive examination.  Review of Systems     Cardiac Risk Factors include: advanced age (>53men, >47 women);dyslipidemia;hypertension;family history of premature cardiovascular disease     Objective:    There were no vitals filed for this visit. There is no height or weight on file to calculate BMI.  Advanced Directives 06/04/2021 02/25/2020 11/08/2019  Does Patient Have a Medical Advance Directive? Yes Yes No  Type of Advance Directive Living will;Healthcare Power of [employer] Power of [place];Living will -  Does patient want to make changes to medical advance directive? No - Patient declined No - Patient declined -  Copy of Healthcare Power of Attorney in Chart? - No - copy requested -  Would patient like information on creating a medical advance directive? - - No - Patient declined    Current  Medications (verified) Outpatient Encounter Medications as of 06/04/2021  Medication Sig   acetaminophen (TYLENOL) 325 MG tablet Take 162.5 mg by mouth every 6 (six) hours as needed for headache (pain).   albuterol (PROAIR HFA) 108 (90 Base) MCG/ACT inhaler inhale 2 puffs by mouth every 4 hours if needed for wheezing and shortness of breath   amitriptyline (ELAVIL) 25 MG tablet Take 0.5 tablets (12.5 mg total) by mouth at bedtime as needed for sleep. (Patient taking differently: Take 12 mg by mouth at bedtime as needed (pain/itching/anxiety from shingles).)   amoxicillin (AMOXIL) 500 MG tablet Take 1 tablet (500 mg total) by mouth 2 (two) times daily.   Ascorbic Acid (VITAMIN C PO) Take 0.5 mg by mouth See admin instructions. Take 1/2 tablet by mouth three to four times monthly.   aspirin EC 81 MG tablet Take 81 mg by mouth daily with breakfast.   Cod Liver Oil 1000 MG CAPS Take 1,000 mg by mouth daily with breakfast.   diphenhydrAMINE (BENADRYL) 25 MG tablet Take 12.5 mg by mouth every 6 (six) hours as needed for itching.   fluticasone (FLONASE) 50 MCG/ACT nasal spray Place 1 spray into both nostrils daily as needed for allergies or rhinitis.   hydrochlorothiazide (HYDRODIURIL) 25 MG tablet TAKE 1 TABLETS BY MOUTH DAILY. APPT NEEDED FOR FUTURE REFILLS   hydrocortisone cream 1 % Apply 1 application topically 2 (two) times daily as needed for itching.   ibuprofen (ADVIL) 200 MG tablet Take 200 mg by mouth every 6 (six) hours as needed for headache (pain).   Multiple Vitamin (MULTIVITAMIN WITH MINERALS) TABS tablet Take 1 tablet by mouth daily with breakfast. Centrum A-Z  vitamin E 400 UNIT capsule Take 400 Units by mouth daily with breakfast.    No facility-administered encounter medications on file as of 06/04/2021.    Allergies (verified) Alendronate sodium, Atorvastatin, Ibandronate sodium, Prednisone, Ramipril, and Statins   History: Past Medical History:  Diagnosis Date   Allergic  rhinitis    Asthma    Barrett's esophagus    COPD (chronic obstructive pulmonary disease) (HCC)    DJD (degenerative joint disease)    hand   Endometriosis    GERD (gastroesophageal reflux disease)    History of shingles    Left upper arm/axilla   Hyperlipidemia    Hypertension    Osteoporosis    Vitamin D deficiency    Past Surgical History:  Procedure Laterality Date   ABDOMINAL HYSTERECTOMY  1979   APPENDECTOMY     CHOLECYSTECTOMY  1995   NASAL SEPTUM SURGERY     TRACHEOSTOMY     s/p temp; at 86 yo for croup   VESICOVAGINAL FISTULA CLOSURE W/ TAH     Family History  Problem Relation Age of Onset   Stroke Father    Heart attack Mother    Asthma Sister    Stomach cancer Maternal Grandfather    Rheum arthritis Sister    Rheum arthritis Sister    Social History   Socioeconomic History   Marital status: Divorced    Spouse name: Not on file   Number of children: 1   Years of education: Not on file   Highest education level: Not on file  Occupational History   Occupation: [employer]IONS-[employer]    Employer: [employer]  Tobacco Use   Smoking status: Never   Smokeless tobacco: Never  Vaping Use   Vaping Use: Never used  Substance and Sexual Activity   Alcohol use: Yes    Comment: during the holidays   Drug use: No   Sexual activity: Not Currently  Other Topics Concern   Not on file  Social History Narrative   Not on file   Social Determinants of Health   Financial Resource Strain: Low Risk    Difficulty of Paying Living Expenses: Not hard at all  Food Insecurity: No Food Insecurity   Worried About Programme researcher, broadcasting/film/video in the Last Year: Never true   Ran Out of Food in the Last Year: Never true  Transportation Needs: No Transportation Needs   Lack of Transportation (Medical): No   Lack of Transportation (Non-Medical): No  Physical Activity: Sufficiently Active   Days of Exercise per Week: 5 days   Minutes of Exercise per Session: 30 min  Stress: No  Stress Concern Present   Feeling of Stress : Not at all  Social Connections: Moderately Integrated   Frequency of Communication with Friends and Family: More than three times a week   Frequency of Social Gatherings with Friends and Family: More than three times a week   Attends Religious Services: More than 4 times per year   Active Member of [employer] or Organizations: Yes   Attends Banker Meetings: 1 to 4 times per year   Marital Status: Widowed    Tobacco Counseling Counseling given: Not Answered   Clinical Intake:  Pre-visit preparation completed: Yes  Pain : No/denies pain     Diabetes: No  How often do you need to have someone help you when you read instructions, pamphlets, or other written materials from your doctor or pharmacy?: 1 - Never  Diabetic? no  Interpreter  Needed?: No  Information entered by :: Susie Cassette, LPN   Activities of Daily Living In your present state of health, do you have any difficulty performing the following activities: 06/04/2021 05/27/2021  Hearing? N N  Vision? N N  Difficulty concentrating or making decisions? N N  Walking or climbing stairs? N N  Dressing or bathing? N N  Doing errands, shopping? N N  Preparing Food and eating ? N -  Using the Toilet? N -  In the past six months, have you accidently leaked urine? N -  Do you have problems with loss of bowel control? N -  Managing your Medications? N -  Managing your Finances? N -  Housekeeping or managing your Housekeeping? N -  Some recent data might be hidden    Patient Care Team: Corwin Levins, MD as PCP - General  Indicate any recent Medical Services you may have received from other than [facility] providers in the past year (date may be approximate).     Assessment:   This is a routine wellness examination for [facility].  Hearing/Vision screen Hearing Screening - Comments:: Patient denied any hearing difficulty.   No hearing aids.  Vision Screening -  Comments:: Patient wears corrective glasses/contacts.  Eye exam done annually by: [facility].  Dietary issues and exercise activities discussed: Current Exercise Habits: Home exercise routine, Type of exercise: walking, Time (Minutes): 30, Frequency (Times/Week): 5, Weekly Exercise (Minutes/Week): 150, Intensity: Moderate, Exercise limited by: None identified   Goals Addressed             This Visit's Progress    Patient Stated       To maintain my current health status by continuing to eat healthy, stay physically active and socially active.      Depression Screen PHQ 2/9 Scores 06/04/2021 05/27/2021 11/24/2020 05/27/2020 02/25/2020 02/22/2019 12/13/2017  PHQ - 2 Score 0 0 0 0 0 0 0    Fall Risk Fall Risk  06/04/2021 05/27/2021 11/24/2020 05/27/2020 02/25/2020  Falls in the past year? 0 0 0 0 0  Number falls in past yr: 0 0 - - 0  Injury with Fall? 0 0 - - 0  Risk for fall due to : No Fall Risks - - - No Fall Risks  Follow up Falls evaluation completed - - - Falls evaluation completed;Education provided    FALL RISK PREVENTION PERTAINING TO THE HOME:  Any stairs in or around the home? No  If so, are there any without handrails? No  Home free of loose throw rugs in walkways, pet beds, electrical cords, etc? Yes  Adequate lighting in your home to reduce risk of falls? Yes   ASSISTIVE DEVICES UTILIZED TO PREVENT FALLS:  Life alert? No  Use of a cane, walker or w/c? No  Grab bars in the bathroom? Yes  Shower chair or bench in shower? No  Elevated toilet seat or a handicapped toilet? No   TIMED UP AND GO:  Was the test performed? No .  Length of time to ambulate 10 feet: n/a sec.   Gait steady and fast without use of assistive device  Cognitive Function: Normal cognitive status assessed by direct observation by this Nurse Health Advisor. No abnormalities found.   MMSE - Mini Mental State Exam 02/25/2020  Not completed: Refused         Immunizations Immunization History  Administered Date(s) Administered   Fluad Quad(high Dose 65+) 06/13/2019, 05/27/2020, 05/27/2021   H1N1 07/29/2008   Influenza  Split 05/29/2012, 05/25/2013, 05/29/2014   Influenza Whole 06/08/2007, 04/29/2008, 06/29/2010, 05/30/2011   Influenza, High Dose Seasonal PF 05/12/2016, 05/25/2017, 05/28/2018   Influenza-Unspecified 05/24/2015   PFIZER(Purple Top)SARS-COV-2 Vaccination 01/02/2020, 01/23/2020, 09/09/2020   Pneumococcal Conjugate-13 11/27/2013   Pneumococcal Polysaccharide-23 07/01/2002, 09/16/2008   Td 09/16/2008   Tdap 02/22/2019    TDAP status: Up to date  Flu Vaccine status: Up to date  Pneumococcal vaccine status: Up to date  Covid-19 vaccine status: Completed vaccines  Qualifies for Shingles Vaccine? Yes   Zostavax completed No   Shingrix Completed?: No.    Education has been provided regarding the importance of this vaccine. Patient has been advised to call insurance company to determine out of pocket expense if they have not yet received this vaccine. Advised may also receive vaccine at local pharmacy or Health Dept. Verbalized acceptance and understanding.  Screening Tests Health Maintenance  Topic Date Due   COVID-19 Vaccine (4 - Booster for Pfizer series) 06/12/2021 (Originally 01/07/2021)   Zoster Vaccines- Shingrix (1 of 2) 08/26/2021 (Originally 06/08/1987)   TETANUS/TDAP  02/21/2029   INFLUENZA VACCINE  Completed   DEXA SCAN  Completed   HPV VACCINES  Aged Out    Health Maintenance  There are no preventive care reminders to display for this patient.  Colorectal cancer screening: No longer required.   Mammogram status: No longer required due to age.  Bone Density status: No longer required due to age.  Lung Cancer Screening: (Low Dose CT Chest recommended if Age 70-80 years, 30 pack-year currently smoking OR have quit w/in 15years.) does not qualify.   Lung Cancer Screening Referral: no  Additional  Screening:  Hepatitis C Screening: does not qualify; Completed: no  Vision Screening: Recommended annual ophthalmology exams for early detection of glaucoma and other disorders of the eye. Is the patient up to date with their annual eye exam?  Yes  Who is the provider or what is the name of the office in which the patient attends annual eye exams? [facility] If pt is not established with a provider, would they like to be referred to a provider to establish care? No .   Dental Screening: Recommended annual dental exams for proper oral hygiene  Community Resource Referral / Chronic Care Management: CRR required this visit?  No   CCM required this visit?  No      Plan:     I have personally reviewed and noted the following in the patient's chart:   Medical and social history Use of alcohol, tobacco or illicit drugs  Current medications and supplements including opioid prescriptions.  Functional ability and status Nutritional status Physical activity Advanced directives List of other physicians Hospitalizations, surgeries, and ER visits in previous 12 months Vitals Screenings to include cognitive, depression, and falls Referrals and appointments  In addition, I have reviewed and discussed with patient certain preventive protocols, quality metrics, and best practice recommendations. A written personalized care plan for preventive services as well as general preventive health recommendations were provided to patient.     Mickeal Needy, LPN   64/11/345   Nurse Notes:  Patient is cogitatively intact. There were no vitals filed for this visit. There is no height or weight on file to calculate BMI. Patient stated that she has no issues with gait or balance; does not use any assistive devices. Medications reviewed with patient; no opioid use noted. Hearing Screening - Comments:: Patient denied any hearing difficulty.   No hearing aids.  Vision Screening -  Comments:: Patient wears corrective glasses/contacts.  Eye exam done annually by: [facility].      Medical screening examination/treatment/procedure(s) were performed by non-physician practitioner and as supervising physician I was immediately available for consultation/collaboration.  I agree with above. Oliver Barre, MD

## 2021-06-17 ENCOUNTER — Other Ambulatory Visit: Payer: Self-pay | Admitting: Internal Medicine

## 2021-06-17 NOTE — Telephone Encounter (Signed)
Please refill as per office routine med refill policy (all routine meds to be refilled for 3 mo or monthly (per pt preference) up to one year from last visit, then month to month grace period for 3 mo, then further med refills will have to be denied) ? ?

## 2021-07-12 NOTE — Progress Notes (Signed)
   I, Christoper Fabian, LAT, ATC, am serving as scribe for Dr. Clementeen Graham.  Sierra Robbins is a 86 y.o. female who presents to [facility] at [facility] today for f/u B knee pain, L>R, due to mod-severd medial compartment DJD.  She was last seen by Dr. Denyse Amass on 06/01/21 and had a L knee steroid injection.  She was also shown a HEP and advised to use Voltaren gel.  Today, pt reports she has been using the Diclofenac gel and it's been very beneficial. Pt has been working on HEP and bilat knees are doing pretty well overall and notes slight swelling.  Diagnostic testing: L knee XR- 06/01/21  Pertinent review of systems: No fevers or chills  Relevant historical information: Hypertension   Exam:  BP (!) 162/100   Pulse 73   Ht 5' (1.524 m)   Wt 146 lb 9.6 oz (66.5 kg)   SpO2 97%   BMI 28.63 kg/m  General: Well Developed, well nourished, and in no acute distress.   MSK: Left knee normal-appearing normal motion with crepitation.  Mildly tender palpation medial joint line. Right knee normal motion.  Mild tender diffusely.    Lab and Radiology Results  EXAM: LEFT KNEE 3 VIEWS   COMPARISON:  None.   FINDINGS: There is no acute fracture or dislocation. The bones are osteopenic. There is arthritic changes with tricompartmental narrowing and severe narrowing of the medial compartment. Trace suprapatellar effusion. The soft tissues are unremarkable.   IMPRESSION: 1. No acute fracture or dislocation. 2. Osteoarthritis.     Electronically Signed   By: Elgie Collard M.D.   On: 06/01/2021 16:02   I, Clementeen Graham, personally (independently) visualized and performed the interpretation of the images attached in this note.     Assessment and Plan: 86 y.o. female with left knee pain due to medial compartment DJD and some patellofemoral DJD.  Patient did well about 6 weeks ago with a steroid injection.  She is also doing quad strengthening exercises at home and using  Voltaren gel and is effectively pain-free.  She has some right knee pain as well and is also treating that with Voltaren gel and home exercise program. Discuss potential treatment plan and options as well as future plans and knee replacement.  She is doing well.  Continue current regimen and check back as needed.  Can repeat steroid injection every 3 months and if needed could proceed to hyaluronic acid injection.  Recheck as needed.    Discussed warning signs or symptoms. Please see discharge instructions. Patient expresses understanding.   The above documentation has been reviewed and is accurate and complete Clementeen Graham, M.D.  Total encounter time 20 minutes including face-to-face time with the patient and, reviewing past medical record, and charting on the date of service.   As above treatment plan and options

## 2021-07-13 ENCOUNTER — Other Ambulatory Visit: Payer: Self-pay

## 2021-07-13 ENCOUNTER — Ambulatory Visit (INDEPENDENT_AMBULATORY_CARE_PROVIDER_SITE_OTHER): Payer: Medicare Other | Admitting: Family Medicine

## 2021-07-13 VITALS — BP 162/100 | HR 73 | Ht 60.0 in | Wt 146.6 lb

## 2021-07-13 DIAGNOSIS — G8929 Other chronic pain: Secondary | ICD-10-CM

## 2021-07-13 DIAGNOSIS — M1712 Unilateral primary osteoarthritis, left knee: Secondary | ICD-10-CM

## 2021-07-13 DIAGNOSIS — M25562 Pain in left knee: Secondary | ICD-10-CM

## 2021-07-13 NOTE — Patient Instructions (Addendum)
Thank you for coming in today.   Keep using the Diclofenac gel and working on the exercises at home.  Recheck as needed

## 2021-09-22 ENCOUNTER — Ambulatory Visit: Payer: Medicare Other | Admitting: Internal Medicine

## 2021-09-23 ENCOUNTER — Ambulatory Visit: Payer: Medicare Other | Admitting: Internal Medicine

## 2021-09-29 NOTE — Progress Notes (Signed)
HPI female never smoker followed for allergic rhinitis, asthma, complicated by GERD/Barrett's, HBP Office Spirometry 09/13/2016-moderate airway obstruction. FVC 1.71/79%, FEV1 1.08/67%, ratio 0.63, FEF 25-75 percent 0.49/40  --------------------------------------------------------------------------------     09/22/20- 87 year old female never smoker followed for allergic Rhinitis, Asthma, complicated by GERD/Barrett's, HTN, ------f/u mild persistent asthma  Proair hfa, Breo 100?, Nasacort Covid vax-3 Phizer Flu vax-had Took Medrol dosepak in Sept for [place] sting -----Patient states that she is feeling great, has hardly used her rescue inhaler, Occasional productive cough with clear sputum. No concerns. ACT- 23 Very pleased with status this year. Never picked up Breo sample- never felt need. Rarely uses rescue hfa. Asks refills Proair and amoxacillin to hold. Describes large local reaction to yellow jacket sting through shirt while mowing lawn last summer.   09/30/21-  87 year old female never smoker followed for allergic Rhinitis, Asthma, complicated by GERD/Barrett's, HTN, Arthritis,  ------f/u mild persistent asthma  -Proair hfa, Nasacort Covid vax-3 Phizer Flu vax-had -----Patient feels like she is doing good overall, states that sometimes she has to clear throat more than usual.  ACT score- 23 Only needed rescue inhaler 4 times in past year. No longer has maintenance inhaler. She feels very well controlled.  Sometimes in the winter months may note a little more throat clearing but nothing specific.  She is not using a steroid inhaler.  She was quite chatty today about the good life she has led.  Lives with daughter.  ROS-see HPI  + = positive Constitutional:   No-   weight loss, night sweats, fevers, chills, fatigue, lassitude. HEENT:   + headaches, no- difficulty swallowing, tooth/dental problems, sore throat,       +sneezing, no-itching, ear ache, +nasal congestion, + post nasal  drip,  CV:  No-   chest pain, orthopnea, PND, swelling in lower extremities, anasarca, dizziness, palpitations Resp: No-   shortness of breath with exertion or at rest.              No-   productive cough,  No non-productive cough,  No- coughing up of blood.              No-   change in color of mucus.  + wheezing.   Skin: No-   rash or lesions. GI:  No-   heartburn, indigestion, abdominal pain, nausea, vomiting,  GU: MS:  No-   joint pain or swelling.   Neuro-     nothing unusual Psych:  No- change in mood or affect. No depression or anxiety.  No memory loss.  OBJ General- Alert, Oriented, Affect-appropriate, Distress- none acute Skin- rash-none, lesions- none, excoriation- none Lymphadenopathy- none Head- atraumatic            Eyes- Gross vision intact, PERRLA, conjunctivae clear secretions            Ears- Hearing, canals-normal            Nose- , no-Septal dev, mucus, polyps, erosion, perforation ,             Throat- Mallampati III , mucosa clear , drainage- none, tonsils- atrophic.                     + Dentures Neck- flexible , old sternal scar ( surgical complication of ?thyroid surgery?) , no stridor , thyroid nl, carotid no bruit Chest - symmetrical excursion , unlabored           Heart/CV- RRR , no murmur , no gallop  ,  no rub, nl s1 s2                           - JVD- none , edema- none, stasis changes- none, varices- none           Lung- clear to P&A, wheeze- none, cough- none , dullness-none, rub- none           Chest wall- sternotomy scar Abd- Br/ Gen/ Rectal- Not done, not indicated Extrem- cyanosis- none, clubbing, none, atrophy- none, strength- nl Neuro- grossly intact to observation

## 2021-09-30 ENCOUNTER — Encounter: Payer: Self-pay | Admitting: Internal Medicine

## 2021-09-30 ENCOUNTER — Other Ambulatory Visit: Payer: Self-pay

## 2021-09-30 ENCOUNTER — Ambulatory Visit: Payer: Medicare Other | Admitting: Internal Medicine

## 2021-09-30 DIAGNOSIS — K2100 Gastro-esophageal reflux disease with esophagitis, without bleeding: Secondary | ICD-10-CM | POA: Diagnosis not present

## 2021-09-30 DIAGNOSIS — J4530 Mild persistent asthma, uncomplicated: Secondary | ICD-10-CM

## 2021-09-30 NOTE — Assessment & Plan Note (Signed)
Mild intermittent uncomplicated.  Currently doing very well with only occasional use of rescue inhaler. Plan-she will call for refills when needed.

## 2021-09-30 NOTE — Assessment & Plan Note (Signed)
She understands to continue reflux precautions.

## 2021-09-30 NOTE — Patient Instructions (Signed)
Glad you are doing so well. Please call if we can help 

## 2021-10-18 ENCOUNTER — Other Ambulatory Visit: Payer: Self-pay | Admitting: Internal Medicine

## 2021-10-18 NOTE — Telephone Encounter (Signed)
Please refill as per office routine med refill policy (all routine meds to be refilled for 3 mo or monthly (per pt preference) up to one year from last visit, then month to month grace period for 3 mo, then further med refills will have to be denied) ? ?

## 2021-11-19 ENCOUNTER — Other Ambulatory Visit (INDEPENDENT_AMBULATORY_CARE_PROVIDER_SITE_OTHER): Payer: Medicare Other

## 2021-11-19 ENCOUNTER — Other Ambulatory Visit: Payer: Self-pay

## 2021-11-19 DIAGNOSIS — E559 Vitamin D deficiency, unspecified: Secondary | ICD-10-CM | POA: Diagnosis not present

## 2021-11-19 DIAGNOSIS — E538 Deficiency of other specified B group vitamins: Secondary | ICD-10-CM | POA: Diagnosis not present

## 2021-11-19 DIAGNOSIS — E785 Hyperlipidemia, unspecified: Secondary | ICD-10-CM

## 2021-11-19 LAB — URINALYSIS, ROUTINE W REFLEX MICROSCOPIC
Bilirubin Urine: NEGATIVE
Hgb urine dipstick: NEGATIVE
Ketones, ur: NEGATIVE
Nitrite: NEGATIVE
Specific Gravity, Urine: 1.02 (ref 1.000–1.030)
Total Protein, Urine: NEGATIVE
Urine Glucose: NEGATIVE
Urobilinogen, UA: 0.2 (ref 0.0–1.0)
pH: 5.5 (ref 5.0–8.0)

## 2021-11-19 LAB — CBC WITH DIFFERENTIAL/PLATELET
Basophils Absolute: 0.1 10*3/uL (ref 0.0–0.1)
Basophils Relative: 1.9 % (ref 0.0–3.0)
Eosinophils Absolute: 0.4 10*3/uL (ref 0.0–0.7)
Eosinophils Relative: 8.3 % — ABNORMAL HIGH (ref 0.0–5.0)
HCT: 40.3 % (ref 36.0–46.0)
Hemoglobin: 13.9 g/dL (ref 12.0–15.0)
Lymphocytes Relative: 36.4 % (ref 12.0–46.0)
Lymphs Abs: 1.9 10*3/uL (ref 0.7–4.0)
MCHC: 34.5 g/dL (ref 30.0–36.0)
MCV: 101.8 fl — ABNORMAL HIGH (ref 78.0–100.0)
Monocytes Absolute: 0.5 10*3/uL (ref 0.1–1.0)
Monocytes Relative: 9.3 % (ref 3.0–12.0)
Neutro Abs: 2.3 10*3/uL (ref 1.4–7.7)
Neutrophils Relative %: 44.1 % (ref 43.0–77.0)
Platelets: 237 10*3/uL (ref 150.0–400.0)
RBC: 3.95 Mil/uL (ref 3.87–5.11)
RDW: 12.6 % (ref 11.5–15.5)
WBC: 5.2 10*3/uL (ref 4.0–10.5)

## 2021-11-19 LAB — LIPID PANEL
Cholesterol: 189 mg/dL (ref 0–200)
HDL: 47.6 mg/dL (ref >39.00)
NonHDL: 141.6
Total CHOL/HDL Ratio: 4
Triglycerides: 219 mg/dL — ABNORMAL HIGH (ref 0.0–149.0)
VLDL: 43.8 mg/dL — ABNORMAL HIGH (ref 0.0–40.0)

## 2021-11-19 LAB — HEPATIC FUNCTION PANEL
ALT: 15 U/L (ref 0–35)
AST: 23 U/L (ref 0–37)
Albumin: 4.4 g/dL (ref 3.5–5.2)
Alkaline Phosphatase: 59 U/L (ref 39–117)
Bilirubin, Direct: 0.1 mg/dL (ref 0.0–0.3)
Total Bilirubin: 0.9 mg/dL (ref 0.2–1.2)
Total Protein: 7.2 g/dL (ref 6.0–8.3)

## 2021-11-19 LAB — BASIC METABOLIC PANEL
BUN: 12 mg/dL (ref 6–23)
CO2: 35 mEq/L — ABNORMAL HIGH (ref 19–32)
Calcium: 9.2 mg/dL (ref 8.4–10.5)
Chloride: 101 mEq/L (ref 96–112)
Creatinine, Ser: 0.54 mg/dL (ref 0.40–1.20)
GFR: 84.53 mL/min (ref >60.00)
Glucose, Bld: 103 mg/dL — ABNORMAL HIGH (ref 70–99)
Potassium: 3.7 mEq/L (ref 3.5–5.1)
Sodium: 141 mEq/L (ref 135–145)

## 2021-11-19 LAB — LDL CHOLESTEROL, DIRECT: Direct LDL: 113 mg/dL

## 2021-11-23 LAB — TSH: TSH: 2.29 u[IU]/mL (ref 0.35–5.50)

## 2021-11-23 LAB — VITAMIN D 25 HYDROXY (VIT D DEFICIENCY, FRACTURES): VITD: 34.1 ng/mL (ref 30.00–100.00)

## 2021-11-23 LAB — VITAMIN B12: Vitamin B-12: 239 pg/mL (ref 211–911)

## 2021-11-24 ENCOUNTER — Encounter: Payer: Self-pay | Admitting: Internal Medicine

## 2021-11-24 ENCOUNTER — Ambulatory Visit (INDEPENDENT_AMBULATORY_CARE_PROVIDER_SITE_OTHER): Payer: Medicare Other | Admitting: Internal Medicine

## 2021-11-24 VITALS — BP 178/70 | HR 76 | Temp 98.7°F | Ht 60.0 in | Wt 146.0 lb

## 2021-11-24 DIAGNOSIS — E538 Deficiency of other specified B group vitamins: Secondary | ICD-10-CM | POA: Diagnosis not present

## 2021-11-24 DIAGNOSIS — Z0001 Encounter for general adult medical examination with abnormal findings: Secondary | ICD-10-CM | POA: Diagnosis not present

## 2021-11-24 DIAGNOSIS — E559 Vitamin D deficiency, unspecified: Secondary | ICD-10-CM

## 2021-11-24 DIAGNOSIS — I10 Essential (primary) hypertension: Secondary | ICD-10-CM | POA: Diagnosis not present

## 2021-11-24 DIAGNOSIS — H905 Unspecified sensorineural hearing loss: Secondary | ICD-10-CM | POA: Diagnosis not present

## 2021-11-24 DIAGNOSIS — E7800 Pure hypercholesterolemia, unspecified: Secondary | ICD-10-CM

## 2021-11-24 DIAGNOSIS — R739 Hyperglycemia, unspecified: Secondary | ICD-10-CM

## 2021-11-24 MED ORDER — AMITRIPTYLINE HCL 25 MG PO TABS: 12.0000 mg | ORAL_TABLET | Freq: Every evening | ORAL | 1 refills | Status: AC | PRN

## 2021-11-24 MED ORDER — LOSARTAN POTASSIUM 50 MG PO TABS: 50.0000 mg | ORAL_TABLET | Freq: Every day | ORAL | 3 refills | Status: DC

## 2021-11-24 MED ORDER — HYDROCHLOROTHIAZIDE 25 MG PO TABS: ORAL_TABLET | ORAL | 3 refills | Status: DC

## 2021-11-24 MED ORDER — CHOLECALCIFEROL 50 MCG (2000 UT) PO TABS: ORAL_TABLET | ORAL | 99 refills | Status: AC

## 2021-11-24 NOTE — Assessment & Plan Note (Addendum)
Last vitamin D ?Lab Results  ?Component Value Date  ? VD25OH 34.10 11/19/2021  ? ?stil mild low, to increased oral replacement 2000 qd ? ?

## 2021-11-24 NOTE — Progress Notes (Addendum)
Patient ID: Sierra Robbins, female   DOB: 05/12/1935, 87 y.o.   MRN: [id number] ? ? ? ?     Chief Complaint:: wellness exam and Follow-up ? Low vit d and b12, hld, hyperglycemia, right hearing loss ? ?     HPI:  Sierra Robbins is a 87 y.o. female here for wellness exam; declines shingrix, o/w up to date ?         ?              Also BP at home qod is typically on average 138/70's.  Pt denies chest pain, increased sob or doe, wheezing, orthopnea, PND, increased LE swelling, palpitations, dizziness or syncope.   Pt denies polydipsia, polyuria, or new focal neuro s/s.    Pt denies fever, wt loss, night sweats, loss of appetite, or other constitutional symptoms  Taking Vit D 1000.  Has some nausea with a particular brand of B12, plans to try a different brand rather than shots.  Trying to follow lower chol diet, declines statin.  Has  also had mild worsening right hearing loss in the past 6 months with HA, pain, tinnitus or vertigo.   ?  ?Wt Readings from Last 3 Encounters:  ?11/24/21 146 lb (66.2 kg)  ?09/30/21 147 lb 9.6 oz (67 kg)  ?07/13/21 146 lb 9.6 oz (66.5 kg)  ? ?BP Readings from Last 3 Encounters:  ?11/24/21 (!) 178/70  ?09/30/21 (!) 150/82  ?07/13/21 (!) 162/100  ? ?Immunization History  ?Administered Date(s) Administered  ? Fluad Quad(high Dose 65+) 06/13/2019, 05/27/2020, 05/27/2021  ? H1N1 07/29/2008  ? Influenza Split 05/29/2012, 05/25/2013, 05/29/2014  ? Influenza Whole 06/08/2007, 04/29/2008, 06/29/2010, 05/30/2011  ? Influenza, High Dose Seasonal PF 05/12/2016, 05/25/2017, 05/28/2018  ? Influenza-Unspecified 05/24/2015  ? PFIZER(Purple Top)SARS-COV-2 Vaccination 01/02/2020, 01/23/2020, 09/09/2020  ? Pneumococcal Conjugate-13 11/27/2013  ? Pneumococcal Polysaccharide-23 07/01/2002, 09/16/2008  ? Td 09/16/2008  ? Tdap 02/22/2019  ? ?There are no preventive care reminders to display for this patient. ? ?  ? ?Past Medical History:  ?Diagnosis Date  ? Allergic rhinitis   ? Asthma   ? Barrett's esophagus   ?  COPD (chronic obstructive pulmonary disease) ([place])   ? DJD (degenerative joint disease)   ? hand  ? Endometriosis   ? GERD (gastroesophageal reflux disease)   ? History of shingles   ? Left upper arm/axilla  ? Hyperlipidemia   ? Hypertension   ? Osteoporosis   ? Vitamin D deficiency   ? ?Past Surgical History:  ?Procedure Laterality Date  ? ABDOMINAL HYSTERECTOMY  1979  ? APPENDECTOMY    ? CHOLECYSTECTOMY  1995  ? NASAL SEPTUM SURGERY    ? TRACHEOSTOMY    ? s/p temp; at 87 yo for croup  ? VESICOVAGINAL FISTULA CLOSURE W/ TAH    ? ? reports that she has never smoked. She has never used smokeless tobacco. She reports current alcohol use. She reports that she does not use drugs. ?family history includes Asthma in her sister; Heart attack in her mother; Rheum arthritis in her sister and sister; Stomach cancer in her maternal grandfather; Stroke in her father. ?Allergies  ?Allergen Reactions  ? Alendronate Sodium Other (See Comments)  ?  REACTION: leg cramps  ? Atorvastatin Other (See Comments)  ?  Myalgia, weakness in legs and arms  ? Ibandronate Sodium Other (See Comments)  ?  REACTION: gi upset, and sluggish  ? Prednisone Swelling  ?  Mouth and tongue swelling  ? Ramipril  Swelling  ?  Throat, mouth, and lip swelling  ? Statins Other (See Comments)  ?  REACTION: myalgias; weakness in legs and arms  ? ?Current Outpatient Medications on File Prior to Visit  ?Medication Sig Dispense Refill  ? acetaminophen (TYLENOL) 325 MG tablet Take 162.5 mg by mouth every 6 (six) hours as needed for headache (pain).    ? albuterol (PROAIR HFA) 108 (90 Base) MCG/ACT inhaler inhale 2 puffs by mouth every 4 hours if needed for wheezing and shortness of breath 18 g PRN  ? Ascorbic Acid (VITAMIN C PO) Take 0.5 mg by mouth See admin instructions. Take 1/2 tablet by mouth three to four times monthly.    ? aspirin EC 81 MG tablet Take 81 mg by mouth daily with breakfast.    ? Cod Liver Oil 1000 MG CAPS Take 1,000 mg by mouth daily with  breakfast.    ? diphenhydrAMINE (BENADRYL) 25 MG tablet Take 12.5 mg by mouth every 6 (six) hours as needed for itching.    ? fluticasone (FLONASE) 50 MCG/ACT nasal spray Place 1 spray into both nostrils daily as needed for allergies or rhinitis.    ? hydrocortisone cream 1 % Apply 1 application topically 2 (two) times daily as needed for itching.    ? ibuprofen (ADVIL) 200 MG tablet Take 200 mg by mouth every 6 (six) hours as needed for headache (pain).    ? Multiple Vitamin (MULTIVITAMIN WITH MINERALS) TABS tablet Take 1 tablet by mouth daily with breakfast. Centrum A-Z    ? vitamin E 400 UNIT capsule Take 400 Units by mouth daily with breakfast.     ? ?No current facility-administered medications on file prior to visit.  ? ?     ROS:  All others reviewed and negative. ? ?Objective  ? ?     PE:  BP (!) 178/70 (BP Location: Right Arm, Patient Position: Sitting, Cuff Size: Normal)   Pulse 76   Temp 98.7 ?F (37.1 ?C) (Oral)   Ht 5' (1.524 m)   Wt 146 lb (66.2 kg)   SpO2 96%   BMI 28.51 kg/m?  ? ?              Constitutional: Pt appears in NAD ?              HENT: Head: NCAT.  ?              Right Ear: External ear normal.   ?              Left Ear: External ear normal.  ?              Eyes: . Pupils are equal, round, and reactive to light. Conjunctivae and EOM are normal ?              Nose: without d/c or deformity ?              Neck: Neck supple. Gross normal ROM ?              Cardiovascular: Normal rate and regular rhythm.   ?              Pulmonary/Chest: Effort normal and breath sounds without rales or wheezing.  ?              Abd:  Soft, NT, ND, + BS, no organomegaly ?              Neurological: Pt is  alert. At baseline orientation, motor grossly intact ?              Skin: Skin is warm. No rashes, no other new lesions, LE edema - none ?              Psychiatric: Pt behavior is normal without agitation  ? ?Micro: none ? ?Cardiac tracings I have personally interpreted today:  none ? ?Pertinent  Radiological findings (summarize): none  ? ?Lab Results  ?Component Value Date  ? WBC 5.2 11/19/2021  ? HGB 13.9 11/19/2021  ? HCT 40.3 11/19/2021  ? PLT 237.0 11/19/2021  ? GLUCOSE 103 (H) 11/19/2021  ? CHOL 189 11/19/2021  ? TRIG 219.0 (H) 11/19/2021  ? HDL 47.60 11/19/2021  ? LDLDIRECT 113.0 11/19/2021  ? [place] 104 (H) 11/24/2020  ? ALT 15 11/19/2021  ? AST 23 11/19/2021  ? NA 141 11/19/2021  ? K 3.7 11/19/2021  ? CL 101 11/19/2021  ? CREATININE 0.54 11/19/2021  ? BUN 12 11/19/2021  ? CO2 35 (H) 11/19/2021  ? TSH 2.29 11/19/2021  ? HGBA1C 5.4 11/24/2020  ? ?Assessment/Plan:  ?SHARONN KILMER is a 87 y.o. White or Caucasian [1] female with  has a past medical history of Allergic rhinitis, Asthma, Barrett's esophagus, COPD (chronic obstructive pulmonary disease) ([place]), DJD (degenerative joint disease), Endometriosis, GERD (gastroesophageal reflux disease), History of shingles, Hyperlipidemia, Hypertension, Osteoporosis, and Vitamin D deficiency. ? ?Vitamin D deficiency ?Last vitamin D ?Lab Results  ?Component Value Date  ? VD25OH 34.10 11/19/2021  ? ?stil mild low, to increased oral replacement 2000 qd ? ? ?Encounter for well adult exam with abnormal findings ?Age and sex appropriate education and counseling updated with regular exercise and diet ?Referrals for preventative services - none needed ?Immunizations addressed - declines shingrix ?Smoking counseling  - none needed ?Evidence for depression or other mood disorder - none significant ?Most recent labs reviewed. ?I have personally reviewed and have noted: ?1) the patient's medical and social history ?2) The patient's current medications and supplements ?3) The patient's height, weight, and BMI have been recorded in the chart ? ? ?Hyperglycemia ?Lab Results  ?Component Value Date  ? HGBA1C 5.4 11/24/2020  ? ?Stable, pt to cont current diet,  to f/u any worsening symptoms or concerns ? ? ?B12 deficiency ?Lab Results  ?Component Value Date  ? [id number] 239  11/19/2021  ? ?Low, to change to a new brand of oral replacement - b12 1000 mcg qd to see if can tolerate better ? ? ?Right-sided sensorineural hearing loss ?With mild recent worsening - pt to look in to OTC hearing aid ? ?Hyp

## 2021-11-24 NOTE — Patient Instructions (Signed)
Ok to increase the Vit D3 to 2000 units per day ? ?Please take all new medication as prescribed - the losartan 50 mg per day ? ?Please consider an OTC hearing aide for the right ear ? ?Please continue all other medications as before, and refills have been done if requested. ? ?Please have the pharmacy call with any other refills you may need. ? ?Please continue your efforts at being more active, low cholesterol diet, and weight control. ? ?You are otherwise up to date with prevention measures today. ? ?Please keep your appointments with your specialists as you may have planned ? ?Please make an Appointment to return in 6 months, or sooner if needed ?

## 2021-11-26 ENCOUNTER — Encounter: Payer: Self-pay | Admitting: Internal Medicine

## 2021-11-26 DIAGNOSIS — H905 Unspecified sensorineural hearing loss: Secondary | ICD-10-CM | POA: Insufficient documentation

## 2021-11-28 ENCOUNTER — Encounter: Payer: Self-pay | Admitting: Internal Medicine

## 2021-11-28 NOTE — Assessment & Plan Note (Signed)
Lab Results  ?Component Value Date  ? HGBA1C 5.4 11/24/2020  ? ?Stable, pt to cont current diet,  to f/u any worsening symptoms or concerns ? ?

## 2021-11-28 NOTE — Assessment & Plan Note (Signed)
Lab Results  ?Component Value Date  ? LDLCALC 104 (H) 11/24/2020  ? ?uncontrolled, goal ldl < 100, pt for lower chol diet, declines statin ? ?

## 2021-11-28 NOTE — Assessment & Plan Note (Signed)
With mild recent worsening - pt to look in to OTC hearing aid ?

## 2021-11-28 NOTE — Assessment & Plan Note (Signed)
Lab Results  ?Component Value Date  ? [id number] 239 11/19/2021  ? ?Low, to change to a new brand of oral replacement - b12 1000 mcg qd to see if can tolerate better ? ?

## 2021-11-28 NOTE — Assessment & Plan Note (Signed)
BP Readings from Last 3 Encounters:  ?11/24/21 (!) 178/70  ?09/30/21 (!) 150/82  ?07/13/21 (!) 162/100  ? ?uncontrolled, pt to start losartan 50 qd ? ?

## 2021-11-28 NOTE — Assessment & Plan Note (Signed)

## 2022-05-14 ENCOUNTER — Other Ambulatory Visit: Payer: Self-pay | Admitting: Internal Medicine

## 2022-05-26 ENCOUNTER — Telehealth: Payer: Self-pay | Admitting: Internal Medicine

## 2022-05-27 ENCOUNTER — Ambulatory Visit: Payer: Medicare Other | Admitting: Internal Medicine

## 2022-05-27 VITALS — BP 144/78 | HR 73 | Temp 98.3°F | Ht 60.0 in | Wt 139.0 lb

## 2022-05-27 DIAGNOSIS — E559 Vitamin D deficiency, unspecified: Secondary | ICD-10-CM | POA: Diagnosis not present

## 2022-05-27 DIAGNOSIS — E7800 Pure hypercholesterolemia, unspecified: Secondary | ICD-10-CM | POA: Diagnosis not present

## 2022-05-27 DIAGNOSIS — I10 Essential (primary) hypertension: Secondary | ICD-10-CM

## 2022-05-27 DIAGNOSIS — R739 Hyperglycemia, unspecified: Secondary | ICD-10-CM

## 2022-05-27 DIAGNOSIS — Z23 Encounter for immunization: Secondary | ICD-10-CM | POA: Diagnosis not present

## 2022-05-27 DIAGNOSIS — E538 Deficiency of other specified B group vitamins: Secondary | ICD-10-CM | POA: Diagnosis not present

## 2022-05-27 NOTE — Assessment & Plan Note (Signed)
Last vitamin D Lab Results  Component Value Date   VD25OH 34.10 11/19/2021   Low, reminded to start oral replacement

## 2022-05-27 NOTE — Progress Notes (Unsigned)
Patient ID: Sierra Robbins, female   DOB: [birth year], 87 y.o.   MRN: [id number]        Chief Complaint: follow up HTN, HLD and hyperglycemia, low vit d and b12       HPI:  Sierra Robbins is a 87 y.o. female here overall doing ok, almost 87yo, lives alone, no cane or walker, not really slowing it seems.  BP has been < 140/90 at home, trying to follow lower chol diet, Pt denies chest pain, increased sob or doe, wheezing, orthopnea, PND, increased LE swelling, palpitations, dizziness or syncope.   Pt denies polydipsia, polyuria, or new focal neuro s/s.   Taking B12 occasionally  Not taking Vit D       Wt Readings from Last 3 Encounters:  05/27/22 139 lb (63 kg)  11/24/21 146 lb (66.2 kg)  09/30/21 147 lb 9.6 oz (67 kg)   BP Readings from Last 3 Encounters:  05/27/22 (!) 144/78  11/24/21 (!) 178/70  09/30/21 (!) 150/82         Past Medical History:  Diagnosis Date   Allergic rhinitis    Asthma    Barrett's esophagus    COPD (chronic obstructive pulmonary disease) ([facility])    DJD (degenerative joint disease)    hand   Endometriosis    GERD (gastroesophageal reflux disease)    History of shingles    Left upper arm/axilla   Hyperlipidemia    Hypertension    Osteoporosis    Vitamin D deficiency    Past Surgical History:  Procedure Laterality Date   ABDOMINAL HYSTERECTOMY  1979   APPENDECTOMY     CHOLECYSTECTOMY  1995   NASAL SEPTUM SURGERY     TRACHEOSTOMY     s/p temp; at 87 yo for croup   VESICOVAGINAL FISTULA CLOSURE W/ TAH      reports that she has never smoked. She has never used smokeless tobacco. She reports current alcohol use. She reports that she does not use drugs. family history includes Asthma in her sister; Heart attack in her mother; Rheum arthritis in her sister and sister; Stomach cancer in her maternal grandfather; Stroke in her father. Allergies  Allergen Reactions   Alendronate Sodium Other (See Comments)    REACTION: leg cramps   Atorvastatin Other (See  Comments)    Myalgia, weakness in legs and arms   Ibandronate Sodium Other (See Comments)    REACTION: gi upset, and sluggish   Prednisone Swelling    Mouth and tongue swelling   Ramipril Swelling    Throat, mouth, and lip swelling   Statins Other (See Comments)    REACTION: myalgias; weakness in legs and arms   Current Outpatient Medications on File Prior to Visit  Medication Sig Dispense Refill   acetaminophen (TYLENOL) 325 MG tablet Take 162.5 mg by mouth every 6 (six) hours as needed for headache (pain).     albuterol (PROAIR HFA) 108 (90 Base) MCG/ACT inhaler inhale 2 puffs by mouth every 4 hours if needed for wheezing and shortness of breath 18 g PRN   amitriptyline (ELAVIL) 25 MG tablet Take 0.5 tablets (12.5 mg total) by mouth at bedtime as needed for sleep. 45 tablet 1   Ascorbic Acid (VITAMIN C PO) Take 0.5 mg by mouth See admin instructions. Take 1/2 tablet by mouth three to four times monthly.     aspirin EC 81 MG tablet Take 81 mg by mouth daily with breakfast.     Cholecalciferol 50 MCG (  2000 UT) TABS 1 tab by mouth once daily 30 tablet 99   Cod Liver Oil 1000 MG CAPS Take 1,000 mg by mouth daily with breakfast.     diphenhydrAMINE (BENADRYL) 25 MG tablet Take 12.5 mg by mouth every 6 (six) hours as needed for itching.     fluticasone (FLONASE) 50 MCG/ACT nasal spray Place 1 spray into both nostrils daily as needed for allergies or rhinitis.     hydrochlorothiazide (HYDRODIURIL) 25 MG tablet 1 tab by mouth once daily 90 tablet 3   hydrocortisone cream 1 % Apply 1 application topically 2 (two) times daily as needed for itching.     ibuprofen (ADVIL) 200 MG tablet Take 200 mg by mouth every 6 (six) hours as needed for headache (pain).     losartan (COZAAR) 50 MG tablet Take 1 tablet (50 mg total) by mouth daily. 90 tablet 3   Multiple Vitamin (MULTIVITAMIN WITH MINERALS) TABS tablet Take 1 tablet by mouth daily with breakfast. Centrum A-Z     vitamin E 400 UNIT capsule Take 400  Units by mouth daily with breakfast.      No current facility-administered medications on file prior to visit.        ROS:  All others reviewed and negative.  Objective        PE:  BP (!) 144/78 (BP Location: Left Arm, Patient Position: Sitting, Cuff Size: Large)   Pulse 73   Temp 98.3 F (36.8 C) (Oral)   Ht 5' (1.524 m)   Wt 139 lb (63 kg)   SpO2 93%   BMI 27.15 kg/m                 Constitutional: Pt appears in NAD               HENT: Head: NCAT.                Right Ear: External ear normal.                 Left Ear: External ear normal.                Eyes: . Pupils are equal, round, and reactive to light. Conjunctivae and EOM are normal               Nose: without d/c or deformity               Neck: Neck supple. Gross normal ROM               Cardiovascular: Normal rate and regular rhythm.                 Pulmonary/Chest: Effort normal and breath sounds without rales or wheezing.                Abd:  Soft, NT, ND, + BS, no organomegaly               Neurological: Pt is alert. At baseline orientation, motor grossly intact               Skin: Skin is warm. No rashes, no other new lesions, LE edema - none               Psychiatric: Pt behavior is normal without agitation   Micro: none  Cardiac tracings I have personally interpreted today:  none  Pertinent Radiological findings (summarize): none   Lab Results  Component Value Date   WBC 5.2 11/19/2021  HGB 13.9 11/19/2021   HCT 40.3 11/19/2021   PLT 237.0 11/19/2021   GLUCOSE 103 (H) 11/19/2021   CHOL 189 11/19/2021   TRIG 219.0 (H) 11/19/2021   HDL 47.60 11/19/2021   LDLDIRECT 113.0 11/19/2021   LDLCALC 104 (H) 11/24/2020   ALT 15 11/19/2021   AST 23 11/19/2021   NA 141 11/19/2021   K 3.7 11/19/2021   CL 101 11/19/2021   CREATININE 0.54 11/19/2021   BUN 12 11/19/2021   CO2 35 (H) 11/19/2021   TSH 2.29 11/19/2021   HGBA1C 5.4 11/24/2020   Assessment/Plan:  Sierra Robbins is a 87 y.o. White or  Caucasian [1] female with  has a past medical history of Allergic rhinitis, Asthma, Barrett's esophagus, COPD (chronic obstructive pulmonary disease) ([facility]), DJD (degenerative joint disease), Endometriosis, GERD (gastroesophageal reflux disease), History of shingles, Hyperlipidemia, Hypertension, Osteoporosis, and Vitamin D deficiency.  Vitamin D deficiency Last vitamin D Lab Results  Component Value Date   VD25OH 34.10 11/19/2021   Low, reminded to start oral replacement   Hyperglycemia Lab Results  Component Value Date   HGBA1C 5.4 11/24/2020   Stable, pt to continue current medical treatment  - diet, wt control, excercise   B12 deficiency Lab Results  Component Value Date   VITAMINB12 239 11/19/2021   Low normal, to start oral replacement - b12 1000 mcg qd   Hypertension, uncontrolled BP Readings from Last 3 Encounters:  05/27/22 (!) 144/78  11/24/21 (!) 178/70  09/30/21 (!) 150/82   Uncontrolled here, but pt states controlled at home, pt to continue medical treatment hct 25 qd, and losartan 50 qd   Hyperlipidemia Lab Results  Component Value Date   LDLCALC 104 (H) 11/24/2020   Uncontrolled, pt to continue low chol diet, declines statin, but for start zetia 10 mg qd  Followup: Return in about 6 months (around 11/25/2022).  Oliver Barre, MD 05/28/2022 1:46 PM [facility]

## 2022-05-27 NOTE — Telephone Encounter (Signed)
ATC LVMTCB X1  

## 2022-05-27 NOTE — Patient Instructions (Signed)
You had the flu shot today  Please continue to take the B12 and Vit D as you do  Please take all new medication as prescribed - the zetia 10 mg for cholesterol  Please continue all other medications as before, and refills have been done if requested.  Please have the pharmacy call with any other refills you may need.  Please continue your efforts at being more active, low cholesterol diet, and weight control.  Please keep your appointments with your specialists as you may have planned  We can hold on lab testing today  Please make an Appointment to return in 6 months, or sooner if needed, also with Lab Appointment for testing done 3-5 days before at the [facility] (so this is for TWO appointments - please see the scheduling desk as you leave)

## 2022-05-28 ENCOUNTER — Encounter: Payer: Self-pay | Admitting: Internal Medicine

## 2022-05-28 NOTE — Assessment & Plan Note (Signed)
Lab Results  Component Value Date   HGBA1C 5.4 11/24/2020   Stable, pt to continue current medical treatment  - diet, wt control, excercise

## 2022-05-28 NOTE — Assessment & Plan Note (Signed)
Lab Results  Component Value Date   VITAMINB12 239 11/19/2021   Low normal, to start oral replacement - b12 1000 mcg qd

## 2022-05-28 NOTE — Assessment & Plan Note (Addendum)
Lab Results  Component Value Date   LDLCALC 104 (H) 11/24/2020   Uncontrolled, pt to continue low chol diet, declines statin, but for start zetia 10 mg qd

## 2022-05-28 NOTE — Assessment & Plan Note (Signed)
BP Readings from Last 3 Encounters:  05/27/22 (!) 144/78  11/24/21 (!) 178/70  09/30/21 (!) 150/82   Uncontrolled here, but pt states controlled at home, pt to continue medical treatment hct 25 qd, and losartan 50 qd

## 2022-05-30 MED ORDER — AMOXICILLIN 500 MG PO TABS: 500.0000 mg | ORAL_TABLET | Freq: Two times a day (BID) | ORAL | 0 refills | Status: DC

## 2022-05-30 NOTE — Telephone Encounter (Signed)
ATC left detailed message that ordered is placed and to call office if she had any questions. Medication order placed. Nothing further needed.

## 2022-05-30 NOTE — Telephone Encounter (Signed)
Amoxacillin 500 mg, # 14, 1 twice daily °

## 2022-05-30 NOTE — Telephone Encounter (Signed)
Spoke with pt who states that weather change has caused increased productive cough. Pt states that she last used PRN Amoxacillin in Spring of this year. Pt is requesting a round now. Dr. Annamaria Boots can we place this order?

## 2022-06-06 ENCOUNTER — Ambulatory Visit (INDEPENDENT_AMBULATORY_CARE_PROVIDER_SITE_OTHER): Payer: Medicare Other

## 2022-06-06 VITALS — Ht 60.0 in

## 2022-06-06 DIAGNOSIS — Z0000 Encounter for general adult medical examination without abnormal findings: Secondary | ICD-10-CM | POA: Diagnosis not present

## 2022-06-06 NOTE — Patient Instructions (Signed)
Sierra Robbins , Thank you for taking time to come for your Medicare Wellness Visit. I appreciate your ongoing commitment to your health goals. Please review the following plan we discussed and let me know if I can assist you in the future.   These are the goals we discussed:  Goals      My goal is to eat healthy, keep walking, maintain my health and stay active in church.        This is a list of the screening recommended for you and due dates:  Health Maintenance  Topic Date Due   Zoster (Shingles) Vaccine (1 of 2) 08/26/2022*   Tetanus Vaccine  02/21/2029   Pneumonia Vaccine  Completed   Flu Shot  Completed   DEXA scan (bone density measurement)  Completed   HPV Vaccine  Aged Out   COVID-19 Vaccine  Discontinued  *Topic was postponed. The date shown is not the original due date.    Advanced directives: Yes  Conditions/risks identified: Yes  Next appointment: Follow up in one year for your annual wellness visit 06/08/2023 at 1:00 p.m. telephone visit with Mignon Pine, Nurse Health Advisor.  If you need to reschedule or cancel, please call [phone].   Preventive Care 85 Years and Older, Female Preventive care refers to lifestyle choices and visits with your health care provider that can promote health and wellness. What does preventive care include? A yearly physical exam. This is also called an annual well check. Dental exams once or twice a year. Routine eye exams. Ask your health care provider how often you should have your eyes checked. Personal lifestyle choices, including: Daily care of your teeth and gums. Regular physical activity. Eating a healthy diet. Avoiding tobacco and drug use. Limiting alcohol use. Practicing safe sex. Taking low-dose aspirin every day. Taking vitamin and mineral supplements as recommended by your health care provider. What happens during an annual well check? The services and screenings done by your health care provider during your annual  well check will depend on your age, overall health, lifestyle risk factors, and family history of disease. Counseling  Your health care provider may ask you questions about your: Alcohol use. Tobacco use. Drug use. Emotional well-being. Home and relationship well-being. Sexual activity. Eating habits. History of falls. Memory and ability to understand (cognition). Work and work Statistician. Reproductive health. Screening  You may have the following tests or measurements: Height, weight, and BMI. Blood pressure. Lipid and cholesterol levels. These may be checked every 5 years, or more frequently if you are over 85 years old. Skin check. Lung cancer screening. You may have this screening every year starting at age 85 if you have a 30-pack-year history of smoking and currently smoke or have quit within the past 15 years. Fecal occult blood test (FOBT) of the stool. You may have this test every year starting at age 85. Flexible sigmoidoscopy or colonoscopy. You may have a sigmoidoscopy every 5 years or a colonoscopy every 10 years starting at age 85. Hepatitis C blood test. Hepatitis B blood test. Sexually transmitted disease (STD) testing. Diabetes screening. This is done by checking your blood sugar (glucose) after you have not eaten for a while (fasting). You may have this done every 1-3 years. Bone density scan. This is done to screen for osteoporosis. You may have this done starting at age 85. Mammogram. This may be done every 1-2 years. Talk to your health care provider about how often you should have regular mammograms. Talk with  your health care provider about your test results, treatment options, and if necessary, the need for more tests. Vaccines  Your health care provider may recommend certain vaccines, such as: Influenza vaccine. This is recommended every year. Tetanus, diphtheria, and acellular pertussis (Tdap, Td) vaccine. You may need a Td booster every 10 years. Zoster  vaccine. You may need this after age 85. Pneumococcal 13-valent conjugate (PCV13) vaccine. One dose is recommended after age 85. Pneumococcal polysaccharide (PPSV23) vaccine. One dose is recommended after age 85. Talk to your health care provider about which screenings and vaccines you need and how often you need them. This information is not intended to replace advice given to you by your health care provider. Make sure you discuss any questions you have with your health care provider. Document Released: 09/11/2015 Document Revised: 05/04/2016 Document Reviewed: 06/16/2015 Elsevier Interactive Patient Education  2017 [place] Prevention in the Home Falls can cause injuries. They can happen to people of all ages. There are many things you can do to make your home safe and to help prevent falls. What can I do on the outside of my home? Regularly fix the edges of walkways and driveways and fix any cracks. Remove anything that might make you trip as you walk through a door, such as a raised step or threshold. Trim any bushes or trees on the path to your home. Use bright outdoor lighting. Clear any walking paths of anything that might make someone trip, such as rocks or tools. Regularly check to see if handrails are loose or broken. Make sure that both sides of any steps have handrails. Any raised decks and porches should have guardrails on the edges. Have any leaves, snow, or ice cleared regularly. Use sand or salt on walking paths during winter. Clean up any spills in your garage right away. This includes oil or grease spills. What can I do in the bathroom? Use night lights. Install grab bars by the toilet and in the tub and shower. Do not use towel bars as grab bars. Use non-skid mats or decals in the tub or shower. If you need to sit down in the shower, use a plastic, non-slip stool. Keep the floor dry. Clean up any water that spills on the floor as soon as it happens. Remove  soap buildup in the tub or shower regularly. Attach bath mats securely with double-sided non-slip rug tape. Do not have throw rugs and other things on the floor that can make you trip. What can I do in the bedroom? Use night lights. Make sure that you have a light by your bed that is easy to reach. Do not use any sheets or blankets that are too big for your bed. They should not hang down onto the floor. Have a firm chair that has side arms. You can use this for support while you get dressed. Do not have throw rugs and other things on the floor that can make you trip. What can I do in the kitchen? Clean up any spills right away. Avoid walking on wet floors. Keep items that you use a lot in easy-to-reach places. If you need to reach something above you, use a strong step stool that has a grab bar. Keep electrical cords out of the way. Do not use floor polish or wax that makes floors slippery. If you must use wax, use non-skid floor wax. Do not have throw rugs and other things on the floor that can make you trip.  What can I do with my stairs? Do not leave any items on the stairs. Make sure that there are handrails on both sides of the stairs and use them. Fix handrails that are broken or loose. Make sure that handrails are as long as the stairways. Check any carpeting to make sure that it is firmly attached to the stairs. Fix any carpet that is loose or worn. Avoid having throw rugs at the top or bottom of the stairs. If you do have throw rugs, attach them to the floor with carpet tape. Make sure that you have a light switch at the top of the stairs and the bottom of the stairs. If you do not have them, ask someone to add them for you. What else can I do to help prevent falls? Wear shoes that: Do not have high heels. Have rubber bottoms. Are comfortable and fit you well. Are closed at the toe. Do not wear sandals. If you use a stepladder: Make sure that it is fully opened. Do not climb a  closed stepladder. Make sure that both sides of the stepladder are locked into place. Ask someone to hold it for you, if possible. Clearly mark and make sure that you can see: Any grab bars or handrails. First and last steps. Where the edge of each step is. Use tools that help you move around (mobility aids) if they are needed. These include: Canes. Walkers. Scooters. Crutches. Turn on the lights when you go into a dark area. Replace any light bulbs as soon as they burn out. Set up your furniture so you have a clear path. Avoid moving your furniture around. If any of your floors are uneven, fix them. If there are any pets around you, be aware of where they are. Review your medicines with your doctor. Some medicines can make you feel dizzy. This can increase your chance of falling. Ask your doctor what other things that you can do to help prevent falls. This information is not intended to replace advice given to you by your health care provider. Make sure you discuss any questions you have with your health care provider. Document Released: 06/11/2009 Document Revised: 01/21/2016 Document Reviewed: 09/19/2014 Elsevier Interactive Patient Education  2017 [employer].

## 2022-06-06 NOTE — Progress Notes (Signed)
.Virtual Visit via Telephone Note  I connected with  Sierra RenMary C Dalpe on 06/06/22 at  1:00 PM EDT by telephone and verified that I am speaking with the correct person using two identifiers.  Location: Patient: Home Provider: [facility] Persons participating in the virtual visit: patient/Nurse Health Advisor   I discussed the limitations, risks, security and privacy concerns of performing an evaluation and management service by telephone and the availability of in person appointments. The patient expressed understanding and agreed to proceed.  Interactive audio and video telecommunications were attempted between this nurse and patient, however failed, due to patient having technical difficulties OR patient did not have access to video capability.  We continued and completed visit with audio only.  Some vital signs may be absent or patient reported.   Mickeal NeedyShenika N Kaylee Wombles, LPN  Subjective:   Sierra Robbins is a 87 y.o. female who presents for Medicare Annual (Subsequent) preventive examination.  Review of Systems     Cardiac Risk Factors include: advanced age (>2555men, 29>65 women);dyslipidemia;family history of premature cardiovascular disease;hypertension     Objective:    Today's Vitals   06/06/22 1304  Height: 5' (1.524 m)  PainSc: 0-No pain   Body mass index is 27.15 kg/m.     06/06/2022    1:05 PM 06/04/2021    9:31 AM 02/25/2020   10:15 AM 11/08/2019    8:22 PM  Advanced Directives  Does Patient Have a Medical Advance Directive? Yes Yes Yes No  Type of Estate agentAdvance Directive Healthcare Power of [place]orney;Living will Living will;Healthcare Power of State Street Corporationttorney Healthcare Power of [place]orney;Living will   Does patient want to make changes to medical advance directive?  No - Patient declined No - Patient declined   Copy of Healthcare Power of Attorney in Chart? No - copy requested  No - copy requested   Would patient like information on creating a medical advance directive?    No -  Patient declined    Current Medications (verified) Outpatient Encounter Medications as of 06/06/2022  Medication Sig   acetaminophen (TYLENOL) 325 MG tablet Take 162.5 mg by mouth every 6 (six) hours as needed for headache (pain).   albuterol (PROAIR HFA) 108 (90 Base) MCG/ACT inhaler inhale 2 puffs by mouth every 4 hours if needed for wheezing and shortness of breath   amitriptyline (ELAVIL) 25 MG tablet Take 0.5 tablets (12.5 mg total) by mouth at bedtime as needed for sleep.   amoxicillin (AMOXIL) 500 MG tablet Take 1 tablet (500 mg total) by mouth 2 (two) times daily.   Ascorbic Acid (VITAMIN C PO) Take 0.5 mg by mouth See admin instructions. Take 1/2 tablet by mouth three to four times monthly.   aspirin EC 81 MG tablet Take 81 mg by mouth daily with breakfast.   Cholecalciferol 50 MCG (2000 UT) TABS 1 tab by mouth once daily   Cod Liver Oil 1000 MG CAPS Take 1,000 mg by mouth daily with breakfast.   diphenhydrAMINE (BENADRYL) 25 MG tablet Take 12.5 mg by mouth every 6 (six) hours as needed for itching.   fluticasone (FLONASE) 50 MCG/ACT nasal spray Place 1 spray into both nostrils daily as needed for allergies or rhinitis.   hydrochlorothiazide (HYDRODIURIL) 25 MG tablet 1 tab by mouth once daily   hydrocortisone cream 1 % Apply 1 application topically 2 (two) times daily as needed for itching.   ibuprofen (ADVIL) 200 MG tablet Take 200 mg by mouth every 6 (six) hours as needed for headache (  pain).   losartan (COZAAR) 50 MG tablet Take 1 tablet (50 mg total) by mouth daily.   Multiple Vitamin (MULTIVITAMIN WITH MINERALS) TABS tablet Take 1 tablet by mouth daily with breakfast. Centrum A-Z   vitamin E 400 UNIT capsule Take 400 Units by mouth daily with breakfast.    No facility-administered encounter medications on file as of 06/06/2022.    Allergies (verified) Alendronate sodium, Atorvastatin, Ibandronate sodium, Prednisone, Ramipril, and Statins   History: Past Medical History:   Diagnosis Date   Allergic rhinitis    Asthma    Barrett's esophagus    COPD (chronic obstructive pulmonary disease) (HCC)    DJD (degenerative joint disease)    hand   Endometriosis    GERD (gastroesophageal reflux disease)    History of shingles    Left upper arm/axilla   Hyperlipidemia    Hypertension    Osteoporosis    Vitamin D deficiency    Past Surgical History:  Procedure Laterality Date   ABDOMINAL HYSTERECTOMY  1979   APPENDECTOMY     CHOLECYSTECTOMY  1995   NASAL SEPTUM SURGERY     TRACHEOSTOMY     s/p temp; at 87 yo for croup   VESICOVAGINAL FISTULA CLOSURE W/ TAH     Family History  Problem Relation Age of Onset   Stroke Father    Heart attack Mother    Asthma Sister    Stomach cancer Maternal Grandfather    Rheum arthritis Sister    Rheum arthritis Sister    Social History   Socioeconomic History   Marital status: Divorced    Spouse name: Not on file   Number of children: 1   Years of education: Not on file   Highest education level: Not on file  Occupational History   Occupation: [employer]IONS-[employer]    Employer: [employer]  Tobacco Use   Smoking status: Never   Smokeless tobacco: Never  Vaping Use   Vaping Use: Never used  Substance and Sexual Activity   Alcohol use: Yes    Comment: during the holidays   Drug use: No   Sexual activity: Not Currently  Other Topics Concern   Not on file  Social History Narrative   Not on file   Social Determinants of Health   Financial Resource Strain: Low Risk  (06/06/2022)   Overall Financial Resource Strain (CARDIA)    Difficulty of Paying Living Expenses: Not hard at all  Food Insecurity: No Food Insecurity (06/06/2022)   Hunger Vital Sign    Worried About Running Out of Food in the Last Year: Never true    Ran Out of Food in the Last Year: Never true  Transportation Needs: No Transportation Needs (06/06/2022)   PRAPARE - Administrator, Civil Service (Medical): No    Lack of  Transportation (Non-Medical): No  Physical Activity: Sufficiently Active (06/06/2022)   Exercise Vital Sign    Days of Exercise per Week: 5 days    Minutes of Exercise per Session: 30 min  Stress: No Stress Concern Present (06/04/2021)   [employer] of Occupational Health - Occupational Stress Questionnaire    Feeling of Stress : Not at all  Social Connections: Moderately Integrated (06/06/2022)   Social Connection and Isolation Panel [NHANES]    Frequency of Communication with Friends and Family: More than three times a week    Frequency of Social Gatherings with Friends and Family: More than three times a week    Attends Religious  Services: More than 4 times per year    Active Member of Clubs or Organizations: Yes    Attends Banker Meetings: 1 to 4 times per year    Marital Status: Widowed    Tobacco Counseling Counseling given: Not Answered   Clinical Intake:  Pre-visit preparation completed: Yes  Pain : No/denies pain Pain Score: 0-No pain     Nutritional Risks: None Diabetes: No  How often do you need to have someone help you when you read instructions, pamphlets, or other written materials from your doctor or pharmacy?: 1 - Never What is the last grade level you completed in school?: HSG  Diabetic? no  Interpreter Needed?: No  Information entered by :: Susie Cassette, LPN.   Activities of Daily Living    06/06/2022    1:10 PM  In your present state of health, do you have any difficulty performing the following activities:  Hearing? 0  Vision? 0  Difficulty concentrating or making decisions? 0  Walking or climbing stairs? 0  Dressing or bathing? 0  Doing errands, shopping? 0  Preparing Food and eating ? N  Using the Toilet? N  In the past six months, have you accidently leaked urine? N  Do you have problems with loss of bowel control? N  Managing your Medications? N  Managing your Finances? N  Housekeeping or managing your  Housekeeping? N    Patient Care Team: Corwin Levins, MD as PCP - General  Indicate any recent Medical Services you may have received from other than [facility] providers in the past year (date may be approximate).     Assessment:   This is a routine wellness examination for [facility].  Hearing/Vision screen Hearing Screening - Comments:: Denies hearing difficulties   Vision Screening - Comments:: Wears rx glasses - up to date with routine eye exams with [facility]   Dietary issues and exercise activities discussed: Current Exercise Habits: Home exercise routine, Type of exercise: walking, Time (Minutes): 30, Frequency (Times/Week): 5, Weekly Exercise (Minutes/Week): 150, Intensity: Moderate, Exercise limited by: None identified   Goals Addressed             This Visit's Progress    My goal is to eat healthy, keep walking, maintain my health and stay active in church.        Depression Screen    06/06/2022    1:08 PM 05/27/2022    9:03 AM 11/24/2021    9:36 AM 11/24/2021    9:08 AM 06/04/2021    9:33 AM 05/27/2021   11:27 AM 11/24/2020   11:27 AM  PHQ 2/9 Scores  PHQ - 2 Score 0 0 0 0 0 0 0  PHQ- 9 Score 0 0         Fall Risk    06/06/2022    1:08 PM 05/27/2022    9:03 AM 11/24/2021    9:36 AM 11/24/2021    9:08 AM 06/04/2021    9:32 AM  Fall Risk   Falls in the past year? 0 0 0 0 0  Number falls in past yr: 0  0 0 0  Injury with Fall? 0  0 0 0  Risk for fall due to : No Fall Risks No Fall Risks   No Fall Risks  Follow up Falls prevention discussed Falls evaluation completed   Falls evaluation completed    FALL RISK PREVENTION PERTAINING TO THE HOME:  Any stairs in or around the home? Yes  If  so, are there any without handrails? No  Home free of loose throw rugs in walkways, pet beds, electrical cords, etc? No  Adequate lighting in your home to reduce risk of falls? No   ASSISTIVE DEVICES UTILIZED TO PREVENT FALLS:  Life alert? No  Use of a cane, walker or  w/c? No  Grab bars in the bathroom? Yes  Shower chair or bench in shower? No  Elevated toilet seat or a handicapped toilet? No   TIMED UP AND GO:  Was the test performed? No .    Cognitive Function:    02/25/2020   10:17 AM  MMSE - Mini Mental State Exam  Not completed: Refused        06/06/2022    1:08 PM  6CIT Screen  What Year? 0 points  What month? 0 points  What time? 0 points  Count back from 20 0 points  Months in reverse 0 points  Repeat phrase 0 points  Total Score 0 points    Immunizations Immunization History  Administered Date(s) Administered   Fluad Quad(high Dose 65+) 06/13/2019, 05/27/2020, 05/27/2021, 05/27/2022   H1N1 07/29/2008   Influenza Split 05/29/2012, 05/25/2013, 05/29/2014   Influenza Whole 06/08/2007, 04/29/2008, 06/29/2010, 05/30/2011   Influenza, High Dose Seasonal PF 05/12/2016, 05/25/2017, 05/28/2018   Influenza-Unspecified 05/24/2015   PFIZER(Purple Top)SARS-COV-2 Vaccination 01/02/2020, 01/23/2020, 09/09/2020   Pneumococcal Conjugate-13 11/27/2013   Pneumococcal Polysaccharide-23 07/01/2002, 09/16/2008   Td 09/16/2008   Tdap 02/22/2019    TDAP status: Up to date  Flu Vaccine status: Up to date  Pneumococcal vaccine status: Up to date  Covid-19 vaccine status: Completed vaccines  Qualifies for Shingles Vaccine? Yes   Zostavax completed No   Shingrix Completed?: No.    Education has been provided regarding the importance of this vaccine. Patient has been advised to call insurance company to determine out of pocket expense if they have not yet received this vaccine. Advised may also receive vaccine at local pharmacy or Health Dept. Verbalized acceptance and understanding.  Screening Tests Health Maintenance  Topic Date Due   Zoster Vaccines- Shingrix (1 of 2) 08/26/2022 (Originally [birth year]1987)   TETANUS/TDAP  02/21/2029   Pneumonia Vaccine 68+ Years old  Completed   INFLUENZA VACCINE  Completed   DEXA SCAN  Completed   HPV  VACCINES  Aged Out   COVID-19 Vaccine  Discontinued    Health Maintenance  There are no preventive care reminders to display for this patient.  Colorectal cancer screening: No longer required.   Mammogram status: No longer required due to age.  Bone Density status: never done  Lung Cancer Screening: (Low Dose CT Chest recommended if Age 48-80 years, 30 pack-year currently smoking OR have quit w/in 15years.) does not qualify.   Lung Cancer Screening Referral: no  Additional Screening:  Hepatitis C Screening: does not qualify; Completed no  Vision Screening: Recommended annual ophthalmology exams for early detection of glaucoma and other disorders of the eye. Is the patient up to date with their annual eye exam?  Yes  Who is the provider or what is the name of the office in which the patient attends annual eye exams? [facility] If pt is not established with a provider, would they like to be referred to a provider to establish care? No .   Dental Screening: Recommended annual dental exams for proper oral hygiene  Community Resource Referral / Chronic Care Management: CRR required this visit?  No   CCM required this visit?  No  Plan:     I have personally reviewed and noted the following in the patient's chart:   Medical and social history Use of alcohol, tobacco or illicit drugs  Current medications and supplements including opioid prescriptions. Patient is not currently taking opioid prescriptions. Functional ability and status Nutritional status Physical activity Advanced directives List of other physicians Hospitalizations, surgeries, and ER visits in previous 12 months Vitals Screenings to include cognitive, depression, and falls Referrals and appointments  In addition, I have reviewed and discussed with patient certain preventive protocols, quality metrics, and best practice recommendations. A written personalized care plan for preventive  services as well as general preventive health recommendations were provided to patient.     Sheral Flow, LPN   [id number]   Nurse Notes: N/A

## 2022-07-04 IMAGING — DX DG KNEE AP/LAT W/ SUNRISE*L*
3 series · 3 of 3 positions shown · non-contrast
Comparison: None.

CLINICAL DATA: Left knee pain.

EXAM:
LEFT KNEE 3 VIEWS

[knee ap]
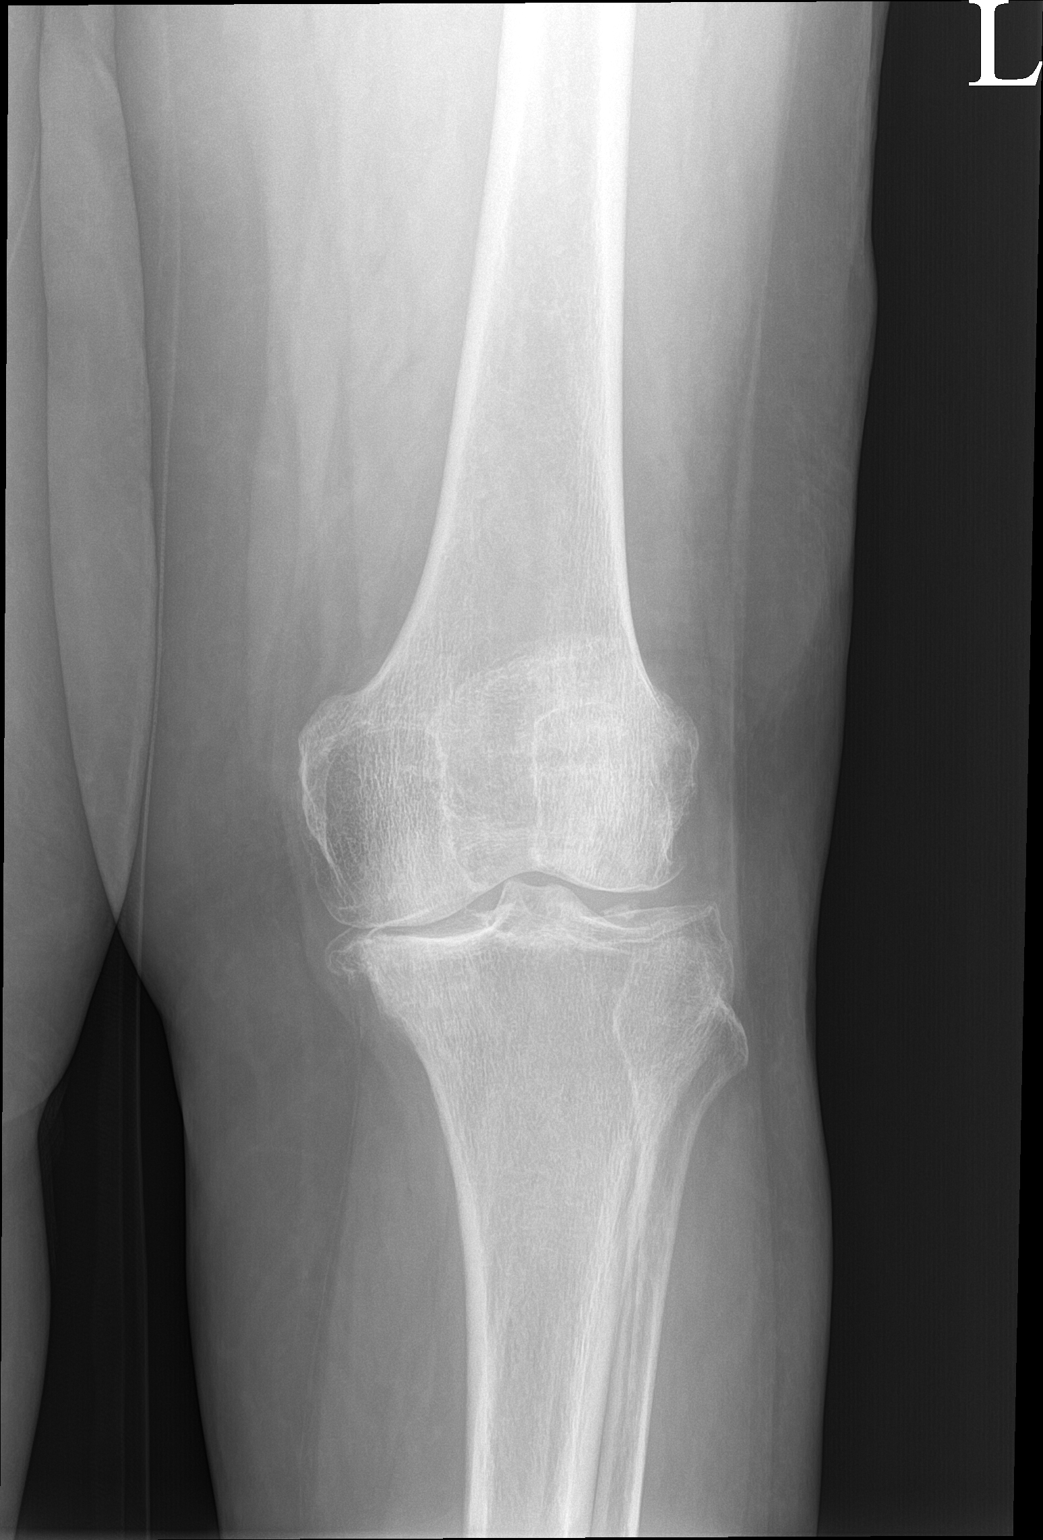

[knee lat]
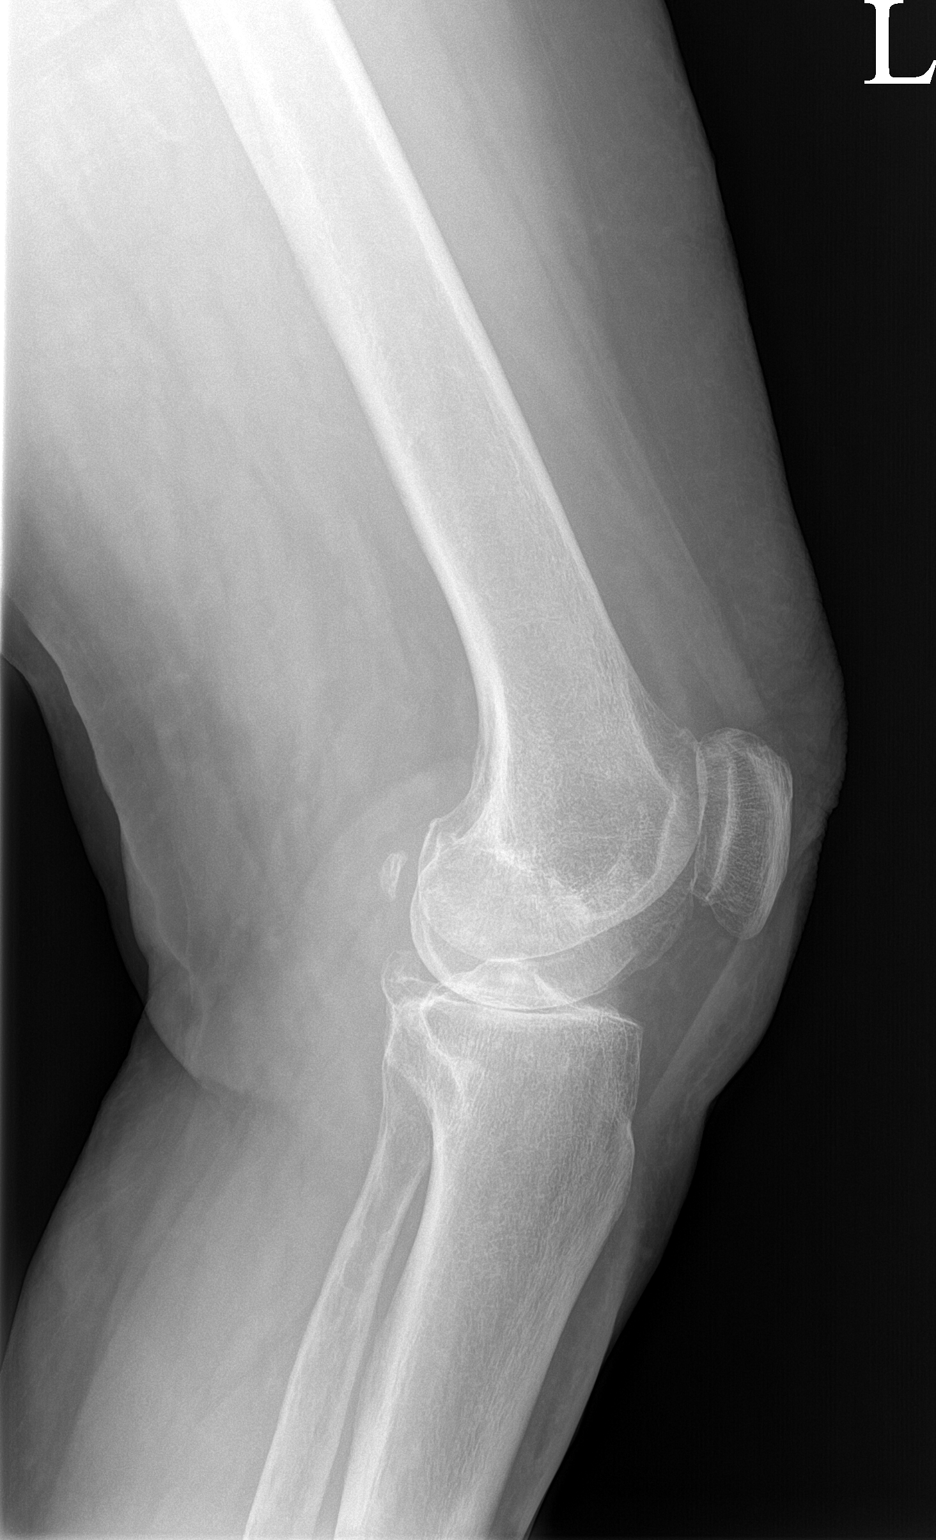

[patella]
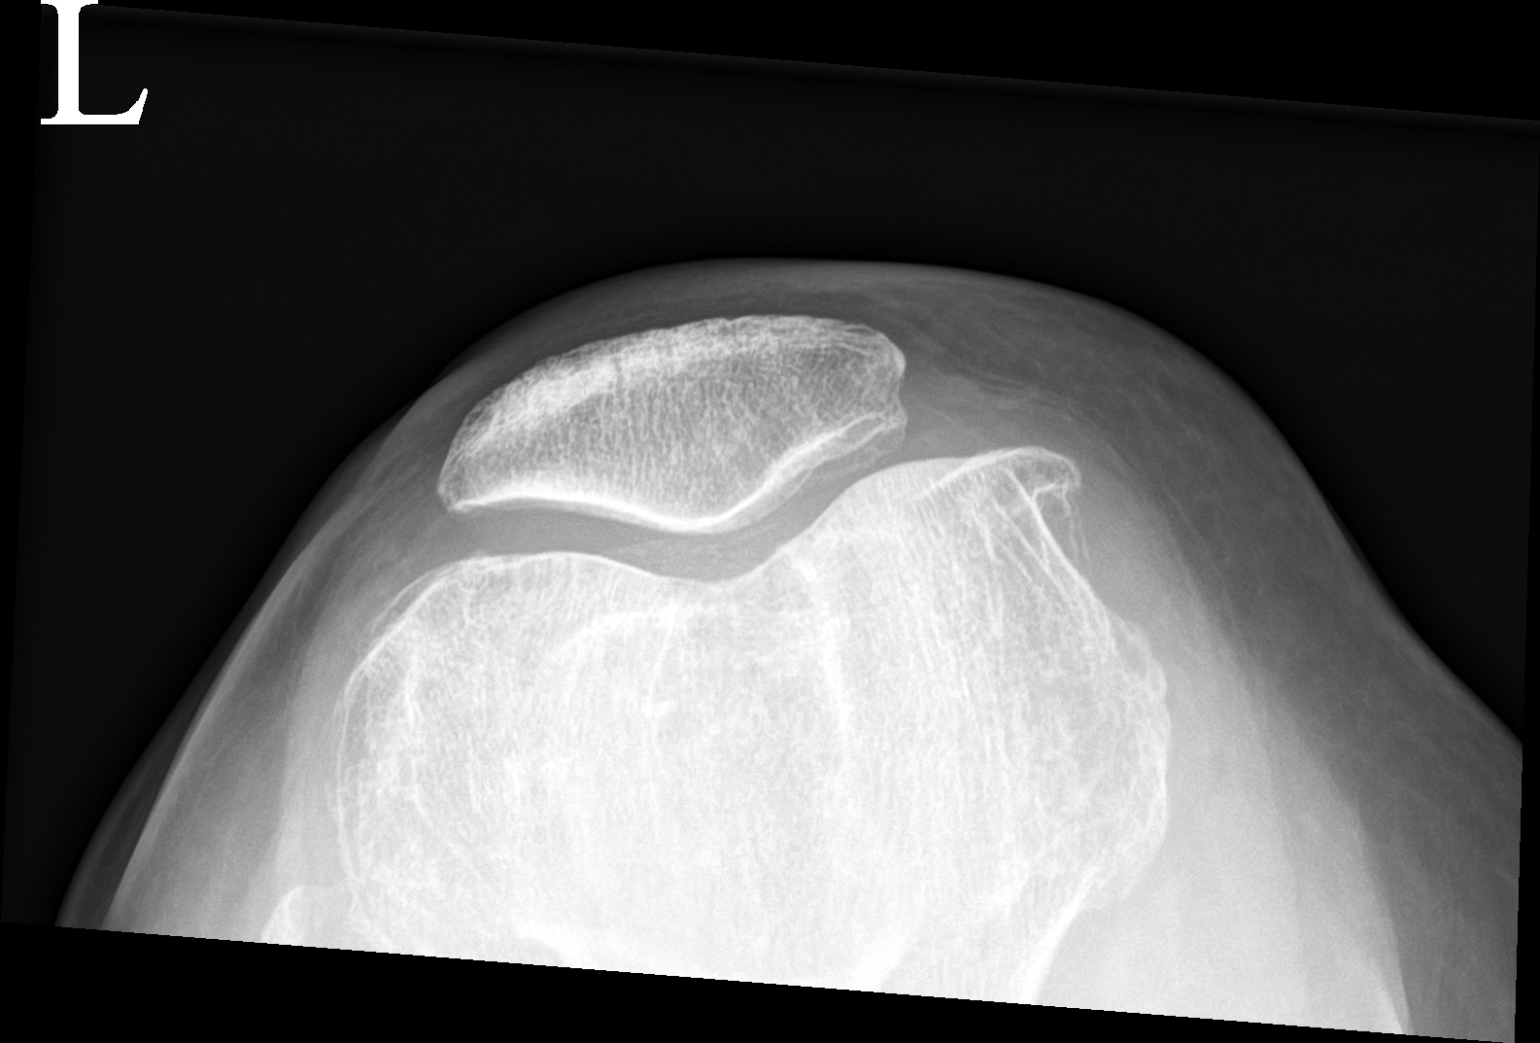

[3 of 3 positions shown; findings below may reference images not displayed]

FINDINGS: There is no acute fracture or dislocation. The bones are osteopenic.
There is arthritic changes with tricompartmental narrowing and
severe narrowing of the medial compartment. Trace suprapatellar
effusion. The soft tissues are unremarkable.
IMPRESSION: 1. No acute fracture or dislocation.
2. Osteoarthritis.

## 2022-09-29 NOTE — Progress Notes (Signed)
HPI female never smoker followed for allergic rhinitis, asthma, complicated by GERD/Barrett's, HBP Office Spirometry 09/13/2016-moderate airway obstruction. FVC 1.71/79%, FEV1 1.08/67%, ratio 0.63, FEF 25-75 percent 0.49/40  --------------------------------------------------------------------------------  09/30/21-  88 year old female never smoker followed for allergic Rhinitis, Asthma, complicated by GERD/Barrett's, HTN, Arthritis,  ------f/u mild persistent asthma  -Proair hfa, Nasacort Covid vax-3 Phizer Flu vax-had -----Patient feels like she is doing good overall, states that sometimes she has to clear throat more than usual.  ACT score- 23 Only needed rescue inhaler 4 times in past year. No longer has maintenance inhaler. She feels very well controlled.  Sometimes in the winter months may note a little more throat clearing but nothing specific.  She is not using a steroid inhaler.  She was quite chatty today about the good life she has led.  Lives with daughter.  09/30/22- 88 year old female never smoker followed for allergic Rhinitis, Asthma, complicated by GERD/Barrett's, HTN, Arthritis,  ------f/u mild persistent asthma  -Proair hfa, Nasacort Covid vax-3 Phizer Flu vax-had ACT score- 24 -----Pt is doing great And his recent cold air for some watery rhinorrhea but otherwise feels well and does not suspect infection.  Asthma is well-controlled.  She has not been needing to use her rescue inhaler or her nasal spray and we discussed these.  Does not think she needs refills now. Pruritic rash over the left scapula/shoulder in the past week.  She shows me a couple of slightly excoriated red spots but no obvious rash.  Very nonspecific and early shingles is not excluded.  I suspect it  is dry skin.  ROS-see HPI  + = positive Constitutional:   No-   weight loss, night sweats, fevers, chills, fatigue, lassitude. HEENT:   + headaches, no- difficulty swallowing, tooth/dental problems, sore  throat,       +sneezing, no-itching, ear ache, +nasal congestion, + post nasal drip,  CV:  No-   chest pain, orthopnea, PND, swelling in lower extremities, anasarca, dizziness, palpitations Resp: No-   shortness of breath with exertion or at rest.              No-   productive cough,  No non-productive cough,  No- coughing up of blood.              No-   change in color of mucus.  + wheezing.   Skin: No-   rash or lesions. GI:  No-   heartburn, indigestion, abdominal pain, nausea, vomiting,  GU: MS:  No-   joint pain or swelling.   Neuro-     nothing unusual Psych:  No- change in mood or affect. No depression or anxiety.  No memory loss.  OBJ General- Alert, Oriented, Affect-appropriate, Distress- none acute Skin- + Minor excoriation left shoulder Lymphadenopathy- none Head- atraumatic            Eyes- Gross vision intact, PERRLA, conjunctivae clear secretions            Ears- Hearing, canals-normal            Nose- , no-Septal dev, mucus, polyps, erosion, perforation ,             Throat- Mallampati III , mucosa clear , drainage- none, tonsils- atrophic.                     + Dentures Neck- flexible , old sternal scar ( surgical complication of ?thyroid surgery?) , no stridor , thyroid nl, carotid no bruit Chest - symmetrical  excursion , unlabored           Heart/CV- RRR , no murmur , no gallop  , no rub, nl s1 s2                           - JVD- none , edema- none, stasis changes- none, varices- none           Lung- clear to P&A, wheeze- none, cough- none , dullness-none, rub- none           Chest wall- sternotomy scar Abd- Br/ Gen/ Rectal- Not done, not indicated Extrem- cyanosis- none, clubbing, none, atrophy- none, strength- nl Neuro- grossly intact to observation

## 2022-09-30 ENCOUNTER — Ambulatory Visit: Payer: Medicare Other | Admitting: Internal Medicine

## 2022-09-30 ENCOUNTER — Encounter: Payer: Self-pay | Admitting: Internal Medicine

## 2022-09-30 VITALS — BP 130/70 | HR 82 | Ht 60.0 in | Wt 140.6 lb

## 2022-09-30 DIAGNOSIS — J3089 Other allergic rhinitis: Secondary | ICD-10-CM

## 2022-09-30 DIAGNOSIS — J302 Other seasonal allergic rhinitis: Secondary | ICD-10-CM | POA: Diagnosis not present

## 2022-09-30 DIAGNOSIS — R21 Rash and other nonspecific skin eruption: Secondary | ICD-10-CM

## 2022-09-30 DIAGNOSIS — J4530 Mild persistent asthma, uncomplicated: Secondary | ICD-10-CM

## 2022-09-30 MED ORDER — METHYLPREDNISOLONE ACETATE 80 MG/ML IJ SUSP
80.0000 mg | Freq: Once | INTRAMUSCULAR | Status: AC
  Administered 2022-09-30: 80 mg via INTRAMUSCULAR

## 2022-09-30 NOTE — Patient Instructions (Signed)
Order- Depo 80      dx rash  We can continue current meds for asthma and allergic nose  Please call if we can help

## 2022-10-13 ENCOUNTER — Encounter: Payer: Self-pay | Admitting: Internal Medicine

## 2022-10-26 NOTE — Assessment & Plan Note (Signed)
Mild intermittent uncomplicated. Plan-discussed use of rescue inhaler if needed.

## 2022-10-26 NOTE — Assessment & Plan Note (Signed)
I think she is noticing some minor vasomotor rhinitis related to weather and temperature changes right now, rather than early seasonal pollen.  We discussed options and decided to cover bases by giving Depo-Medrol which has helped her in the past but also discussed resumption of steroid nasal spray.

## 2022-11-03 ENCOUNTER — Telehealth: Payer: Self-pay | Admitting: Internal Medicine

## 2022-11-03 NOTE — Telephone Encounter (Signed)
Pt has PCP appt 4/5, lab appt 11/28/2022.  Please extend expiration date for existing orders.

## 2022-11-03 NOTE — Telephone Encounter (Signed)
Ok this is done thanks

## 2022-11-18 ENCOUNTER — Other Ambulatory Visit: Payer: Medicare Other

## 2022-11-25 ENCOUNTER — Ambulatory Visit: Payer: Medicare Other | Admitting: Internal Medicine

## 2022-11-28 ENCOUNTER — Other Ambulatory Visit (INDEPENDENT_AMBULATORY_CARE_PROVIDER_SITE_OTHER): Payer: Medicare Other

## 2022-11-28 DIAGNOSIS — E538 Deficiency of other specified B group vitamins: Secondary | ICD-10-CM | POA: Diagnosis not present

## 2022-11-28 DIAGNOSIS — E559 Vitamin D deficiency, unspecified: Secondary | ICD-10-CM

## 2022-11-28 DIAGNOSIS — R739 Hyperglycemia, unspecified: Secondary | ICD-10-CM | POA: Diagnosis not present

## 2022-11-28 LAB — BASIC METABOLIC PANEL
BUN: 18 mg/dL (ref 6–23)
CO2: 32 mEq/L (ref 19–32)
Calcium: 9.1 mg/dL (ref 8.4–10.5)
Chloride: 100 mEq/L (ref 96–112)
Creatinine, Ser: 0.83 mg/dL (ref 0.40–1.20)
GFR: 64.26 mL/min (ref >60.00)
Glucose, Bld: 101 mg/dL — ABNORMAL HIGH (ref 70–99)
Potassium: 3.8 mEq/L (ref 3.5–5.1)
Sodium: 137 mEq/L (ref 135–145)

## 2022-11-28 LAB — HEPATIC FUNCTION PANEL
ALT: 21 U/L (ref 0–35)
AST: 31 U/L (ref 0–37)
Albumin: 4.1 g/dL (ref 3.5–5.2)
Alkaline Phosphatase: 57 U/L (ref 39–117)
Bilirubin, Direct: 0.2 mg/dL (ref 0.0–0.3)
Total Bilirubin: 0.9 mg/dL (ref 0.2–1.2)
Total Protein: 7.2 g/dL (ref 6.0–8.3)

## 2022-11-28 LAB — CBC WITH DIFFERENTIAL/PLATELET
Basophils Absolute: 0 10*3/uL (ref 0.0–0.1)
Basophils Relative: 0.8 % (ref 0.0–3.0)
Eosinophils Absolute: 0.2 10*3/uL (ref 0.0–0.7)
Eosinophils Relative: 5.2 % — ABNORMAL HIGH (ref 0.0–5.0)
HCT: 40.2 % (ref 36.0–46.0)
Hemoglobin: 14.3 g/dL (ref 12.0–15.0)
Lymphocytes Relative: 27.4 % (ref 12.0–46.0)
Lymphs Abs: 1.2 10*3/uL (ref 0.7–4.0)
MCHC: 35.5 g/dL (ref 30.0–36.0)
MCV: 99.3 fl (ref 78.0–100.0)
Monocytes Absolute: 0.6 10*3/uL (ref 0.1–1.0)
Monocytes Relative: 13.7 % — ABNORMAL HIGH (ref 3.0–12.0)
Neutro Abs: 2.4 10*3/uL (ref 1.4–7.7)
Neutrophils Relative %: 52.9 % (ref 43.0–77.0)
Platelets: 219 10*3/uL (ref 150.0–400.0)
RBC: 4.05 Mil/uL (ref 3.87–5.11)
RDW: 13.1 % (ref 11.5–15.5)
WBC: 4.5 10*3/uL (ref 4.0–10.5)

## 2022-11-28 LAB — TSH: TSH: 2.33 u[IU]/mL (ref 0.35–5.50)

## 2022-11-28 LAB — URINALYSIS, ROUTINE W REFLEX MICROSCOPIC
Nitrite: NEGATIVE
Specific Gravity, Urine: 1.025 (ref 1.000–1.030)
Urine Glucose: NEGATIVE
Urobilinogen, UA: 0.2 (ref 0.0–1.0)
pH: 6 (ref 5.0–8.0)

## 2022-11-28 LAB — HEMOGLOBIN A1C: Hgb A1c MFr Bld: 5.4 % (ref 4.6–6.5)

## 2022-11-28 LAB — LIPID PANEL
Cholesterol: 172 mg/dL (ref 0–200)
HDL: 51.1 mg/dL (ref >39.00)
LDL Cholesterol: 91 mg/dL (ref 0–99)
NonHDL: 120.53
Total CHOL/HDL Ratio: 3
Triglycerides: 150 mg/dL — ABNORMAL HIGH (ref 0.0–149.0)
VLDL: 30 mg/dL (ref 0.0–40.0)

## 2022-11-28 LAB — MICROALBUMIN / CREATININE URINE RATIO
Creatinine,U: 391.8 mg/dL
Microalb Creat Ratio: 1.1 mg/g (ref 0.0–30.0)
Microalb, Ur: 4.5 mg/dL — ABNORMAL HIGH (ref 0.0–1.9)

## 2022-11-28 LAB — VITAMIN D 25 HYDROXY (VIT D DEFICIENCY, FRACTURES): VITD: 21.22 ng/mL — ABNORMAL LOW (ref 30.00–100.00)

## 2022-11-28 LAB — VITAMIN B12: Vitamin B-12: 187 pg/mL — ABNORMAL LOW (ref 211–911)

## 2022-12-02 ENCOUNTER — Ambulatory Visit: Payer: Medicare Other | Admitting: Internal Medicine

## 2022-12-02 VITALS — BP 150/74 | HR 68 | Temp 98.9°F | Ht 60.0 in | Wt 141.0 lb

## 2022-12-02 DIAGNOSIS — Z0001 Encounter for general adult medical examination with abnormal findings: Secondary | ICD-10-CM | POA: Diagnosis not present

## 2022-12-02 DIAGNOSIS — E538 Deficiency of other specified B group vitamins: Secondary | ICD-10-CM

## 2022-12-02 DIAGNOSIS — E559 Vitamin D deficiency, unspecified: Secondary | ICD-10-CM | POA: Diagnosis not present

## 2022-12-02 DIAGNOSIS — R739 Hyperglycemia, unspecified: Secondary | ICD-10-CM | POA: Diagnosis not present

## 2022-12-02 DIAGNOSIS — I10 Essential (primary) hypertension: Secondary | ICD-10-CM

## 2022-12-02 DIAGNOSIS — A059 Bacterial foodborne intoxication, unspecified: Secondary | ICD-10-CM

## 2022-12-02 MED ORDER — CYANOCOBALAMIN 1000 MCG/ML IJ SOLN
1000.0000 ug | Freq: Once | INTRAMUSCULAR | Status: AC
Start: 2022-12-02 — End: 2022-12-02
  Administered 2022-12-02: 1000 ug via INTRAMUSCULAR

## 2022-12-02 NOTE — Patient Instructions (Signed)
You had the B12 shot today  Please start making Nurse visit appointments every 2 months for repeat B12 shots  Ok to Double the Vit D3 you are already taking every day  Please continue all other medications as before, and refills have been done if requested.  Please have the pharmacy call with any other refills you may need.  Please continue your efforts at being more active, low cholesterol diet, and weight control.  You are otherwise up to date with prevention measures today.  Please keep your appointments with your specialists as you may have planned  Please make an Appointment to return in 6 months, or sooner if needed, also with Lab Appointment for testing done 3-5 days before at the FIRST FLOOR Lab (so this is for TWO appointments - please see the scheduling desk as you leave)

## 2022-12-02 NOTE — Progress Notes (Unsigned)
Patient ID: Sierra Robbins, female   DOB: 01/09/1935, 88 y.o.   MRN: [id number]         Chief Complaint:: wellness exam and food poisoning, htn, low b12, low vit d, AKI       HPI:  Sierra Robbins is a 88 y.o. female here for wellness exam; for shingrix at pharmacy, o/w up to date                        Also Pt denies chest pain, increased sob or doe, wheezing, orthopnea, PND, increased LE swelling, palpitations, dizziness or syncope.   Pt denies fever, wt loss, night sweats, loss of appetite, or other constitutional symptoms   Pt denies polydipsia, polyuria, or new focal neuro s/s.   Did have rather severe episode n/v/d after bad food it seems at local [employer] x 5 days, has been trying to push pedialyte in last few days and seems to feel overall much better.  BP has been mild elevated with illness, but prior to that seemed ok.  Today - Denies worsening reflux, abd pain, dysphagia, n/v, bowel change or blood.     Wt Readings from Last 3 Encounters:  12/02/22 141 lb (64 kg)  09/30/22 140 lb 9.6 oz (63.8 kg)  05/27/22 139 lb (63 kg)   BP Readings from Last 3 Encounters:  12/02/22 (!) 150/74  09/30/22 130/70  05/27/22 (!) 144/78   Immunization History  Administered Date(s) Administered   Fluad Quad(high Dose 65+) 06/13/2019, 05/27/2020, 05/27/2021, 05/27/2022   H1N1 07/29/2008   Influenza Split 05/29/2012, 05/25/2013, 05/29/2014   Influenza Whole 06/08/2007, 04/29/2008, 06/29/2010, 05/30/2011   Influenza, High Dose Seasonal PF 05/12/2016, 05/25/2017, 05/28/2018   Influenza-Unspecified 05/24/2015   PFIZER(Purple Top)SARS-COV-2 Vaccination 01/02/2020, 01/23/2020, 09/09/2020   Pneumococcal Conjugate-13 11/27/2013   Pneumococcal Polysaccharide-23 07/01/2002, 09/16/2008   Td 09/16/2008   Tdap 02/22/2019   Health Maintenance Due  Topic Date Due   Zoster Vaccines- Shingrix (1 of 2) Never done      Past Medical History:  Diagnosis Date   Allergic rhinitis    Asthma    Barrett's esophagus     COPD (chronic obstructive pulmonary disease)    DJD (degenerative joint disease)    hand   Endometriosis    GERD (gastroesophageal reflux disease)    History of shingles    Left upper arm/axilla   Hyperlipidemia    Hypertension    Osteoporosis    Vitamin D deficiency    Past Surgical History:  Procedure Laterality Date   ABDOMINAL HYSTERECTOMY  1979   APPENDECTOMY     CHOLECYSTECTOMY  1995   NASAL SEPTUM SURGERY     TRACHEOSTOMY     s/p temp; at 88 yo for croup   VESICOVAGINAL FISTULA CLOSURE W/ TAH      reports that she has never smoked. She has never used smokeless tobacco. She reports current alcohol use. She reports that she does not use drugs. family history includes Asthma in her sister; Heart attack in her mother; Rheum arthritis in her sister and sister; Stomach cancer in her maternal grandfather; Stroke in her father. Allergies  Allergen Reactions   Alendronate Sodium Other (See Comments)    REACTION: leg cramps   Atorvastatin Other (See Comments)    Myalgia, weakness in legs and arms   Ibandronate Sodium Other (See Comments)    REACTION: gi upset, and sluggish   Prednisone Swelling    Mouth and tongue swelling  Ramipril Swelling    Throat, mouth, and lip swelling   Statins Other (See Comments)    REACTION: myalgias; weakness in legs and arms   Current Outpatient Medications on File Prior to Visit  Medication Sig Dispense Refill   acetaminophen (TYLENOL) 325 MG tablet Take 162.5 mg by mouth every 6 (six) hours as needed for headache (pain).     albuterol (PROAIR HFA) 108 (90 Base) MCG/ACT inhaler inhale 2 puffs by mouth every 4 hours if needed for wheezing and shortness of breath 18 g PRN   amitriptyline (ELAVIL) 25 MG tablet Take 0.5 tablets (12.5 mg total) by mouth at bedtime as needed for sleep. 45 tablet 1   amoxicillin (AMOXIL) 500 MG tablet Take 1 tablet (500 mg total) by mouth 2 (two) times daily. 14 tablet 0   Ascorbic Acid (VITAMIN C PO) Take 0.5 mg  by mouth See admin instructions. Take 1/2 tablet by mouth three to four times monthly.     aspirin EC 81 MG tablet Take 81 mg by mouth daily with breakfast.     Cholecalciferol 50 MCG (2000 UT) TABS 1 tab by mouth once daily 30 tablet 99   Cod Liver Oil 1000 MG CAPS Take 1,000 mg by mouth daily with breakfast.     diphenhydrAMINE (BENADRYL) 25 MG tablet Take 12.5 mg by mouth every 6 (six) hours as needed for itching.     fluticasone (FLONASE) 50 MCG/ACT nasal spray Place 1 spray into both nostrils daily as needed for allergies or rhinitis.     hydrochlorothiazide (HYDRODIURIL) 25 MG tablet 1 tab by mouth once daily 90 tablet 3   hydrocortisone cream 1 % Apply 1 application topically 2 (two) times daily as needed for itching.     ibuprofen (ADVIL) 200 MG tablet Take 200 mg by mouth every 6 (six) hours as needed for headache (pain).     losartan (COZAAR) 50 MG tablet Take 1 tablet (50 mg total) by mouth daily. 90 tablet 3   Multiple Vitamin (MULTIVITAMIN WITH MINERALS) TABS tablet Take 1 tablet by mouth daily with breakfast. Centrum A-Z     vitamin E 400 UNIT capsule Take 400 Units by mouth daily with breakfast.      No current facility-administered medications on file prior to visit.        ROS:  All others reviewed and negative.  Objective        PE:  BP (!) 150/74   Pulse 68   Temp 98.9 F (37.2 C) (Oral)   Ht 5' (1.524 m)   Wt 141 lb (64 kg)   SpO2 92%   BMI 27.54 kg/m                 Constitutional: Pt appears in NAD               HENT: Head: NCAT.                Right Ear: External ear normal.                 Left Ear: External ear normal.                Eyes: . Pupils are equal, round, and reactive to light. Conjunctivae and EOM are normal               Nose: without d/c or deformity               Neck: Neck supple. Michaell Cowing  normal ROM               Cardiovascular: Normal rate and regular rhythm.                 Pulmonary/Chest: Effort normal and breath sounds without rales or  wheezing.                Abd:  Soft, NT, ND, + BS, no organomegaly               Neurological: Pt is alert. At baseline orientation, motor grossly intact               Skin: Skin is warm. No rashes, no other new lesions, LE edema - none               Psychiatric: Pt behavior is normal without agitation   Micro: none  Cardiac tracings I have personally interpreted today:  none  Pertinent Radiological findings (summarize): none   Lab Results  Component Value Date   WBC 4.5 11/28/2022   HGB 14.3 11/28/2022   HCT 40.2 11/28/2022   PLT 219.0 11/28/2022   GLUCOSE 101 (H) 11/28/2022   CHOL 172 11/28/2022   TRIG 150.0 (H) 11/28/2022   HDL 51.10 11/28/2022   LDLDIRECT 113.0 11/19/2021   LDLCALC 91 11/28/2022   ALT 21 11/28/2022   AST 31 11/28/2022   NA 137 11/28/2022   K 3.8 11/28/2022   CL 100 11/28/2022   CREATININE 0.83 11/28/2022   BUN 18 11/28/2022   CO2 32 11/28/2022   TSH 2.33 11/28/2022   HGBA1C 5.4 11/28/2022   MICROALBUR 4.5 (H) 11/28/2022   Assessment/Plan:  Sierra Robbins is a 88 y.o. White or Caucasian [1] female with  has a past medical history of Allergic rhinitis, Asthma, Barrett's esophagus, COPD (chronic obstructive pulmonary disease), DJD (degenerative joint disease), Endometriosis, GERD (gastroesophageal reflux disease), History of shingles, Hyperlipidemia, Hypertension, Osteoporosis, and Vitamin D deficiency.  Encounter for well adult exam with abnormal findings Age and sex appropriate education and counseling updated with regular exercise and diet Referrals for preventative services - none needed Immunizations addressed - for shingrix at pharmacy Smoking counseling  - none needed Evidence for depression or other mood disorder - none significant Most recent labs reviewed. I have personally reviewed and have noted: 1) the patient's medical and social history 2) The patient's current medications and supplements 3) The patient's height, weight, and BMI have  been recorded in the chart   B12 deficiency Lab Results  Component Value Date   VITAMINB12 187 (L) 11/28/2022   Low, to start oral replacement - b12 1000 mcg qd   Hyperglycemia Lab Results  Component Value Date   HGBA1C 5.4 11/28/2022   Stable, pt to continue current medical treatment  - diet, wt control   Vitamin D deficiency Last vitamin D Lab Results  Component Value Date   VD25OH 21.22 (L) 11/28/2022   Low to start oral replacement   Hypertension, uncontrolled BP Readings from Last 3 Encounters:  12/02/22 (!) 150/74  09/30/22 130/70  05/27/22 (!) 144/78   Uncontrolled, likely reactive,, pt to continue medical treatment hct 25 qd, losartan 50 qd    Food poisoning Clinically resolved, cont to push oral fluids to resolve current mild AKI  Followup: Return in about 6 months (around 06/03/2023).  Oliver BarreJames Tolbert Matheson, MD 12/04/2022 8:04 PM [facility]

## 2022-12-04 ENCOUNTER — Encounter: Payer: Self-pay | Admitting: Internal Medicine

## 2022-12-04 DIAGNOSIS — A059 Bacterial foodborne intoxication, unspecified: Secondary | ICD-10-CM | POA: Insufficient documentation

## 2022-12-04 NOTE — Assessment & Plan Note (Signed)
Clinically resolved, cont to push oral fluids to resolve current mild AKI

## 2022-12-04 NOTE — Assessment & Plan Note (Signed)
Lab Results  Component Value Date   VITAMINB12 187 (L) 11/28/2022   Low, to start oral replacement - b12 1000 mcg qd

## 2022-12-04 NOTE — Assessment & Plan Note (Signed)
Last vitamin D Lab Results  Component Value Date   VD25OH 21.22 (L) 11/28/2022   Low to start oral replacement

## 2022-12-04 NOTE — Assessment & Plan Note (Signed)
Lab Results  Component Value Date   HGBA1C 5.4 11/28/2022   Stable, pt to continue current medical treatment  - diet, wt control

## 2022-12-04 NOTE — Assessment & Plan Note (Signed)

## 2022-12-04 NOTE — Assessment & Plan Note (Signed)
BP Readings from Last 3 Encounters:  12/02/22 (!) 150/74  09/30/22 130/70  05/27/22 (!) 144/78   Uncontrolled, likely reactive,, pt to continue medical treatment hct 25 qd, losartan 50 qd

## 2022-12-04 NOTE — Addendum Note (Signed)
Addended by: Corwin Levins on: 12/04/2022 08:06 PM   Modules accepted: Orders

## 2022-12-05 ENCOUNTER — Telehealth: Payer: Self-pay | Admitting: Internal Medicine

## 2022-12-05 NOTE — Telephone Encounter (Signed)
Called pt no answer LMOM w/MD response../lmb 

## 2022-12-05 NOTE — Telephone Encounter (Signed)
Pt stated that she had a B12 shot and after that she notice her head started hurting along with bloating of the stomach to the point it felt like her stomach was going to pop. Pt haven't eaten to feel this way that day. Pt would like a nurse to give her a call back with advice. Pt also stated if she don't answer please leave a VM.  Best call back # is [phone]

## 2022-12-05 NOTE — Telephone Encounter (Signed)
This is not likely due to the B12 shot, so I would say ok to continue this.

## 2023-02-01 ENCOUNTER — Ambulatory Visit: Payer: Medicare Other

## 2023-04-05 ENCOUNTER — Ambulatory Visit: Payer: Medicare Other

## 2023-05-12 ENCOUNTER — Telehealth: Payer: Self-pay | Admitting: Internal Medicine

## 2023-05-12 NOTE — Telephone Encounter (Signed)
PT calling about req for Amoxicillin. This is a seasonal needs that she says Dr. Maple Hudson is aware of and always approved. Pharm has fax'd in req.    Her pharm is [facility] on [address] (This may be a new Pharm so be careful w/transmission. Thanks.)

## 2023-05-18 MED ORDER — AMOXICILLIN 500 MG PO TABS: 500.0000 mg | ORAL_TABLET | Freq: Two times a day (BID) | ORAL | 0 refills | Status: DC

## 2023-05-18 NOTE — Telephone Encounter (Signed)
Amoxicillin refilled.

## 2023-05-19 NOTE — Telephone Encounter (Signed)
nfn

## 2023-06-07 ENCOUNTER — Ambulatory Visit: Payer: Medicare Other | Admitting: Internal Medicine

## 2023-06-07 ENCOUNTER — Encounter: Payer: Self-pay | Admitting: Internal Medicine

## 2023-06-07 VITALS — BP 168/78 | HR 72 | Temp 97.8°F | Ht 60.0 in | Wt 142.0 lb

## 2023-06-07 DIAGNOSIS — I10 Essential (primary) hypertension: Secondary | ICD-10-CM | POA: Diagnosis not present

## 2023-06-07 DIAGNOSIS — E538 Deficiency of other specified B group vitamins: Secondary | ICD-10-CM

## 2023-06-07 DIAGNOSIS — E559 Vitamin D deficiency, unspecified: Secondary | ICD-10-CM | POA: Diagnosis not present

## 2023-06-07 DIAGNOSIS — R739 Hyperglycemia, unspecified: Secondary | ICD-10-CM | POA: Diagnosis not present

## 2023-06-07 MED ORDER — LOSARTAN POTASSIUM 50 MG PO TABS
50.0000 mg | ORAL_TABLET | Freq: Every day | ORAL | 3 refills | Status: DC
Start: 2023-06-07 — End: 2023-12-06

## 2023-06-07 NOTE — Progress Notes (Signed)
Patient ID: Sierra Robbins, female   DOB: [birth year], 88 y.o.   MRN: [id number]        Chief Complaint: follow up HTN, low B12, lo vit d, hyperglycemia       HPI:  Sierra Robbins is a 88 y.o. female here overall doing ok,  Pt denies chest pain, increased sob or doe, wheezing, orthopnea, PND, increased LE swelling, palpitations, dizziness or syncope.   Pt denies polydipsia, polyuria, or new focal neuro s/s.    Pt denies fever, wt loss, night sweats, loss of appetite, or other constitutional symptoms  BP at home also mildly high. No yet started losartan.      Wt Readings from Last 3 Encounters:  06/07/23 142 lb (64.4 kg)  12/02/22 141 lb (64 kg)  09/30/22 140 lb 9.6 oz (63.8 kg)   BP Readings from Last 3 Encounters:  06/07/23 (!) 168/78  12/02/22 (!) 150/74  09/30/22 130/70         Past Medical History:  Diagnosis Date   Allergic rhinitis    Asthma    Barrett's esophagus    COPD (chronic obstructive pulmonary disease) (HCC)    DJD (degenerative joint disease)    hand   Endometriosis    GERD (gastroesophageal reflux disease)    History of shingles    Left upper arm/axilla   Hyperlipidemia    Hypertension    Osteoporosis    Vitamin D deficiency    Past Surgical History:  Procedure Laterality Date   ABDOMINAL HYSTERECTOMY  1979   APPENDECTOMY     CHOLECYSTECTOMY  1995   NASAL SEPTUM SURGERY     TRACHEOSTOMY     s/p temp; at 88 yo for croup   VESICOVAGINAL FISTULA CLOSURE W/ TAH      reports that she has never smoked. She has never used smokeless tobacco. She reports current alcohol use. She reports that she does not use drugs. family history includes Asthma in her sister; Heart attack in her mother; Rheum arthritis in her sister and sister; Stomach cancer in her maternal grandfather; Stroke in her father. Allergies  Allergen Reactions   Alendronate Sodium Other (See Comments)    REACTION: leg cramps   Atorvastatin Other (See Comments)    Myalgia, weakness in legs and  arms   Ibandronate Sodium Other (See Comments)    REACTION: gi upset, and sluggish   Prednisone Swelling    Mouth and tongue swelling   Ramipril Swelling    Throat, mouth, and lip swelling   Statins Other (See Comments)    REACTION: myalgias; weakness in legs and arms   Current Outpatient Medications on File Prior to Visit  Medication Sig Dispense Refill   acetaminophen (TYLENOL) 325 MG tablet Take 162.5 mg by mouth every 6 (six) hours as needed for headache (pain).     albuterol (PROAIR HFA) 108 (90 Base) MCG/ACT inhaler inhale 2 puffs by mouth every 4 hours if needed for wheezing and shortness of breath 18 g PRN   amitriptyline (ELAVIL) 25 MG tablet Take 0.5 tablets (12.5 mg total) by mouth at bedtime as needed for sleep. 45 tablet 1   amoxicillin (AMOXIL) 500 MG tablet Take 1 tablet (500 mg total) by mouth 2 (two) times daily. 14 tablet 0   Ascorbic Acid (VITAMIN C PO) Take 0.5 mg by mouth See admin instructions. Take 1/2 tablet by mouth three to four times monthly.     aspirin EC 81 MG tablet Take 81 mg by mouth  daily with breakfast.     Cholecalciferol 50 MCG (2000 UT) TABS 1 tab by mouth once daily 30 tablet 99   Cod Liver Oil 1000 MG CAPS Take 1,000 mg by mouth daily with breakfast.     diphenhydrAMINE (BENADRYL) 25 MG tablet Take 12.5 mg by mouth every 6 (six) hours as needed for itching.     fluticasone (FLONASE) 50 MCG/ACT nasal spray Place 1 spray into both nostrils daily as needed for allergies or rhinitis.     hydrochlorothiazide (HYDRODIURIL) 25 MG tablet 1 tab by mouth once daily 90 tablet 3   hydrocortisone cream 1 % Apply 1 application topically 2 (two) times daily as needed for itching.     ibuprofen (ADVIL) 200 MG tablet Take 200 mg by mouth every 6 (six) hours as needed for headache (pain).     Multiple Vitamin (MULTIVITAMIN WITH MINERALS) TABS tablet Take 1 tablet by mouth daily with breakfast. Centrum A-Z     vitamin E 400 UNIT capsule Take 400 Units by mouth daily  with breakfast.      No current facility-administered medications on file prior to visit.        ROS:  All others reviewed and negative.  Objective        PE:  BP (!) 168/78 (BP Location: Left Arm, Patient Position: Sitting, Cuff Size: Normal)   Pulse 72   Temp 97.8 F (36.6 C) (Oral)   Ht 5' (1.524 m)   Wt 142 lb (64.4 kg)   SpO2 97%   BMI 27.73 kg/m                 Constitutional: Pt appears in NAD               HENT: Head: NCAT.                Right Ear: External ear normal.                 Left Ear: External ear normal.                Eyes: . Pupils are equal, round, and reactive to light. Conjunctivae and EOM are normal               Nose: without d/c or deformity               Neck: Neck supple. Gross normal ROM               Cardiovascular: Normal rate and regular rhythm.                 Pulmonary/Chest: Effort normal and breath sounds without rales or wheezing.                Abd:  Soft, NT, ND, + BS, no organomegaly               Neurological: Pt is alert. At baseline orientation, motor grossly intact               Skin: Skin is warm. No rashes, no other new lesions, LE edema - none               Psychiatric: Pt behavior is normal without agitation   Micro: none  Cardiac tracings I have personally interpreted today:  none  Pertinent Radiological findings (summarize): none   Lab Results  Component Value Date   WBC 4.5 11/28/2022   HGB 14.3 11/28/2022   HCT 40.2  11/28/2022   PLT 219.0 11/28/2022   GLUCOSE 101 (H) 11/28/2022   CHOL 172 11/28/2022   TRIG 150.0 (H) 11/28/2022   HDL 51.10 11/28/2022   LDLDIRECT 113.0 11/19/2021   LDLCALC 91 11/28/2022   ALT 21 11/28/2022   AST 31 11/28/2022   NA 137 11/28/2022   K 3.8 11/28/2022   CL 100 11/28/2022   CREATININE 0.83 11/28/2022   BUN 18 11/28/2022   CO2 32 11/28/2022   TSH 2.33 11/28/2022   HGBA1C 5.4 11/28/2022   MICROALBUR 4.5 (H) 11/28/2022   Assessment/Plan:  Sierra Robbins is a 88 y.o. White or  Caucasian [1] female with  has a past medical history of Allergic rhinitis, Asthma, Barrett's esophagus, COPD (chronic obstructive pulmonary disease) (HCC), DJD (degenerative joint disease), Endometriosis, GERD (gastroesophageal reflux disease), History of shingles, Hyperlipidemia, Hypertension, Osteoporosis, and Vitamin D deficiency.  Vitamin D deficiency Last vitamin D Lab Results  Component Value Date   VD25OH 21.22 (L) 11/28/2022   Low, to start oral replacement   Hyperglycemia Lab Results  Component Value Date   HGBA1C 5.4 11/28/2022   Stable, pt to continue current medical treatment  - diet, wt control   B12 deficiency Lab Results  Component Value Date   VITAMINB12 187 (L) 11/28/2022   Low, to start oral replacement - b12 1000 mcg qd   Hypertension, uncontrolled BP Readings from Last 3 Encounters:  06/07/23 (!) 168/78  12/02/22 (!) 150/74  09/30/22 130/70   uncontrolled pt to continue medical treatment hct 25 every day and add losartan 50 qd  Followup: Return in about 6 months (around 12/06/2023).  Oliver Barre, MD 06/09/2023 9:36 PM [facility]

## 2023-06-07 NOTE — Patient Instructions (Signed)
Ok to take the losartan 50 mg as prescribed  Please take OTC Vitamin D3 at 2000 units per day, indefinitely, and the B12 in a Multivitamin  Please continue all other medications as before, and refills have been done if requested.  Please have the pharmacy call with any other refills you may need.  Please keep your appointments with your specialists as you may have planned  Please make an Appointment to return in 6 months, or sooner if needed, also with Lab Appointment for testing done 3-5 days before at the FIRST FLOOR Lab (so this is for TWO appointments - please see the scheduling desk as you leave)

## 2023-06-09 ENCOUNTER — Encounter: Payer: Self-pay | Admitting: Internal Medicine

## 2023-06-09 NOTE — Assessment & Plan Note (Signed)
Last vitamin D Lab Results  Component Value Date   VD25OH 21.22 (L) 11/28/2022   Low, to start oral replacement

## 2023-06-09 NOTE — Assessment & Plan Note (Signed)
BP Readings from Last 3 Encounters:  06/07/23 (!) 168/78  12/02/22 (!) 150/74  09/30/22 130/70   uncontrolled pt to continue medical treatment hct 25 every day and add losartan 50 qd

## 2023-06-09 NOTE — Assessment & Plan Note (Signed)
Lab Results  Component Value Date   HGBA1C 5.4 11/28/2022   Stable, pt to continue current medical treatment  - diet, wt control

## 2023-06-09 NOTE — Assessment & Plan Note (Signed)
Lab Results  Component Value Date   VITAMINB12 187 (L) 11/28/2022   Low, to start oral replacement - b12 1000 mcg qd

## 2023-06-13 ENCOUNTER — Telehealth: Payer: Self-pay

## 2023-06-13 ENCOUNTER — Ambulatory Visit (INDEPENDENT_AMBULATORY_CARE_PROVIDER_SITE_OTHER): Payer: Medicare Other

## 2023-06-13 VITALS — Ht 60.0 in | Wt 142.0 lb

## 2023-06-13 DIAGNOSIS — Z0000 Encounter for general adult medical examination without abnormal findings: Secondary | ICD-10-CM

## 2023-06-13 MED ORDER — HYDROCHLOROTHIAZIDE 25 MG PO TABS: ORAL_TABLET | ORAL | 3 refills | Status: DC

## 2023-06-13 NOTE — Telephone Encounter (Signed)
Already done - see "order review" tab

## 2023-06-13 NOTE — Patient Instructions (Addendum)
Sierra Robbins , Thank you for taking time to come for your Medicare Wellness Visit. I appreciate your ongoing commitment to your health goals. Please review the following plan we discussed and let me know if I can assist you in the future.   Referrals/Orders/Follow-Ups/Clinician Recommendations: No  This is a list of the screening recommended for you and due dates:  Health Maintenance  Topic Date Due   Medicare Annual Wellness Visit  06/12/2024   DTaP/Tdap/Td vaccine (3 - Td or Tdap) 02/21/2029   Pneumonia Vaccine  Completed   Flu Shot  Completed   DEXA scan (bone density measurement)  Completed   Zoster (Shingles) Vaccine  Completed   HPV Vaccine  Aged Out   COVID-19 Vaccine  Discontinued    Advanced directives: (Copy Requested) Please bring a copy of your health care power of attorney and living will to the office to be added to your chart at your convenience. Will check on status of paperwork that patient stated that she had scanned at provider office.  Next Medicare Annual Wellness Visit scheduled for next year: Yes

## 2023-06-13 NOTE — Progress Notes (Signed)
Subjective:   Sierra Robbins is a 88 y.o. female who presents for Medicare Annual (Subsequent) preventive examination.  Visit Complete: Virtual I connected with  Gerlean Ren on 06/13/23 by a audio enabled telemedicine application and verified that I am speaking with the correct person using two identifiers.  Patient Location: Home  Provider Location: Office/Clinic  I discussed the limitations of evaluation and management by telemedicine. The patient expressed understanding and agreed to proceed.  Vital Signs: Because this visit was a virtual/telehealth visit, some criteria may be missing or patient reported. Any vitals not documented were not able to be obtained and vitals that have been documented are patient reported.  Cardiac Risk Factors include: advanced age (>67men, >77 women);dyslipidemia;hypertension;family history of premature cardiovascular disease     Objective:    Today's Vitals   06/13/23 1302  Weight: 142 lb (64.4 kg)  Height: 5' (1.524 m)  PainSc: 0-No pain   Body mass index is 27.73 kg/m.     06/13/2023    1:07 PM 06/06/2022    1:05 PM 06/04/2021    9:31 AM 02/25/2020   10:15 AM 11/08/2019    8:22 PM  Advanced Directives  Does Patient Have a Medical Advance Directive? Yes Yes Yes Yes No  Type of Estate agent of [place];Living will Healthcare Power of [place];Living will Living will;Healthcare Power of [employer] Power of [place];Living will   Does patient want to make changes to medical advance directive?   No - Patient declined No - Patient declined   Copy of Healthcare Power of Attorney in Chart? No - copy requested No - copy requested  No - copy requested   Would patient like information on creating a medical advance directive?     No - Patient declined    Current Medications (verified) Outpatient Encounter Medications as of 06/13/2023  Medication Sig   acetaminophen (TYLENOL) 325 MG tablet Take 162.5 mg by mouth  every 6 (six) hours as needed for headache (pain).   albuterol (PROAIR HFA) 108 (90 Base) MCG/ACT inhaler inhale 2 puffs by mouth every 4 hours if needed for wheezing and shortness of breath   amitriptyline (ELAVIL) 25 MG tablet Take 0.5 tablets (12.5 mg total) by mouth at bedtime as needed for sleep.   amoxicillin (AMOXIL) 500 MG tablet Take 1 tablet (500 mg total) by mouth 2 (two) times daily.   Ascorbic Acid (VITAMIN C PO) Take 0.5 mg by mouth See admin instructions. Take 1/2 tablet by mouth three to four times monthly.   aspirin EC 81 MG tablet Take 81 mg by mouth daily with breakfast.   Cholecalciferol 50 MCG (2000 UT) TABS 1 tab by mouth once daily   Cod Liver Oil 1000 MG CAPS Take 1,000 mg by mouth daily with breakfast.   diphenhydrAMINE (BENADRYL) 25 MG tablet Take 12.5 mg by mouth every 6 (six) hours as needed for itching.   fluticasone (FLONASE) 50 MCG/ACT nasal spray Place 1 spray into both nostrils daily as needed for allergies or rhinitis.   hydrochlorothiazide (HYDRODIURIL) 25 MG tablet 1 tab by mouth once daily   hydrocortisone cream 1 % Apply 1 application topically 2 (two) times daily as needed for itching.   ibuprofen (ADVIL) 200 MG tablet Take 200 mg by mouth every 6 (six) hours as needed for headache (pain).   losartan (COZAAR) 50 MG tablet Take 1 tablet (50 mg total) by mouth daily.   Multiple Vitamin (MULTIVITAMIN WITH MINERALS) TABS tablet Take 1  tablet by mouth daily with breakfast. Centrum A-Z   vitamin E 400 UNIT capsule Take 400 Units by mouth daily with breakfast.    [DISCONTINUED] hydrochlorothiazide (HYDRODIURIL) 25 MG tablet 1 tab by mouth once daily   No facility-administered encounter medications on file as of 06/13/2023.    Allergies (verified) Alendronate sodium, Atorvastatin, Ibandronate sodium, Prednisone, Ramipril, and Statins   History: Past Medical History:  Diagnosis Date   Allergic rhinitis    Asthma    Barrett's esophagus    COPD (chronic  obstructive pulmonary disease) (HCC)    DJD (degenerative joint disease)    hand   Endometriosis    GERD (gastroesophageal reflux disease)    History of shingles    Left upper arm/axilla   Hyperlipidemia    Hypertension    Osteoporosis    Vitamin D deficiency    Past Surgical History:  Procedure Laterality Date   ABDOMINAL HYSTERECTOMY  1979   APPENDECTOMY     CHOLECYSTECTOMY  1995   NASAL SEPTUM SURGERY     TRACHEOSTOMY     s/p temp; at 88 yo for croup   VESICOVAGINAL FISTULA CLOSURE W/ TAH     Family History  Problem Relation Age of Onset   Stroke Father    Heart attack Mother    Asthma Sister    Stomach cancer Maternal Grandfather    Rheum arthritis Sister    Rheum arthritis Sister    Social History   Socioeconomic History   Marital status: Divorced    Spouse name: Not on file   Number of children: 1   Years of education: Not on file   Highest education level: Not on file  Occupational History   Occupation: [employer]    Employer: [employer]  Tobacco Use   Smoking status: Never   Smokeless tobacco: Never  Vaping Use   Vaping status: Never Used  Substance and Sexual Activity   Alcohol use: Yes    Comment: during the holidays   Drug use: No   Sexual activity: Not Currently  Other Topics Concern   Not on file  Social History Narrative   Not on file   Social Determinants of Health   Financial Resource Strain: Low Risk  (06/13/2023)   Overall Financial Resource Strain (CARDIA)    Difficulty of Paying Living Expenses: Not hard at all  Food Insecurity: No Food Insecurity (06/13/2023)   Hunger Vital Sign    Worried About Running Out of Food in the Last Year: Never true    Ran Out of Food in the Last Year: Never true  Transportation Needs: No Transportation Needs (06/13/2023)   PRAPARE - Administrator, Civil Service (Medical): No    Lack of Transportation (Non-Medical): No  Physical Activity: Sufficiently Active (06/13/2023)    Exercise Vital Sign    Days of Exercise per Week: 7 days    Minutes of Exercise per Session: 30 min  Stress: No Stress Concern Present (06/13/2023)   [employer] of Occupational Health - Occupational Stress Questionnaire    Feeling of Stress : Not at all  Social Connections: Moderately Integrated (06/13/2023)   Social Connection and Isolation Panel [NHANES]    Frequency of Communication with Friends and Family: More than three times a week    Frequency of Social Gatherings with Friends and Family: More than three times a week    Attends Religious Services: More than 4 times per year    Active Member of [employer]  or Organizations: Yes    Attends Banker Meetings: 1 to 4 times per year    Marital Status: Widowed    Tobacco Counseling Counseling given: Not Answered   Clinical Intake:  Pre-visit preparation completed: Yes  Pain : No/denies pain Pain Score: 0-No pain     BMI - recorded: 27.73 Nutritional Status: BMI 25 -29 Overweight Nutritional Risks: None Diabetes: No  How often do you need to have someone help you when you read instructions, pamphlets, or other written materials from your doctor or pharmacy?: 1 - Never What is the last grade level you completed in school?: HSG; INSURANCE CLASSES  Interpreter Needed?: No  Information entered by :: Katha Kuehne N. Adriell Polansky, LPN.   Activities of Daily Living    06/13/2023    1:12 PM  In your present state of health, do you have any difficulty performing the following activities:  Hearing? 0  Vision? 0  Difficulty concentrating or making decisions? 0  Walking or climbing stairs? 0  Dressing or bathing? 0  Doing errands, shopping? 0  Preparing Food and eating ? N  Using the Toilet? N  In the past six months, have you accidently leaked urine? N  Do you have problems with loss of bowel control? N  Managing your Medications? N  Managing your Finances? N  Housekeeping or managing your Housekeeping? N     Patient Care Team: Corwin Levins, MD as PCP - General  Indicate any recent Medical Services you may have received from other than [facility] providers in the past year (date may be approximate).     Assessment:   This is a routine wellness examination for [facility].  Hearing/Vision screen Hearing Screening - Comments:: Patient denied any hearing difficulty.   No hearing aids.   Vision Screening - Comments:: Patient does wear corrective lenses/contacts.  Annual eye exam done by: [facility]     Goals Addressed   None   Depression Screen    06/13/2023    1:09 PM 06/07/2023    9:56 AM 12/02/2022   10:03 AM 06/06/2022    1:08 PM 05/27/2022    9:03 AM 11/24/2021    9:36 AM 11/24/2021    9:08 AM  PHQ 2/9 Scores  PHQ - 2 Score 0 0 0 0 0 0 0  PHQ- 9 Score 0 0 0 0 0      Fall Risk    06/13/2023    1:09 PM 06/07/2023    9:56 AM 06/06/2022    1:08 PM 05/27/2022    9:03 AM 11/24/2021    9:36 AM  Fall Risk   Falls in the past year? 0 0 0 0 0  Number falls in past yr: 0 0 0  0  Injury with Fall? 0 0 0  0  Risk for fall due to : No Fall Risks No Fall Risks No Fall Risks No Fall Risks   Follow up Falls prevention discussed Falls evaluation completed Falls prevention discussed Falls evaluation completed     MEDICARE RISK AT HOME: Medicare Risk at Home Any stairs in or around the home?: Yes (sunken room from kitchen to laundry room; with a railing) If so, are there any without handrails?: No Home free of loose throw rugs in walkways, pet beds, electrical cords, etc?: Yes Adequate lighting in your home to reduce risk of falls?: Yes Life alert?: No Use of a cane, walker or w/c?: No Grab bars in the bathroom?: Yes Shower chair or bench  in shower?: No Elevated toilet seat or a handicapped toilet?: Yes  TIMED UP AND GO:  Was the test performed?  No    Cognitive Function:    06/13/2023    1:14 PM 02/25/2020   10:17 AM  MMSE - Mini Mental State Exam  Not completed: Unable  to complete Refused        06/13/2023    1:12 PM 06/06/2022    1:08 PM  6CIT Screen  What Year? 0 points 0 points  What month? 0 points 0 points  What time? 0 points 0 points  Count back from 20 0 points 0 points  Months in reverse 0 points 0 points  Repeat phrase 0 points 0 points  Total Score 0 points 0 points    Immunizations Immunization History  Administered Date(s) Administered   Fluad Quad(high Dose 65+) 06/13/2019, 05/27/2020, 05/27/2021, 05/27/2022   H1N1 07/29/2008   Influenza Split 05/29/2012, 05/25/2013, 05/29/2014   Influenza Whole 06/08/2007, 04/29/2008, 06/29/2010, 05/30/2011   Influenza, High Dose Seasonal PF 05/12/2016, 05/25/2017, 05/28/2018   Influenza-Unspecified 05/24/2015, 06/03/2023   PFIZER(Purple Top)SARS-COV-2 Vaccination 01/02/2020, 01/23/2020, 09/09/2020   Pneumococcal Conjugate-13 11/27/2013   Pneumococcal Polysaccharide-23 07/01/2002, 09/16/2008   Td 09/16/2008   Tdap 02/22/2019   Zoster Recombinant(Shingrix) 12/18/2022, 03/13/2023    TDAP status: Up to date  Flu Vaccine status: Up to date  Pneumococcal vaccine status: Up to date  Covid-19 vaccine status: Completed vaccines  Qualifies for Shingles Vaccine? Yes   Zostavax completed No   Shingrix Completed?: Yes  Screening Tests Health Maintenance  Topic Date Due   Medicare Annual Wellness (AWV)  06/12/2024   DTaP/Tdap/Td (3 - Td or Tdap) 02/21/2029   Pneumonia Vaccine 64+ Years old  Completed   INFLUENZA VACCINE  Completed   DEXA SCAN  Completed   Zoster Vaccines- Shingrix  Completed   HPV VACCINES  Aged Out   COVID-19 Vaccine  Discontinued    Health Maintenance  There are no preventive care reminders to display for this patient.   Colorectal cancer screening: No longer required.   Mammogram status: No longer required due to age.  Bone Density status: No longer required due to age.  Lung Cancer Screening: (Low Dose CT Chest recommended if Age 73-80 years, 20 pack-year  currently smoking OR have quit w/in 15years.) does not qualify.   Lung Cancer Screening Referral: no  Additional Screening:  Hepatitis C Screening: does not qualify.  Vision Screening: Recommended annual ophthalmology exams for early detection of glaucoma and other disorders of the eye. Is the patient up to date with their annual eye exam?  Yes  Who is the provider or what is the name of the office in which the patient attends annual eye exams? [facility] If pt is not established with a provider, would they like to be referred to a provider to establish care? No .   Dental Screening: Recommended annual dental exams for proper oral hygiene  Diabetic Foot Exam: N/A  Community Resource Referral / Chronic Care Management: CRR required this visit?  No   CCM required this visit?  No     Plan:     I have personally reviewed and noted the following in the patient's chart:   Medical and social history Use of alcohol, tobacco or illicit drugs  Current medications and supplements including opioid prescriptions. Patient is not currently taking opioid prescriptions. Functional ability and status Nutritional status Physical activity Advanced directives List of other physicians Hospitalizations, surgeries, and ER visits  in previous 12 months Vitals Screenings to include cognitive, depression, and falls Referrals and appointments  In addition, I have reviewed and discussed with patient certain preventive protocols, quality metrics, and best practice recommendations. A written personalized care plan for preventive services as well as general preventive health recommendations were provided to patient.     Mickeal Needy, LPN   04/54/0981   After Visit Summary: (In Person-Printed) AVS printed and given to the patient  Nurse Notes: Normal cognitive status assessed by direct observation via telephone conversation by this Nurse Health Advisor. No abnormalities  found.

## 2023-06-13 NOTE — Telephone Encounter (Signed)
Patient is requesting lab order to be put in for her CPE scheduled for 12/06/2023.  Please advise.

## 2023-09-29 NOTE — Progress Notes (Unsigned)
HPI female never smoker followed for allergic rhinitis, asthma, complicated by GERD/Barrett's, HBP Office Spirometry 09/13/2016-moderate airway obstruction. FVC 1.71/79%, FEV1 1.08/67%, ratio 0.63, FEF 25-75 percent 0.49/40  --------------------------------------------------------------------------------  09/30/22- 89 year old female never smoker followed for allergic Rhinitis, Asthma, complicated by GERD/Barrett's, HTN, Arthritis,  ------f/u mild persistent asthma  -Proair hfa, Nasacort Covid vax-3 Phizer Flu vax-had ACT score- 24 -----Pt is doing great Blames recent cold air for some watery rhinorrhea but otherwise feels well and does not suspect infection.  Asthma is well-controlled.  She has not been needing to use her rescue inhaler or her nasal spray and we discussed these.  Does not think she needs refills now. Pruritic rash over the left scapula/shoulder in the past week.  She shows me a couple of slightly excoriated red spots but no obvious rash.  Very nonspecific and early shingles is not excluded.  I suspect it  is dry skin.  07/31/24- 89 year old female never smoker followed for allergic Rhinitis, Asthma, complicated by GERD/Barrett's, HTN, Arthritis,  ------f/u mild persistent asthma  -Proair hfa, Nasacort Asks we put her Living Will into Epic chart. Discussed the use of AI scribe software for clinical note transcription with the patient, who gave verbal consent to proceed.  History of Present Illness   The patient, with a history of asthma, presents with symptoms of seasonal allergies. She reports a recent bout of symptoms, primarily involving the head, with ear discomfort, throat irritation, and significant phlegm production. She manages her symptoms with an albuterol inhaler, Nasacort nose spray, Claritin, and Benadryl as needed. She reports a recent episode of illness lasting over a week, during which she gargled with a mixture of rubbing alcohol (discussed) and warm water, which  seemed to improve her symptoms.  The patient also reports a recurring issue with a hard, bloody-looking mucus plug that forms in her throat over a period of about three days. Once she is able to cough it up, she feels relief. She denies any other significant respiratory symptoms at this time. Her asthma appears to be well-controlled, and she has not required allergy shots for some time.       ROS-see HPI  + = positive Constitutional:   No-   weight loss, night sweats, fevers, chills, fatigue, lassitude. HEENT:   + headaches, no- difficulty swallowing, tooth/dental problems, sore throat,       +sneezing, no-itching, ear ache, +nasal congestion, + post nasal drip,  CV:  No-   chest pain, orthopnea, PND, swelling in lower extremities, anasarca, dizziness, palpitations Resp: No-   shortness of breath with exertion or at rest.              No-   productive cough,  No non-productive cough,  No- coughing up of blood.              No-   change in color of mucus.  + wheezing.   Skin: No-   rash or lesions. GI:  No-   heartburn, indigestion, abdominal pain, nausea, vomiting,  GU: MS:  No-   joint pain or swelling.   Neuro-     nothing unusual Psych:  No- change in mood or affect. No depression or anxiety.  No memory loss.  OBJ General- Alert, Oriented, Affect-appropriate, Distress- none acute Skin- + Minor excoriation left shoulder Lymphadenopathy- none Head- atraumatic            Eyes- Gross vision intact, PERRLA, conjunctivae clear secretions  Ears- Hearing, canals-normal            Nose- , no-Septal dev, mucus, polyps, erosion, perforation ,             Throat- Mallampati III , mucosa clear , drainage- none, tonsils- atrophic.                     + Dentures Neck- flexible , old sternal scar ( surgical complication of ?thyroid surgery?) , no stridor , thyroid nl, carotid no bruit Chest - symmetrical excursion , unlabored           Heart/CV- RRR , no murmur , no gallop  , no rub, nl  s1 s2                           - JVD- none , edema- none, stasis changes- none, varices- none           Lung- clear to P&A, wheeze- none, cough- none , dullness-none, rub- none           Chest wall- sternotomy scar Abd- Br/ Gen/ Rectal- Not done, not indicated Extrem- cyanosis- none, clubbing, none, atrophy- none, strength- nl Neuro- grossly intact to observation  Assessment and Plan    Allergic Rhinitis Seasonal exacerbation with postnasal drip and mucus plug formation. Currently managed with Nasacort, Claritin, and Benadryl as needed. -Continue current regimen.  Asthma No current symptoms. Managed with Albuterol inhaler as needed. -Refill Albuterol inhaler prescription.  Hemoptysis Reports occasional small, hard, bloody mucus plugs. Last chest x-ray was seven years ago. -Order chest x-ray to evaluate for any changes.  Advance Care Planning Patient brought in a copy of her living will, which was not previously in the medical record. -Scan and add the living will to the patient's medical record.

## 2023-10-02 ENCOUNTER — Ambulatory Visit: Payer: Medicare Other

## 2023-10-02 ENCOUNTER — Encounter: Payer: Self-pay | Admitting: Internal Medicine

## 2023-10-02 ENCOUNTER — Ambulatory Visit: Payer: Medicare Other | Admitting: Internal Medicine

## 2023-10-02 VITALS — BP 158/80 | HR 79 | Temp 98.0°F | Resp 18 | Ht 60.0 in | Wt 144.0 lb

## 2023-10-02 DIAGNOSIS — J4530 Mild persistent asthma, uncomplicated: Secondary | ICD-10-CM

## 2023-10-02 MED ORDER — ALBUTEROL SULFATE HFA 108 (90 BASE) MCG/ACT IN AERS: INHALATION_SPRAY | RESPIRATORY_TRACT | 99 refills | Status: AC

## 2023-10-02 NOTE — Patient Instructions (Addendum)
Order- CXR   dx asthma mild persistent uncomplicated  We will scan your Living Will into your chart

## 2023-10-11 ENCOUNTER — Telehealth: Payer: Self-pay | Admitting: Internal Medicine

## 2023-10-11 NOTE — Telephone Encounter (Signed)
Patient would like results of xray. Patient phone number is [phone].

## 2023-10-12 NOTE — Telephone Encounter (Signed)
Chest XRay looks ok by my reading, with no evident acute process. We are still waiting for radiology to give the official report.

## 2023-10-12 NOTE — Telephone Encounter (Signed)
Dr Maple Hudson,  Will you review cxr for results?

## 2023-10-12 NOTE — Telephone Encounter (Signed)
I called and spoke with pt. Pt was informed of Dr Roxy Cedar note. I also informed pt that this was not the official report on her cxr, and she was welcome to call our office back next week to see if radiology reviewed it yet. Pt verbalized understanding.NFN

## 2023-10-27 ENCOUNTER — Telehealth: Payer: Self-pay | Admitting: Internal Medicine

## 2023-10-27 NOTE — Telephone Encounter (Signed)
 Patient is returning missed call in regards to her chest x-ray results. Patient can be reached on her home phone at [phone]

## 2023-10-30 MED ORDER — AMOXICILLIN 500 MG PO TABS: 500.0000 mg | ORAL_TABLET | Freq: Two times a day (BID) | ORAL | 0 refills | Status: DC

## 2023-10-30 NOTE — Telephone Encounter (Signed)
 Amoxicillin script sent to [facility]

## 2023-10-30 NOTE — Addendum Note (Signed)
 Addended by: Jetty Duhamel D on: 10/30/2023 11:07 AM   Modules accepted: Orders

## 2023-10-30 NOTE — Telephone Encounter (Addendum)
 Called patient.  Gave cxr results.  Patient verbalized understanding.  Patient would like Dr. Maple Hudson to send in a rx for amoxicillin 500 mg.    Patient states she is having mucus in throat and back of sinus area.  It is "like a plug". It takes about 2 days to cough mucus up.  Mucus is yellow/white and hard with some blood.   Patient denies any fever.  No wheeze or SOB/dyspnea.  Patient states she has had this before and Amoxicillin helps with sx.  Pharmacy is [facility], [address], .  Dr. Maple Hudson, please advise.

## 2023-11-01 NOTE — Telephone Encounter (Signed)
 Called patient.  Notified patient Amoxicillin was called into [facility].  Patient verbalized understanding.

## 2023-11-13 ENCOUNTER — Telehealth: Payer: Self-pay

## 2023-11-13 NOTE — Telephone Encounter (Signed)
 Copied from CRM [id number]. Topic: Clinical - Lab/Test Results >> Nov 13, 2023  1:41 PM Denese Killings wrote: Reason for CRM: Patient received a letter regarding incorrect lab results. She states that she got labs last year.

## 2023-11-14 NOTE — Telephone Encounter (Signed)
 Hello - there was a software miscalculation at one point in your care that showed an increased amount of protein in the urine when this corrected.  This normally prompts efforts to help reduce this protein in the urine with certain medications.  You are already taking losartan which is the treatment in your case  No further change in treatment is needed at this time.   Thanks

## 2023-12-01 ENCOUNTER — Other Ambulatory Visit (INDEPENDENT_AMBULATORY_CARE_PROVIDER_SITE_OTHER)

## 2023-12-01 DIAGNOSIS — E538 Deficiency of other specified B group vitamins: Secondary | ICD-10-CM

## 2023-12-01 DIAGNOSIS — E559 Vitamin D deficiency, unspecified: Secondary | ICD-10-CM | POA: Diagnosis not present

## 2023-12-01 DIAGNOSIS — R739 Hyperglycemia, unspecified: Secondary | ICD-10-CM

## 2023-12-01 DIAGNOSIS — I10 Essential (primary) hypertension: Secondary | ICD-10-CM | POA: Diagnosis not present

## 2023-12-01 LAB — URINALYSIS, ROUTINE W REFLEX MICROSCOPIC
Bilirubin Urine: NEGATIVE
Hgb urine dipstick: NEGATIVE
Ketones, ur: NEGATIVE
Nitrite: NEGATIVE
Specific Gravity, Urine: >=1.03 — AB (ref 1.000–1.030)
Total Protein, Urine: NEGATIVE
Urine Glucose: NEGATIVE
Urobilinogen, UA: 0.2 (ref 0.0–1.0)
pH: 5.5 (ref 5.0–8.0)

## 2023-12-01 LAB — HEPATIC FUNCTION PANEL
ALT: 13 U/L (ref 0–35)
AST: 21 U/L (ref 0–37)
Albumin: 4.5 g/dL (ref 3.5–5.2)
Alkaline Phosphatase: 62 U/L (ref 39–117)
Bilirubin, Direct: 0.1 mg/dL (ref 0.0–0.3)
Total Bilirubin: 0.7 mg/dL (ref 0.2–1.2)
Total Protein: 7.6 g/dL (ref 6.0–8.3)

## 2023-12-01 LAB — CBC WITH DIFFERENTIAL/PLATELET
Basophils Absolute: 0.1 10*3/uL (ref 0.0–0.1)
Basophils Relative: 1.5 % (ref 0.0–3.0)
Eosinophils Absolute: 0.4 10*3/uL (ref 0.0–0.7)
Eosinophils Relative: 7.1 % — ABNORMAL HIGH (ref 0.0–5.0)
HCT: 39.8 % (ref 36.0–46.0)
Hemoglobin: 13.7 g/dL (ref 12.0–15.0)
Lymphocytes Relative: 37.5 % (ref 12.0–46.0)
Lymphs Abs: 2.1 10*3/uL (ref 0.7–4.0)
MCHC: 34.4 g/dL (ref 30.0–36.0)
MCV: 100.9 fl — ABNORMAL HIGH (ref 78.0–100.0)
Monocytes Absolute: 0.5 10*3/uL (ref 0.1–1.0)
Monocytes Relative: 8.9 % (ref 3.0–12.0)
Neutro Abs: 2.5 10*3/uL (ref 1.4–7.7)
Neutrophils Relative %: 45 % (ref 43.0–77.0)
Platelets: 256 10*3/uL (ref 150.0–400.0)
RBC: 3.95 Mil/uL (ref 3.87–5.11)
RDW: 12.6 % (ref 11.5–15.5)
WBC: 5.5 10*3/uL (ref 4.0–10.5)

## 2023-12-01 LAB — TSH: TSH: 2.22 u[IU]/mL (ref 0.35–5.50)

## 2023-12-01 LAB — BASIC METABOLIC PANEL WITH GFR
BUN: 17 mg/dL (ref 6–23)
CO2: 31 meq/L (ref 19–32)
Calcium: 9.6 mg/dL (ref 8.4–10.5)
Chloride: 101 meq/L (ref 96–112)
Creatinine, Ser: 0.65 mg/dL (ref 0.40–1.20)
GFR: 79.69 mL/min (ref >60.00)
Glucose, Bld: 100 mg/dL — ABNORMAL HIGH (ref 70–99)
Potassium: 4.1 meq/L (ref 3.5–5.1)
Sodium: 141 meq/L (ref 135–145)

## 2023-12-01 LAB — LIPID PANEL
Cholesterol: 188 mg/dL (ref 0–200)
HDL: 43.2 mg/dL (ref >39.00)
LDL Cholesterol: 104 mg/dL — ABNORMAL HIGH (ref 0–99)
NonHDL: 144.78
Total CHOL/HDL Ratio: 4
Triglycerides: 204 mg/dL — ABNORMAL HIGH (ref 0.0–149.0)
VLDL: 40.8 mg/dL — ABNORMAL HIGH (ref 0.0–40.0)

## 2023-12-01 LAB — MICROALBUMIN / CREATININE URINE RATIO
Creatinine,U: 174.3 mg/dL
Microalb Creat Ratio: 8.9 mg/g (ref 0.0–30.0)
Microalb, Ur: 1.5 mg/dL (ref 0.0–1.9)

## 2023-12-01 LAB — VITAMIN B12: Vitamin B-12: 207 pg/mL — ABNORMAL LOW (ref 211–911)

## 2023-12-01 LAB — HEMOGLOBIN A1C: Hgb A1c MFr Bld: 5.5 % (ref 4.6–6.5)

## 2023-12-01 LAB — VITAMIN D 25 HYDROXY (VIT D DEFICIENCY, FRACTURES): VITD: 36.06 ng/mL (ref 30.00–100.00)

## 2023-12-06 ENCOUNTER — Ambulatory Visit (INDEPENDENT_AMBULATORY_CARE_PROVIDER_SITE_OTHER): Payer: Medicare Other | Admitting: Internal Medicine

## 2023-12-06 ENCOUNTER — Encounter: Payer: Self-pay | Admitting: Internal Medicine

## 2023-12-06 VITALS — BP 152/78 | HR 70 | Temp 98.1°F | Ht 60.0 in | Wt 142.0 lb

## 2023-12-06 DIAGNOSIS — E7800 Pure hypercholesterolemia, unspecified: Secondary | ICD-10-CM

## 2023-12-06 DIAGNOSIS — E559 Vitamin D deficiency, unspecified: Secondary | ICD-10-CM

## 2023-12-06 DIAGNOSIS — I10 Essential (primary) hypertension: Secondary | ICD-10-CM

## 2023-12-06 DIAGNOSIS — E538 Deficiency of other specified B group vitamins: Secondary | ICD-10-CM

## 2023-12-06 DIAGNOSIS — Z0001 Encounter for general adult medical examination with abnormal findings: Secondary | ICD-10-CM | POA: Diagnosis not present

## 2023-12-06 MED ORDER — HYDROCHLOROTHIAZIDE 25 MG PO TABS: ORAL_TABLET | ORAL | 3 refills | Status: AC

## 2023-12-06 MED ORDER — B-12 1000 MCG SL SUBL: SUBLINGUAL_TABLET | SUBLINGUAL | 11 refills | Status: DC

## 2023-12-06 MED ORDER — LOSARTAN POTASSIUM 50 MG PO TABS: 50.0000 mg | ORAL_TABLET | Freq: Every day | ORAL | 3 refills | Status: DC

## 2023-12-06 NOTE — Patient Instructions (Addendum)
 Ok to start the B12 sublingual  - sent to the pharmacy, but may be OTC as well  Please take the losartan at 50 mg per day  Please continue all other medications as before, and refills have been done if requested.  Please have the pharmacy call with any other refills you may need.  Please continue your efforts at being more active, low cholesterol diet, and weight control.  You are otherwise up to date with prevention measures today.  Please keep your appointments with your specialists as you may have planned  Please make an Appointment to return in 6 months, or sooner if needed

## 2023-12-06 NOTE — Progress Notes (Signed)
 Patient ID: Sierra Robbins, female   DOB: [birth year], 89 y.o.   MRN: [id number]         Chief Complaint:: wellness exam and htn, low b12, low vit d, hld       HPI:  Sierra Robbins is a 89 y.o. female here for wellness exam; up to date                        Also sees pulm Dr Linder Revere yearly, has been taking allergy shots there but no longer and has been seeing other allergy - no shots apparently needed at this follow up feb 2025.  Cxr c/w bronchitis/asthma per pt and states amoxil helped   Does have several wks ongoing nasal allergy symptoms with clearish congestion, itch and sneezing, without fever, pain, ST, cough, swelling or wheezing but controlled with half benadryl so no sedation.  [place]  whiskey helps with cough, but no increased ETOH overall.  Pt denies chest pain, increased sob or doe, wheezing, orthopnea, PND, increased LE swelling, palpitations, dizziness or syncope.  Wt Readings from Last 3 Encounters:  12/06/23 142 lb (64.4 kg)  10/02/23 144 lb (65.3 kg)  06/13/23 142 lb (64.4 kg)   BP Readings from Last 3 Encounters:  12/06/23 (!) 152/78  10/02/23 (!) 158/80  06/07/23 (!) 168/78   Immunization History  Administered Date(s) Administered   Fluad Quad(high Dose 65+) 06/13/2019, 05/27/2020, 05/27/2021, 05/27/2022   H1N1 07/29/2008   Influenza Split 05/29/2012, 05/25/2013, 05/29/2014   Influenza Whole 06/08/2007, 04/29/2008, 06/29/2010, 05/30/2011   Influenza, High Dose Seasonal PF 05/12/2016, 05/25/2017, 05/28/2018   Influenza-Unspecified 05/24/2015, 06/03/2023   PFIZER(Purple Top)SARS-COV-2 Vaccination 01/02/2020, 01/23/2020, 09/09/2020   Pneumococcal Conjugate-13 11/27/2013   Pneumococcal Polysaccharide-23 07/01/2002, 09/16/2008   Td 09/16/2008   Tdap 02/22/2019   Zoster Recombinant(Shingrix) 12/18/2022, 03/13/2023  There are no preventive care reminders to display for this patient.    Past Medical History:  Diagnosis Date   Allergic rhinitis    Asthma    Barrett's  esophagus    COPD (chronic obstructive pulmonary disease) (HCC)    DJD (degenerative joint disease)    hand   Endometriosis    GERD (gastroesophageal reflux disease)    History of shingles    Left upper arm/axilla   Hyperlipidemia    Hypertension    Osteoporosis    Vitamin D deficiency    Past Surgical History:  Procedure Laterality Date   ABDOMINAL HYSTERECTOMY  1979   APPENDECTOMY     CHOLECYSTECTOMY  1995   NASAL SEPTUM SURGERY     TRACHEOSTOMY     s/p temp; at 89 yo for croup   VESICOVAGINAL FISTULA CLOSURE W/ TAH      reports that she has never smoked. She has never used smokeless tobacco. She reports current alcohol use. She reports that she does not use drugs. family history includes Asthma in her sister; Heart attack in her mother; Rheum arthritis in her sister and sister; Stomach cancer in her maternal grandfather; Stroke in her father. Allergies  Allergen Reactions   Alendronate Sodium Other (See Comments)    REACTION: leg cramps   Atorvastatin Other (See Comments)    Myalgia, weakness in legs and arms   Ibandronate Sodium Other (See Comments)    REACTION: gi upset, and sluggish   Prednisone Swelling    Mouth and tongue swelling   Ramipril Swelling    Throat, mouth, and lip swelling   Statins Other (See Comments)  REACTION: myalgias; weakness in legs and arms   Current Outpatient Medications on File Prior to Visit  Medication Sig Dispense Refill   acetaminophen (TYLENOL) 325 MG tablet Take 162.5 mg by mouth every 6 (six) hours as needed for headache (pain).     albuterol (PROAIR HFA) 108 (90 Base) MCG/ACT inhaler inhale 2 puffs by mouth every 4 hours if needed for wheezing and shortness of breath 18 g PRN   amitriptyline (ELAVIL) 25 MG tablet Take 0.5 tablets (12.5 mg total) by mouth at bedtime as needed for sleep. 45 tablet 1   amoxicillin (AMOXIL) 500 MG tablet Take 1 tablet (500 mg total) by mouth 2 (two) times daily. 14 tablet 0   Ascorbic Acid (VITAMIN C  PO) Take 0.5 mg by mouth See admin instructions. Take 1/2 tablet by mouth three to four times monthly.     aspirin EC 81 MG tablet Take 81 mg by mouth daily with breakfast.     Cholecalciferol 50 MCG (2000 UT) TABS 1 tab by mouth once daily 30 tablet 99   Cod Liver Oil 1000 MG CAPS Take 1,000 mg by mouth daily with breakfast.     diphenhydrAMINE (BENADRYL) 25 MG tablet Take 12.5 mg by mouth every 6 (six) hours as needed for itching.     fluticasone (FLONASE) 50 MCG/ACT nasal spray Place 1 spray into both nostrils daily as needed for allergies or rhinitis.     hydrocortisone cream 1 % Apply 1 application topically 2 (two) times daily as needed for itching.     ibuprofen (ADVIL) 200 MG tablet Take 200 mg by mouth every 6 (six) hours as needed for headache (pain).     Multiple Vitamin (MULTIVITAMIN WITH MINERALS) TABS tablet Take 1 tablet by mouth daily with breakfast. Centrum A-Z     vitamin E 400 UNIT capsule Take 400 Units by mouth daily with breakfast.      No current facility-administered medications on file prior to visit.        ROS:  All others reviewed and negative.  Objective        PE:  BP (!) 152/78 (BP Location: Left Arm, Patient Position: Sitting, Cuff Size: Normal)   Pulse 70   Temp 98.1 F (36.7 C) (Oral)   Ht 5' (1.524 m)   Wt 142 lb (64.4 kg)   SpO2 95%   BMI 27.73 kg/m                 Constitutional: Pt appears in NAD               HENT: Head: NCAT.                Right Ear: External ear normal.                 Left Ear: External ear normal.                Eyes: . Pupils are equal, round, and reactive to light. Conjunctivae and EOM are normal               Nose: without d/c or deformity               Neck: Neck supple. Gross normal ROM               Cardiovascular: Normal rate and regular rhythm.                 Pulmonary/Chest: Effort normal and breath sounds  without rales or wheezing.                Abd:  Soft, NT, ND, + BS, no organomegaly                Neurological: Pt is alert. At baseline orientation, motor grossly intact               Skin: Skin is warm. No rashes, no other new lesions, LE edema - none               Psychiatric: Pt behavior is normal without agitation   Micro: none  Cardiac tracings I have personally interpreted today:  none  Pertinent Radiological findings (summarize): none   Lab Results  Component Value Date   WBC 5.5 12/01/2023   HGB 13.7 12/01/2023   HCT 39.8 12/01/2023   PLT 256.0 12/01/2023   GLUCOSE 100 (H) 12/01/2023   CHOL 188 12/01/2023   TRIG 204.0 (H) 12/01/2023   HDL 43.20 12/01/2023   LDLDIRECT 113.0 11/19/2021   LDLCALC 104 (H) 12/01/2023   ALT 13 12/01/2023   AST 21 12/01/2023   NA 141 12/01/2023   K 4.1 12/01/2023   CL 101 12/01/2023   CREATININE 0.65 12/01/2023   BUN 17 12/01/2023   CO2 31 12/01/2023   TSH 2.22 12/01/2023   HGBA1C 5.5 12/01/2023   MICROALBUR 1.5 12/01/2023   Assessment/Plan:  Sierra Robbins is a 89 y.o. White or Caucasian [1] female with  has a past medical history of Allergic rhinitis, Asthma, Barrett's esophagus, COPD (chronic obstructive pulmonary disease) (HCC), DJD (degenerative joint disease), Endometriosis, GERD (gastroesophageal reflux disease), History of shingles, Hyperlipidemia, Hypertension, Osteoporosis, and Vitamin D deficiency.  Encounter for well adult exam with abnormal findings Age and sex appropriate education and counseling updated with regular exercise and diet Referrals for preventative services - none needed Immunizations addressed - none needed Smoking counseling  - none needed Evidence for depression or other mood disorder - none significant Most recent labs reviewed. I have personally reviewed and have noted: 1) the patient's medical and social history 2) The patient's current medications and supplements 3) The patient's height, weight, and BMI have been recorded in the chart   Vitamin D deficiency Last vitamin D Lab Results   Component Value Date   VD25OH 36.06 12/01/2023   Low, to start oral replacement   B12 deficiency Lab Results  Component Value Date   VITAMINB12 207 (L) 12/01/2023   Low, to start oral replacement - b12 1000 mcg qd   Hypertension, uncontrolled BP Readings from Last 3 Encounters:  12/06/23 (!) 152/78  10/02/23 (!) 158/80  06/07/23 (!) 168/78   uncontrolled, pt to continue medical treatment hct 25 every day, but increase losartan 50 mg qd   Hyperlipidemia Lab Results  Component Value Date   LDLCALC 104 (H) 12/01/2023   uncontrolled, pt to continue lower chol diet, declines statin or zetia  Followup: Return in about 6 months (around 06/06/2024).  Rosalia Colonel, MD 12/09/2023 7:13 PM [facility] - [facility]

## 2023-12-09 ENCOUNTER — Encounter: Payer: Self-pay | Admitting: Internal Medicine

## 2023-12-09 NOTE — Assessment & Plan Note (Signed)
 BP Readings from Last 3 Encounters:  12/06/23 (!) 152/78  10/02/23 (!) 158/80  06/07/23 (!) 168/78   uncontrolled, pt to continue medical treatment hct 25 every day, but increase losartan 50 mg qd

## 2023-12-09 NOTE — Assessment & Plan Note (Signed)
 Last vitamin D Lab Results  Component Value Date   VD25OH 36.06 12/01/2023   Low, to start oral replacement

## 2023-12-09 NOTE — Assessment & Plan Note (Signed)

## 2023-12-09 NOTE — Assessment & Plan Note (Signed)
 Lab Results  Component Value Date   VITAMINB12 207 (L) 12/01/2023   Low, to start oral replacement - b12 1000 mcg qd

## 2023-12-09 NOTE — Assessment & Plan Note (Signed)
 Lab Results  Component Value Date   LDLCALC 104 (H) 12/01/2023   uncontrolled, pt to continue lower chol diet, declines statin or zetia

## 2024-01-26 ENCOUNTER — Encounter: Payer: Self-pay | Admitting: Internal Medicine

## 2024-02-07 ENCOUNTER — Ambulatory Visit: Payer: Self-pay

## 2024-02-07 NOTE — Telephone Encounter (Signed)
 FYI Only or Action Required?: FYI only for provider  Patient is followed in Pulmonology for asthma, last seen on 10/02/2023 by Faustina Hood, MD. Called Nurse Triage reporting Cough. Symptoms began yesterday. Interventions attempted: Rescue inhaler and Increased fluids/rest. Symptoms are: unchanged.  Triage Disposition: See HCP Within 4 Hours (Or PCP Triage)  Patient/caregiver understands and will follow disposition?: Yes  Copied from CRM [id number]. Topic: Clinical - Red Word Triage >> Feb 07, 2024  4:48 PM Tyronne Galloway wrote: Red Word that prompted transfer to Nurse Triage: Pt attempting to schedule an appt to see Dr. Linder Revere for a cortisone shot due to having an asthma flair up. Pt stated her voice is hoarse from coughing, she has chest pressure, and she stated she is wheezing. Pt stated it's only been happening recently this week as she hasn't had a flair up in awhile. Pt has a yearly visit set up for 10/01/2024 with Dr. Linder Revere. Reason for Disposition  [1] MILD difficulty breathing (e.g., minimal/no SOB at rest, SOB with walking, pulse <100) AND [2] still present when not coughing  Answer Assessment - Initial Assessment Questions 1. ONSET: When did the cough begin?      Started yesterday 2. SEVERITY: How bad is the cough today?      Moderate 3. SPUTUM: Describe the color of your sputum (none, dry cough; clear, white, yellow, green)     white 4. HEMOPTYSIS: Are you coughing up any blood? If so ask: How much? (flecks, streaks, tablespoons, etc.)     no 5. DIFFICULTY BREATHING: Are you having difficulty breathing? If Yes, ask: How bad is it? (e.g., mild, moderate, severe)    - MILD: No SOB at rest, mild SOB with walking, speaks normally in sentences, can lie down, no retractions, pulse < 100.    - MODERATE: SOB at rest, SOB with minimal exertion and prefers to sit, cannot lie down flat, speaks in phrases, mild retractions, audible wheezing, pulse 100-120.    - SEVERE: Very SOB at rest,  speaks in single words, struggling to breathe, sitting hunched forward, retractions, pulse > 120      Mild shortness of breath when talking 6. FEVER: Do you have a fever? If Yes, ask: What is your temperature, how was it measured, and when did it start?     no 7. CARDIAC HISTORY: Do you have any history of heart disease? (e.g., heart attack, congestive heart failure)      no 8. LUNG HISTORY: Do you have any history of lung disease?  (e.g., pulmonary embolus, asthma, emphysema)     asthma 9. PE RISK FACTORS: Do you have a history of blood clots? (or: recent major surgery, recent prolonged travel, bedridden)     no 10. OTHER SYMPTOMS: Do you have any other symptoms? (e.g., runny nose, wheezing, chest pain)       Wheezing, hoarse 12. TRAVEL: Have you traveled out of the country in the last month? (e.g., travel history, exposures)       No  Patient reports having increased cough with hoarse voice, chest tightness and wheezing since yesterday. Patient recommended to urgent care as no appointment available in pulmonary.  Protocols used: Cough - Acute Productive-A-AH

## 2024-02-14 ENCOUNTER — Other Ambulatory Visit: Payer: Self-pay | Admitting: Internal Medicine

## 2024-06-06 ENCOUNTER — Ambulatory Visit: Admitting: Internal Medicine

## 2024-06-11 ENCOUNTER — Encounter: Payer: Self-pay | Admitting: Internal Medicine

## 2024-06-11 ENCOUNTER — Ambulatory Visit: Admitting: Internal Medicine

## 2024-06-11 VITALS — BP 140/82 | HR 76 | Temp 98.3°F | Ht 60.0 in | Wt 147.0 lb

## 2024-06-11 DIAGNOSIS — E538 Deficiency of other specified B group vitamins: Secondary | ICD-10-CM | POA: Diagnosis not present

## 2024-06-11 DIAGNOSIS — I10 Essential (primary) hypertension: Secondary | ICD-10-CM | POA: Diagnosis not present

## 2024-06-11 DIAGNOSIS — E7800 Pure hypercholesterolemia, unspecified: Secondary | ICD-10-CM

## 2024-06-11 DIAGNOSIS — R739 Hyperglycemia, unspecified: Secondary | ICD-10-CM

## 2024-06-11 DIAGNOSIS — E559 Vitamin D deficiency, unspecified: Secondary | ICD-10-CM

## 2024-06-11 MED ORDER — LOSARTAN POTASSIUM 100 MG PO TABS: 100.0000 mg | ORAL_TABLET | Freq: Every day | ORAL | 3 refills | Status: AC

## 2024-06-11 NOTE — Patient Instructions (Signed)
 Ok to increase the losartan  to 100 mg per day  Please have your RSV shot done at the pharmacy as you mentioned  Please continue all other medications as before, and refills have been done if requested.  Please have the pharmacy call with any other refills you may need.  Please continue your efforts at being more active, low cholesterol diet, and weight control.  Please keep your appointments with your specialists as you may have planned  We can hold on lab test today  Please make an Appointment to return in 6 months, or sooner if needed, also with Lab Appointment for testing done 3-5 days before at the FIRST FLOOR Lab (so this is for TWO appointments - please see the scheduling desk as you leave)

## 2024-06-11 NOTE — Assessment & Plan Note (Signed)
 Lab Results  Component Value Date   LDLCALC 104 (H) 12/01/2023   Uncontrolled, pt declines statin or repatha for now, now for lower chol diet

## 2024-06-11 NOTE — Assessment & Plan Note (Signed)
 BP Readings from Last 3 Encounters:  06/11/24 (!) 140/82  12/06/23 (!) 152/78  10/02/23 (!) 158/80   uncontrolled, pt to continue medical treatment hct 25 every day but for increased losartan  to 100 mg every day, declines lab today

## 2024-06-11 NOTE — Assessment & Plan Note (Signed)
 Last vitamin D Lab Results  Component Value Date   VD25OH 36.06 12/01/2023   Low, to start oral replacement

## 2024-06-11 NOTE — Progress Notes (Signed)
 Patient ID: EZELL MELIKIAN, female   DOB: 1934/09/29, 89 y.o.   MRN: [id number]        Chief Complaint: follow up HTN, HLD and hyperglycemia, low vit d       HPI:  SHAMERIA TRIMARCO is a 89 y.o. female here overall doing quite nicely.  Pt denies chest pain, increased sob or doe, wheezing, orthopnea, PND, increased LE swelling, palpitations, dizziness or syncope.   Pt denies polydipsia, polyuria, or new focal neuro s/s.    Pt denies fever, wt loss, night sweats, loss of appetite, or other constitutional symptoms   Had flu shot last wk.  Plans to return for RSV shot soon.   BP has been up to 165 sbp home this am.  Did not tolerate b12 sublingual as seemed to cause nausea.  Declines B12 shots or other.   Wt Readings from Last 3 Encounters:  06/11/24 147 lb (66.7 kg)  12/06/23 142 lb (64.4 kg)  10/02/23 144 lb (65.3 kg)   BP Readings from Last 3 Encounters:  06/11/24 (!) 140/82  12/06/23 (!) 152/78  10/02/23 (!) 158/80         Past Medical History:  Diagnosis Date   Allergic rhinitis    Asthma    Barrett's esophagus    COPD (chronic obstructive pulmonary disease) (HCC)    DJD (degenerative joint disease)    hand   Endometriosis    GERD (gastroesophageal reflux disease)    History of shingles    Left upper arm/axilla   Hyperlipidemia    Hypertension    Osteoporosis    Vitamin D  deficiency    Past Surgical History:  Procedure Laterality Date   ABDOMINAL HYSTERECTOMY  1979   APPENDECTOMY     CHOLECYSTECTOMY  1995   NASAL SEPTUM SURGERY     TRACHEOSTOMY     s/p temp; at 88 yo for croup   VESICOVAGINAL FISTULA CLOSURE W/ TAH      reports that she has never smoked. She has never used smokeless tobacco. She reports current alcohol use. She reports that she does not use drugs. family history includes Asthma in her sister; Heart attack in her mother; Rheum arthritis in her sister and sister; Stomach cancer in her maternal grandfather; Stroke in her father. Allergies  Allergen Reactions    Alendronate Sodium Other (See Comments)    REACTION: leg cramps   Atorvastatin Other (See Comments)    Myalgia, weakness in legs and arms   Ibandronate Sodium Other (See Comments)    REACTION: gi upset, and sluggish   Prednisone Swelling    Mouth and tongue swelling   Ramipril Swelling    Throat, mouth, and lip swelling   Statins Other (See Comments)    REACTION: myalgias; weakness in legs and arms   Current Outpatient Medications on File Prior to Visit  Medication Sig Dispense Refill   acetaminophen (TYLENOL) 325 MG tablet Take 162.5 mg by mouth every 6 (six) hours as needed for headache (pain).     albuterol  (PROAIR  HFA) 108 (90 Base) MCG/ACT inhaler inhale 2 puffs by mouth every 4 hours if needed for wheezing and shortness of breath 18 g PRN   amitriptyline  (ELAVIL ) 25 MG tablet Take 0.5 tablets (12.5 mg total) by mouth at bedtime as needed for sleep. 45 tablet 1   amoxicillin  (AMOXIL ) 500 MG tablet TAKE 1 TABLET(500 MG) BY MOUTH TWICE DAILY 14 tablet 0   Ascorbic Acid (VITAMIN C PO) Take 0.5 mg by mouth See  admin instructions. Take 1/2 tablet by mouth three to four times monthly.     aspirin EC 81 MG tablet Take 81 mg by mouth daily with breakfast.     Cholecalciferol  50 MCG (2000 UT) TABS 1 tab by mouth once daily 30 tablet 99   Cod Liver Oil 1000 MG CAPS Take 1,000 mg by mouth daily with breakfast.     diphenhydrAMINE (BENADRYL) 25 MG tablet Take 12.5 mg by mouth every 6 (six) hours as needed for itching.     fluticasone  (FLONASE ) 50 MCG/ACT nasal spray Place 1 spray into both nostrils daily as needed for allergies or rhinitis.     hydrochlorothiazide  (HYDRODIURIL ) 25 MG tablet 1 tab by mouth once daily 90 tablet 3   hydrocortisone cream 1 % Apply 1 application topically 2 (two) times daily as needed for itching.     ibuprofen (ADVIL) 200 MG tablet Take 200 mg by mouth every 6 (six) hours as needed for headache (pain).     Multiple Vitamin (MULTIVITAMIN WITH MINERALS) TABS tablet  Take 1 tablet by mouth daily with breakfast. Centrum A-Z     vitamin E 400 UNIT capsule Take 400 Units by mouth daily with breakfast.      No current facility-administered medications on file prior to visit.        ROS:  All others reviewed and negative.  Objective        PE:  BP (!) 140/82 (BP Location: Right Arm, Patient Position: Sitting, Cuff Size: Normal)   Pulse 76   Temp 98.3 F (36.8 C) (Oral)   Ht 5' (1.524 m)   Wt 147 lb (66.7 kg)   SpO2 97%   BMI 28.71 kg/m                 Constitutional: Pt appears in NAD               HENT: Head: NCAT.                Right Ear: External ear normal.                 Left Ear: External ear normal.                Eyes: . Pupils are equal, round, and reactive to light. Conjunctivae and EOM are normal               Nose: without d/c or deformity               Neck: Neck supple. Gross normal ROM               Cardiovascular: Normal rate and regular rhythm.                 Pulmonary/Chest: Effort normal and breath sounds without rales or wheezing.                Abd:  Soft, NT, ND, + BS, no organomegaly               Neurological: Pt is alert. At baseline orientation, motor grossly intact               Skin: Skin is warm. No rashes, no other new lesions, LE edema - none               Psychiatric: Pt behavior is normal without agitation   Micro: none  Cardiac tracings I have personally interpreted today:  none  Pertinent  Radiological findings (summarize): none   Lab Results  Component Value Date   WBC 5.5 12/01/2023   HGB 13.7 12/01/2023   HCT 39.8 12/01/2023   PLT 256.0 12/01/2023   GLUCOSE 100 (H) 12/01/2023   CHOL 188 12/01/2023   TRIG 204.0 (H) 12/01/2023   HDL 43.20 12/01/2023   LDLDIRECT 113.0 11/19/2021   LDLCALC 104 (H) 12/01/2023   ALT 13 12/01/2023   AST 21 12/01/2023   NA 141 12/01/2023   K 4.1 12/01/2023   CL 101 12/01/2023   CREATININE 0.65 12/01/2023   BUN 17 12/01/2023   CO2 31 12/01/2023   TSH 2.22  12/01/2023   HGBA1C 5.5 12/01/2023   MICROALBUR 1.5 12/01/2023   Assessment/Plan:  CLYTEE HEINRICH is a 89 y.o. White or Caucasian [1] female with  has a past medical history of Allergic rhinitis, Asthma, Barrett's esophagus, COPD (chronic obstructive pulmonary disease) (HCC), DJD (degenerative joint disease), Endometriosis, GERD (gastroesophageal reflux disease), History of shingles, Hyperlipidemia, Hypertension, Osteoporosis, and Vitamin D  deficiency.  B12 deficiency Incontrolled ,  Did not tolerate oral b12 and does not want IM shots;  to f/u any worsening symptoms or concerns  Hyperglycemia Lab Results  Component Value Date   HGBA1C 5.5 12/01/2023   Stable, pt to continue current medical treatment  - diet, wt control    Hyperlipidemia Lab Results  Component Value Date   LDLCALC 104 (H) 12/01/2023   Uncontrolled, pt declines statin or repatha for now, now for lower chol diet   Hypertension, uncontrolled BP Readings from Last 3 Encounters:  06/11/24 (!) 140/82  12/06/23 (!) 152/78  10/02/23 (!) 158/80   uncontrolled, pt to continue medical treatment hct 25 every day but for increased losartan  to 100 mg every day, declines lab today   Vitamin D  deficiency Last vitamin D  Lab Results  Component Value Date   VD25OH 36.06 12/01/2023   Low, to start oral replacement  Followup: Return in about 6 months (around 12/10/2024).  Lynwood Rush, MD 06/11/2024 12:51 PM  [facility]

## 2024-06-11 NOTE — Assessment & Plan Note (Signed)
 Incontrolled ,  Did not tolerate oral b12 and does not want IM shots;  to f/u any worsening symptoms or concerns

## 2024-06-11 NOTE — Assessment & Plan Note (Signed)
 Lab Results  Component Value Date   HGBA1C 5.5 12/01/2023   Stable, pt to continue current medical treatment  - diet, wt control

## 2024-06-11 NOTE — Addendum Note (Signed)
 Addended by: NORLEEN LYNWOOD ORN on: 06/11/2024 12:53 PM   Modules accepted: Orders

## 2024-06-13 ENCOUNTER — Ambulatory Visit: Payer: Medicare Other

## 2024-06-13 VITALS — Ht 60.0 in | Wt 147.0 lb

## 2024-06-13 DIAGNOSIS — Z0000 Encounter for general adult medical examination without abnormal findings: Secondary | ICD-10-CM | POA: Diagnosis not present

## 2024-06-13 NOTE — Progress Notes (Signed)
 Subjective:   Sierra Robbins is a 89 y.o. who presents for a Medicare Wellness preventive visit.  As a reminder, Annual Wellness Visits don't include a physical exam, and some assessments may be limited, especially if this visit is performed virtually. We may recommend an in-person follow-up visit with your provider if needed.  Visit Complete: Virtual I connected with  Ronal JAYSON Louder on 06/13/24 by a audio enabled telemedicine application and verified that I am speaking with the correct person using two identifiers.  Patient Location: Home  Provider Location: Office/Clinic  I discussed the limitations of evaluation and management by telemedicine. The patient expressed understanding and agreed to proceed.  Vital Signs: Because this visit was a virtual/telehealth visit, some criteria may be missing or patient reported. Any vitals not documented were not able to be obtained and vitals that have been documented are patient reported.  VideoDeclined- This patient declined Librarian, academic. Therefore the visit was completed with audio only.  Persons Participating in Visit: Patient.  AWV Questionnaire: No: Patient Medicare AWV questionnaire was not completed prior to this visit.  Cardiac Risk Factors include: advanced age (>53men, >59 women);dyslipidemia;hypertension     Objective:    Today's Vitals   06/13/24 1313  Weight: 147 lb (66.7 kg)  Height: 5' (1.524 m)   Body mass index is 28.71 kg/m.     06/13/2024    1:13 PM 06/13/2023    1:07 PM 06/06/2022    1:05 PM 06/04/2021    9:31 AM 02/25/2020   10:15 AM 11/08/2019    8:22 PM  Advanced Directives  Does Patient Have a Medical Advance Directive? Yes Yes Yes Yes Yes No  Type of Estate agent of [place];Living will Healthcare Power of [place];Living will Healthcare Power of [place];Living will Living will;Healthcare Power of [employer] Power of [place];Living will    Does patient want to make changes to medical advance directive? No - Patient declined   No - Patient declined No - Patient declined   Copy of Healthcare Power of Attorney in Chart? Yes - validated most recent copy scanned in chart (See row information) No - copy requested No - copy requested  No - copy requested   Would patient like information on creating a medical advance directive?      No - Patient declined    Current Medications (verified) Outpatient Encounter Medications as of 06/13/2024  Medication Sig   acetaminophen (TYLENOL) 325 MG tablet Take 162.5 mg by mouth every 6 (six) hours as needed for headache (pain).   albuterol  (PROAIR  HFA) 108 (90 Base) MCG/ACT inhaler inhale 2 puffs by mouth every 4 hours if needed for wheezing and shortness of breath   amitriptyline  (ELAVIL ) 25 MG tablet Take 0.5 tablets (12.5 mg total) by mouth at bedtime as needed for sleep.   amoxicillin  (AMOXIL ) 500 MG tablet TAKE 1 TABLET(500 MG) BY MOUTH TWICE DAILY   Ascorbic Acid (VITAMIN C PO) Take 0.5 mg by mouth See admin instructions. Take 1/2 tablet by mouth three to four times monthly.   aspirin EC 81 MG tablet Take 81 mg by mouth daily with breakfast.   Cholecalciferol  50 MCG (2000 UT) TABS 1 tab by mouth once daily   Cod Liver Oil 1000 MG CAPS Take 1,000 mg by mouth daily with breakfast.   diphenhydrAMINE (BENADRYL) 25 MG tablet Take 12.5 mg by mouth every 6 (six) hours as needed for itching.   fluticasone  (FLONASE ) 50 MCG/ACT nasal spray Place  1 spray into both nostrils daily as needed for allergies or rhinitis.   hydrochlorothiazide  (HYDRODIURIL ) 25 MG tablet 1 tab by mouth once daily   hydrocortisone cream 1 % Apply 1 application topically 2 (two) times daily as needed for itching.   ibuprofen (ADVIL) 200 MG tablet Take 200 mg by mouth every 6 (six) hours as needed for headache (pain).   losartan  (COZAAR ) 100 MG tablet Take 1 tablet (100 mg total) by mouth daily.   Multiple Vitamin (MULTIVITAMIN  WITH MINERALS) TABS tablet Take 1 tablet by mouth daily with breakfast. Centrum A-Z   vitamin E 400 UNIT capsule Take 400 Units by mouth daily with breakfast.    No facility-administered encounter medications on file as of 06/13/2024.    Allergies (verified) Alendronate sodium, Atorvastatin, Ibandronate sodium, Prednisone, Ramipril, and Statins   History: Past Medical History:  Diagnosis Date   Allergic rhinitis    Asthma    Barrett's esophagus    COPD (chronic obstructive pulmonary disease) (HCC)    DJD (degenerative joint disease)    hand   Endometriosis    GERD (gastroesophageal reflux disease)    History of shingles    Left upper arm/axilla   Hyperlipidemia    Hypertension    Osteoporosis    Vitamin D  deficiency    Past Surgical History:  Procedure Laterality Date   ABDOMINAL HYSTERECTOMY  1979   APPENDECTOMY     CHOLECYSTECTOMY  1995   NASAL SEPTUM SURGERY     TRACHEOSTOMY     s/p temp; at 89 yo for croup   VESICOVAGINAL FISTULA CLOSURE W/ TAH     Family History  Problem Relation Age of Onset   Stroke Father    Heart attack Mother    Asthma Sister    Stomach cancer Maternal Grandfather    Rheum arthritis Sister    Rheum arthritis Sister    Social History   Socioeconomic History   Marital status: Divorced    Spouse name: Not on file   Number of children: 1   Years of education: Not on file   Highest education level: Not on file  Occupational History   Occupation: [employer]    Employer: [employer]  Tobacco Use   Smoking status: Never   Smokeless tobacco: Never  Vaping Use   Vaping status: Never Used  Substance and Sexual Activity   Alcohol use: Yes    Comment: during the holidays   Drug use: No   Sexual activity: Not Currently  Other Topics Concern   Not on file  Social History Narrative   Not on file   Social Drivers of Health   Financial Resource Strain: Low Risk  (06/13/2024)   Overall Financial Resource Strain (CARDIA)     Difficulty of Paying Living Expenses: Not hard at all  Food Insecurity: No Food Insecurity (06/13/2024)   Hunger Vital Sign    Worried About Running Out of Food in the Last Year: Never true    Ran Out of Food in the Last Year: Never true  Transportation Needs: No Transportation Needs (06/13/2024)   PRAPARE - Administrator, Civil Service (Medical): No    Lack of Transportation (Non-Medical): No  Physical Activity: Sufficiently Active (06/13/2024)   Exercise Vital Sign    Days of Exercise per Week: 7 days    Minutes of Exercise per Session: 30 min  Stress: No Stress Concern Present (06/13/2024)   [employer] of Occupational Health - Occupational Stress  Questionnaire    Feeling of Stress: Not at all  Social Connections: Moderately Integrated (06/13/2024)   Social Connection and Isolation Panel    Frequency of Communication with Friends and Family: More than three times a week    Frequency of Social Gatherings with Friends and Family: More than three times a week    Attends Religious Services: More than 4 times per year    Active Member of [employer] or Organizations: Yes    Attends Banker Meetings: 1 to 4 times per year    Marital Status: Widowed    Tobacco Counseling Counseling given: Not Answered    Clinical Intake:  Pre-visit preparation completed: Yes  Pain : No/denies pain     BMI - recorded: 28.71 Nutritional Status: BMI 25 -29 Overweight Nutritional Risks: None Diabetes: No  Lab Results  Component Value Date   HGBA1C 5.5 12/01/2023   HGBA1C 5.4 11/28/2022   HGBA1C 5.4 11/24/2020     How often do you need to have someone help you when you read instructions, pamphlets, or other written materials from your doctor or pharmacy?: 1 - Never  Interpreter Needed?: No  Information entered by :: Verdie Saba, CMA   Activities of Daily Living     06/13/2024    1:16 PM  In your present state of health, do you have any difficulty  performing the following activities:  Hearing? 0  Vision? 0  Difficulty concentrating or making decisions? 0  Walking or climbing stairs? 0  Dressing or bathing? 0  Doing errands, shopping? 0  Preparing Food and eating ? N  Using the Toilet? N  In the past six months, have you accidently leaked urine? N  Do you have problems with loss of bowel control? N  Managing your Medications? N  Managing your Finances? N  Housekeeping or managing your Housekeeping? N    Patient Care Team: Norleen Lynwood ORN, MD as PCP - Diedre Robinson Idol, MD as Consulting Physician (Ophthalmology)  I have updated your Care Teams any recent Medical Services you may have received from other providers in the past year.     Assessment:   This is a routine wellness examination for [facility].  Hearing/Vision screen Hearing Screening - Comments:: Denies hearing difficulties   Vision Screening - Comments:: Wears rx glasses - up to date with routine eye exams with [facility]   Goals Addressed               This Visit's Progress     Patient Stated (pt-stated)        Patient stated she continues to stay busy - mowing grass/raking leaves       Depression Screen     06/13/2024    1:18 PM 06/11/2024   10:44 AM 06/11/2024   10:40 AM 12/06/2023   10:01 AM 12/06/2023   10:00 AM 06/13/2023    1:09 PM 06/07/2023    9:56 AM  PHQ 2/9 Scores  PHQ - 2 Score 0 0 0 0 0 0 0  PHQ- 9 Score 0     0 0    Fall Risk     06/13/2024    1:17 PM 06/11/2024   10:44 AM 12/06/2023   10:04 AM 06/13/2023    1:09 PM 06/07/2023    9:56 AM  Fall Risk   Falls in the past year? 0 0 0 0 0  Number falls in past yr: 0 0 0 0 0  Injury with Fall? 0 0  0 0 0  Risk for fall due to : No Fall Risks No Fall Risks No Fall Risks No Fall Risks No Fall Risks  Follow up Falls evaluation completed;Falls prevention discussed Falls evaluation completed Falls evaluation completed Falls prevention discussed Falls evaluation completed    MEDICARE  RISK AT HOME:  Medicare Risk at Home Any stairs in or around the home?: No If so, are there any without handrails?: No Home free of loose throw rugs in walkways, pet beds, electrical cords, etc?: Yes Adequate lighting in your home to reduce risk of falls?: Yes Life alert?: No Use of a cane, walker or w/c?: No Grab bars in the bathroom?: Yes Shower chair or bench in shower?: No Elevated toilet seat or a handicapped toilet?: Yes  TIMED UP AND GO:  Was the test performed?  No  Cognitive Function: 6CIT completed    06/13/2023    1:14 PM 02/25/2020   10:17 AM  MMSE - Mini Mental State Exam  Not completed: Unable to complete Refused        06/13/2024    1:21 PM 06/13/2023    1:12 PM 06/06/2022    1:08 PM  6CIT Screen  What Year? 0 points 0 points 0 points  What month? 0 points 0 points 0 points  What time? 0 points 0 points 0 points  Count back from 20 0 points 0 points 0 points  Months in reverse 0 points 0 points 0 points  Repeat phrase 2 points 0 points 0 points  Total Score 2 points 0 points 0 points    Immunizations Immunization History  Administered Date(s) Administered   Fluad Quad(high Dose 65+) 06/13/2019, 05/27/2020, 05/27/2021, 05/27/2022, 06/04/2024   H1N1 07/29/2008   INFLUENZA, HIGH DOSE SEASONAL PF 05/12/2016, 05/25/2017, 05/28/2018   Influenza Split 05/29/2012, 05/25/2013, 05/29/2014   Influenza Whole 06/08/2007, 04/29/2008, 06/29/2010, 05/30/2011   Influenza-Unspecified 05/24/2015, 06/03/2023   PFIZER(Purple Top)SARS-COV-2 Vaccination 01/02/2020, 01/23/2020, 09/09/2020   Pneumococcal Conjugate-13 11/27/2013   Pneumococcal Polysaccharide-23 07/01/2002, 09/16/2008   Td 09/16/2008   Tdap 02/22/2019   Zoster Recombinant(Shingrix) 12/18/2022, 03/13/2023    Screening Tests Health Maintenance  Topic Date Due   Medicare Annual Wellness (AWV)  06/13/2025   DTaP/Tdap/Td (3 - Td or Tdap) 02/21/2029   Pneumococcal Vaccine: 50+ Years  Completed   Influenza  Vaccine  Completed   DEXA SCAN  Completed   Zoster Vaccines- Shingrix  Completed   Meningococcal B Vaccine  Aged Out   COVID-19 Vaccine  Discontinued    Health Maintenance Items Addressed: 06/13/2024  Additional Screening:  Vision Screening: Recommended annual ophthalmology exams for early detection of glaucoma and other disorders of the eye. Is the patient up to date with their annual eye exam?  Yes  Who is the provider or what is the name of the office in which the patient attends annual eye exams? [facility]  Dental Screening: Recommended annual dental exams for proper oral hygiene  Community Resource Referral / Chronic Care Management: CRR required this visit?  No   CCM required this visit?  No   Plan:    I have personally reviewed and noted the following in the patient's chart:   Medical and social history Use of alcohol, tobacco or illicit drugs  Current medications and supplements including opioid prescriptions. Patient is not currently taking opioid prescriptions. Functional ability and status Nutritional status Physical activity Advanced directives List of other physicians Hospitalizations, surgeries, and ER visits in previous 12 months Vitals Screenings to include cognitive,  depression, and falls Referrals and appointments  In addition, I have reviewed and discussed with patient certain preventive protocols, quality metrics, and best practice recommendations. A written personalized care plan for preventive services as well as general preventive health recommendations were provided to patient.   Verdie CHRISTELLA Saba, CMA   06/13/2024   After Visit Summary: (Declined) Due to this being a telephonic visit, with patients personalized plan was offered to patient but patient Declined AVS at this time   Notes: Nothing significant to report at this time.

## 2024-06-13 NOTE — Patient Instructions (Addendum)
 Sierra Robbins,  Thank you for taking the time for your Medicare Wellness Visit. I appreciate your continued commitment to your health goals. Please review the care plan we discussed, and feel free to reach out if I can assist you further.  Medicare recommends these wellness visits once per year to help you and your care team stay ahead of potential health issues. These visits are designed to focus on prevention, allowing your provider to concentrate on managing your acute and chronic conditions during your regular appointments.  Please note that Annual Wellness Visits do not include a physical exam. Some assessments may be limited, especially if the visit was conducted virtually. If needed, we may recommend a separate in-person follow-up with your provider.  Ongoing Care Seeing your primary care provider every 3 to 6 months helps us  monitor your health and provide consistent, personalized care.   Referrals If a referral was made during today's visit and you haven't received any updates within two weeks, please contact the referred provider directly to check on the status.  Recommended Screenings:  Health Maintenance  Topic Date Due   Medicare Annual Wellness Visit  06/13/2025   DTaP/Tdap/Td vaccine (3 - Td or Tdap) 02/21/2029   Pneumococcal Vaccine for age over 56  Completed   Flu Shot  Completed   DEXA scan (bone density measurement)  Completed   Zoster (Shingles) Vaccine  Completed   Meningitis B Vaccine  Aged Out   COVID-19 Vaccine  Discontinued       06/13/2024    1:13 PM  Advanced Directives  Does Patient Have a Medical Advance Directive? Yes  Type of Estate agent of [place];Living will  Does patient want to make changes to medical advance directive? No - Patient declined  Copy of Healthcare Power of Attorney in Chart? Yes - validated most recent copy scanned in chart (See row information)   Advance Care Planning is important because it: Ensures you  receive medical care that aligns with your values, goals, and preferences. Provides guidance to your family and loved ones, reducing the emotional burden of decision-making during critical moments.  Vision: Annual vision screenings are recommended for early detection of glaucoma, cataracts, and diabetic retinopathy. These exams can also reveal signs of chronic conditions such as diabetes and high blood pressure.  Dental: Annual dental screenings help detect early signs of oral cancer, gum disease, and other conditions linked to overall health, including heart disease and diabetes.

## 2024-09-03 ENCOUNTER — Encounter: Payer: Self-pay | Admitting: Internal Medicine

## 2024-10-01 ENCOUNTER — Ambulatory Visit: Payer: Medicare Other | Admitting: Internal Medicine

## 2024-10-01 ENCOUNTER — Encounter: Admitting: Pulmonary Disease

## 2024-11-13 ENCOUNTER — Encounter: Admitting: Pulmonary Disease

## 2024-11-19 ENCOUNTER — Encounter: Admitting: Family Medicine

## 2024-12-02 ENCOUNTER — Other Ambulatory Visit

## 2024-12-10 ENCOUNTER — Ambulatory Visit: Admitting: Internal Medicine

## 2025-06-17 ENCOUNTER — Ambulatory Visit

## 2025-06-17 ENCOUNTER — Encounter: Admitting: Internal Medicine
